# Patient Record
Sex: Male | Born: 1985 | Race: Black or African American | Hispanic: No | Marital: Single
Health system: Southern US, Academic
[De-identification: ages and names within clinical notes are randomized; demographics above are authoritative.]

## PROBLEM LIST (undated history)

## (undated) ENCOUNTER — Encounter

## (undated) ENCOUNTER — Ambulatory Visit: Payer: MEDICARE

## (undated) ENCOUNTER — Telehealth

## (undated) ENCOUNTER — Ambulatory Visit

## (undated) ENCOUNTER — Encounter: Attending: Registered" | Primary: Registered"

## (undated) ENCOUNTER — Encounter: Attending: Vascular & Interventional Radiology | Primary: Vascular & Interventional Radiology

## (undated) ENCOUNTER — Ambulatory Visit: Payer: MEDICARE | Attending: Registered" | Primary: Registered"

## (undated) ENCOUNTER — Telehealth
Attending: Student in an Organized Health Care Education/Training Program | Primary: Student in an Organized Health Care Education/Training Program

## (undated) ENCOUNTER — Encounter: Attending: Adult Health | Primary: Adult Health

## (undated) ENCOUNTER — Other Ambulatory Visit

## (undated) ENCOUNTER — Encounter: Attending: Surgery | Primary: Surgery

## (undated) ENCOUNTER — Ambulatory Visit: Payer: Medicare (Managed Care)

## (undated) ENCOUNTER — Inpatient Hospital Stay

## (undated) ENCOUNTER — Encounter: Attending: Anesthesiology | Primary: Anesthesiology

## (undated) ENCOUNTER — Telehealth: Attending: Dentist | Primary: Dentist

## (undated) ENCOUNTER — Encounter: Payer: MEDICARE | Attending: Otolaryngology | Primary: Otolaryngology

## (undated) ENCOUNTER — Encounter
Attending: Student in an Organized Health Care Education/Training Program | Primary: Student in an Organized Health Care Education/Training Program

## (undated) ENCOUNTER — Telehealth: Attending: Registered Nurse | Primary: Registered Nurse

## (undated) ENCOUNTER — Encounter
Attending: Pharmacist Clinician (PhC)/ Clinical Pharmacy Specialist | Primary: Pharmacist Clinician (PhC)/ Clinical Pharmacy Specialist

## (undated) ENCOUNTER — Ambulatory Visit: Payer: MEDICARE | Attending: Surgery | Primary: Surgery

## (undated) ENCOUNTER — Encounter: Attending: Acute Care | Primary: Acute Care

## (undated) ENCOUNTER — Ambulatory Visit: Payer: Medicare (Managed Care) | Attending: Hospice and Palliative Medicine | Primary: Hospice and Palliative Medicine

## (undated) DIAGNOSIS — F840 Autistic disorder: Secondary | ICD-10-CM

## (undated) DIAGNOSIS — R569 Unspecified convulsions: Secondary | ICD-10-CM

## (undated) DIAGNOSIS — K219 Gastro-esophageal reflux disease without esophagitis: Secondary | ICD-10-CM

## (undated) HISTORY — PX: CIRCUMCISION: SUR203

## (undated) HISTORY — DX: Autistic disorder: F84.0

## (undated) HISTORY — DX: Unspecified convulsions: R56.9

---
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
## 2007-02-10 ENCOUNTER — Ambulatory Visit: Payer: Self-pay | Admitting: Dentistry

## 2007-02-17 ENCOUNTER — Ambulatory Visit: Payer: Self-pay | Admitting: Dentistry

## 2013-04-02 DIAGNOSIS — G4700 Insomnia, unspecified: Secondary | ICD-10-CM | POA: Insufficient documentation

## 2013-04-02 DIAGNOSIS — F71 Moderate intellectual disabilities: Secondary | ICD-10-CM | POA: Insufficient documentation

## 2013-04-02 DIAGNOSIS — F840 Autistic disorder: Secondary | ICD-10-CM

## 2013-04-02 DIAGNOSIS — F6381 Intermittent explosive disorder: Secondary | ICD-10-CM | POA: Insufficient documentation

## 2013-04-02 DIAGNOSIS — G40309 Generalized idiopathic epilepsy and epileptic syndromes, not intractable, without status epilepticus: Secondary | ICD-10-CM | POA: Insufficient documentation

## 2013-04-02 DIAGNOSIS — G40209 Localization-related (focal) (partial) symptomatic epilepsy and epileptic syndromes with complex partial seizures, not intractable, without status epilepticus: Secondary | ICD-10-CM

## 2013-05-07 ENCOUNTER — Telehealth: Payer: Self-pay

## 2013-05-07 DIAGNOSIS — G40309 Generalized idiopathic epilepsy and epileptic syndromes, not intractable, without status epilepticus: Secondary | ICD-10-CM

## 2013-05-07 MED ORDER — FELBATOL 600 MG/5ML PO SUSP: ORAL | Status: DC

## 2013-05-07 NOTE — Telephone Encounter (Signed)
I called Darlene and let her know.

## 2013-05-07 NOTE — Telephone Encounter (Signed)
Darlene lvm at 10:17 am inquiring about the Rx refill. I will call mom when complete [phone].

## 2013-05-07 NOTE — Telephone Encounter (Signed)
Please let Mom know that the Rx has been faxed to the pharmacy. Thanks, Inetta Fermo

## 2013-05-18 ENCOUNTER — Encounter: Payer: Self-pay | Admitting: Pediatrics

## 2013-05-18 ENCOUNTER — Ambulatory Visit (INDEPENDENT_AMBULATORY_CARE_PROVIDER_SITE_OTHER): Payer: Medicaid Other | Admitting: Pediatrics

## 2013-05-18 VITALS — BP 84/64 | HR 72 | Ht 64.0 in | Wt 120.0 lb

## 2013-05-18 DIAGNOSIS — F71 Moderate intellectual disabilities: Secondary | ICD-10-CM

## 2013-05-18 DIAGNOSIS — G40209 Localization-related (focal) (partial) symptomatic epilepsy and epileptic syndromes with complex partial seizures, not intractable, without status epilepticus: Secondary | ICD-10-CM

## 2013-05-18 DIAGNOSIS — F840 Autistic disorder: Secondary | ICD-10-CM

## 2013-05-18 DIAGNOSIS — G4700 Insomnia, unspecified: Secondary | ICD-10-CM

## 2013-05-18 DIAGNOSIS — G40309 Generalized idiopathic epilepsy and epileptic syndromes, not intractable, without status epilepticus: Secondary | ICD-10-CM

## 2013-05-18 MED ORDER — HALOPERIDOL 2 MG PO TABS: ORAL_TABLET | ORAL | Status: DC

## 2013-05-18 MED ORDER — FELBATOL 600 MG/5ML PO SUSP: ORAL | Status: DC

## 2013-05-18 NOTE — Progress Notes (Signed)
Patient: Jonathan Kirby MRN: [id number] Sex: male DOB: [birth year]  Provider: Deetta Perla, MD Location of Care: [facility]  Note type: Routine return visit  History of Present Illness: Referral Source: Dr. [name] History from: both parents and [facility] chart Chief Complaint: Seizures/Autism  Jonathan Kirby is a 27 y.o. male who returns for evaluation and management of a mixed seizure type, and undifferentiated autism.  He was seen on May 18, 2013, for the first time since June 09, 2012.  I see him on a yearly basis.  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  Since his last visit he had two seizures in the past year.  Both were associated with forgetting to give his medication in a mix-up in communication between his parents.  Overall his health has been good.  His appetite is fair.  He is not a picky eater, but he eats very little.  He feeds himself.  He can take care of toileting and  dressing himself.  He has problems and has always had difficulty with sleep.  He goes to bed between 9:30 and 10, but often will not go to sleep until much later.  Nonetheless he stays in his bed and does not disturb his parents.  He is up between 5:30 and 6.  He has very significant constipation and does not have bowel movements any more often than once a week.  His mother gives him DocuSol, an over-the-counter suppository, that helps him have bowel movements.  This is the only way that he eliminates.  Otherwise, his health has been good.  We were not able to measure his height and weight today.  So I do not really know how it has changed, but his parents say that he has changed very little.  He takes and tolerates Felbatol quite well.  He has been on this medicine for over 20 years without any adverse effects.   We long since stopped obtaining drug levels because his seizure control has been excellent.  Neither his parents nor I are willing to attempt to taper or discontinue his medication.  Review of Systems: 12 system review was remarkable for constipation and seizure  Past Medical History  Diagnosis Date  . Seizures   . Autism    Hospitalizations: no, Head Injury: no, Nervous System Infections: no, Immunizations up to date: yes  Past Medical History Comments: The patient had a mixture of complex partial seizures with secondary generalization, and myoclonic seizures.  These were quite frequent, and cause significant impairment in his function.  Once we were able to bring seizures under control Felbatol, he showed a great deal of anxiety and depression.  This has improved as he has become older.  For the most part his seizures have been well controlled on Felbatol.  Most recurrent seizures have occurred in the setting of forgotten doses of medication.  He also has problems with constipation.  Behavior History He becomes quite aggressive when he is anxious and with changes in routine.  Surgical History History reviewed. No pertinent past surgical history. He had dental surgery under anesthesia for dental caries.  Family History family history includes Alzheimer's disease in his maternal grandfather; Cancer in his maternal grandmother; Stroke in his paternal grandfather. Family History is negative migraines, seizures, cognitive impairment, blindness, deafness, birth defects, chromosomal disorder, autism.  Social History History   Social History  .  Marital Status: Single    Spouse Name: N/A    Number of Children: N/A  . Years of Education: N/A   Social History Main Topics  . Smoking status: Never Smoker   . Smokeless tobacco: Never Used  . Alcohol Use: No  . Drug Use: No  . Sexual Activity: No   Other Topics Concern  . None   Social History Narrative  . None   Living with both  parents .  His father provides care to him while his mother works.  As they get older, his parents are very concerned about his long-term care when their health declines.  Current Outpatient Prescriptions on File Prior to Visit  Medication Sig Dispense Refill  . FELBATOL 600 MG/5ML suspension Take 6 mL by mouth twice daily  375 mL  0   No current facility-administered medications on file prior to visit.   The medication list was reviewed and reconciled. All changes or newly prescribed medications were explained.  A complete medication list was provided to the patient/caregiver.  No Known Allergies  Physical Exam BP 84/64  Pulse 72  Ht 5\' 4"  (1.626 m)  Wt 120 lb (54.432 kg)  BMI 20.59 kg/m2  General: alert, well developed, well nourished, in no acute distress, black hair, brown eyes, right handed  He did not demonstrate aggression toward his parents today, he was calm.  He was not cooperative for examination. Head: normocephalic, coarse facial features Ears, Nose and Throat: Otoscopic: tympanic membranes normal .  Pharynx: oropharynx is pink without exudates or tonsillar hypertrophy. Neck: supple, full range of motion, no cranial or cervical bruits Respiratory: auscultation clear Cardiovascular: no murmurs, pulses are normal Musculoskeletal: no  apparent scoliosis, he has tight heel cords bilaterally Skin: no rashes or neurocutaneous lesions  Neurologic Exam  Mental Status: The patient is aware of my presence.  He has no language. He makes limited eye contact. He is was not distressed today nor did he show avoidance other than to cover over his face while I examined him Cranial Nerves: visual fields are full to objects brought in from the periphery;  extraocular movements are full and conjugate; pupils are round reactive to light; funduscopic examination shows positive red reflex bilaterally; he has photophobia,  symmetric facial strength; midline tongue and uvula; he turns to  localize  sound bilaterally that attenuates quickly. Motor: Normal functional strength, tone, and mass; he has independent fine motor movements, that cannot be adequately tested Sensory:  Withdrawal x4 Coordination:  no tremor on reaching for objects Gait and Station:  broad-based gait that is steady Reflexes: symmetric and diminished bilaterally; no clonus; bilateral flexor plantar responses.  Assessment 1. 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 2. Autism undifferentiated 299.00. 3. Insomnia 780.52. 4. Moderate intellectual disabilities 318.0.  Plan I refilled his Felbatol.  I also wrote a small prescription for haloperidol for times when he shows significant agitation.  His parents always give it to him before office visit so that he does not have agitation or violent behavior.  I spent 30 minutes of face-to-face time with the patient more than half of it in consultation.  I will plan to see him in a year.  His parents had no further questions.  Deetta Perla MD

## 2013-07-03 ENCOUNTER — Telehealth: Payer: Self-pay | Admitting: Family

## 2013-07-03 NOTE — Telephone Encounter (Signed)
I called instructions to patient's father. They just picked up new Rx so I asked him to call me when he needs to refill medication again and I will update the Rx at that time. He agreed with these plans. TG

## 2013-07-03 NOTE — Telephone Encounter (Signed)
I would increase the dose to 7.5 mL twice daily.  We have not gotten drug levels.  I think that he is frightened when held down and it is difficult to draw blood.

## 2013-07-03 NOTE — Telephone Encounter (Addendum)
Mom Madix Blowe left a message saying that Sharbel had a seizure at 6:20AM. She said that it was a full body seizure and that she was concerned because she thinks it was the 4th seizure he has had in a year. She wonders if Felbatol dose should increase. Mom asked to be called back at [phone]. I called and left her a message that I will call her back later. I called back and spoke with Leory's father. He said that he has not missed any doses. He said that Cashmere had a similar seizure about a month ago and had 2 seizures earlier in the year. He and Mom wonder if the dose should increase and/or if he should take the dose 3 times a day to give him better coverage. He is currently taking Felbatol 600mg /14ml - 6ml BID. I told Dad that I would discuss with Dr Sharene Skeans and call him back.  TG

## 2013-07-23 ENCOUNTER — Telehealth: Payer: Self-pay

## 2013-07-23 DIAGNOSIS — G40209 Localization-related (focal) (partial) symptomatic epilepsy and epileptic syndromes with complex partial seizures, not intractable, without status epilepticus: Secondary | ICD-10-CM

## 2013-07-23 DIAGNOSIS — G40309 Generalized idiopathic epilepsy and epileptic syndromes, not intractable, without status epilepticus: Secondary | ICD-10-CM

## 2013-07-23 MED ORDER — FELBATOL 600 MG/5ML PO SUSP: ORAL | Status: DC

## 2013-07-23 NOTE — Telephone Encounter (Signed)
Called Jonathan Kirby and lvm to let her know that Rx was sent to the pharmacy.

## 2013-07-23 NOTE — Telephone Encounter (Addendum)
Darlene lvm stating that pt needs refill on the Felbamate and that the dose was changed to 7.5 mL bid. Needs the new Rx sent to the pharmacy as discussed with Inetta Fermo in phone note from 07/03/13. Mom can be reached at [phone]

## 2014-01-02 ENCOUNTER — Other Ambulatory Visit: Payer: Self-pay

## 2014-01-02 DIAGNOSIS — G40309 Generalized idiopathic epilepsy and epileptic syndromes, not intractable, without status epilepticus: Secondary | ICD-10-CM

## 2014-01-02 DIAGNOSIS — G40209 Localization-related (focal) (partial) symptomatic epilepsy and epileptic syndromes with complex partial seizures, not intractable, without status epilepticus: Secondary | ICD-10-CM

## 2014-01-02 MED ORDER — FELBATOL 600 MG/5ML PO SUSP: ORAL | Status: DC

## 2014-05-20 ENCOUNTER — Telehealth: Payer: Self-pay | Admitting: *Deleted

## 2014-05-20 DIAGNOSIS — G40309 Generalized idiopathic epilepsy and epileptic syndromes, not intractable, without status epilepticus: Secondary | ICD-10-CM

## 2014-05-20 DIAGNOSIS — G40209 Localization-related (focal) (partial) symptomatic epilepsy and epileptic syndromes with complex partial seizures, not intractable, without status epilepticus: Secondary | ICD-10-CM

## 2014-05-20 MED ORDER — FELBATOL 600 MG/5ML PO SUSP: ORAL | Status: DC

## 2014-05-20 NOTE — Telephone Encounter (Signed)
Printed Fax, obtained signature, faxed Rx with successful transmission.

## 2014-05-28 ENCOUNTER — Ambulatory Visit: Payer: Medicaid Other | Admitting: Pediatrics

## 2014-06-19 ENCOUNTER — Other Ambulatory Visit: Payer: Self-pay | Admitting: Family

## 2014-06-19 DIAGNOSIS — F840 Autistic disorder: Secondary | ICD-10-CM

## 2014-06-19 MED ORDER — HALOPERIDOL 2 MG PO TABS: ORAL_TABLET | ORAL | Status: DC

## 2014-07-23 ENCOUNTER — Encounter: Payer: Self-pay | Admitting: Pediatrics

## 2014-07-23 ENCOUNTER — Ambulatory Visit (INDEPENDENT_AMBULATORY_CARE_PROVIDER_SITE_OTHER): Payer: Medicaid Other | Admitting: Pediatrics

## 2014-07-23 VITALS — HR 96 | Ht 64.0 in | Wt 125.0 lb

## 2014-07-23 DIAGNOSIS — F061 Catatonic disorder due to known physiological condition: Secondary | ICD-10-CM

## 2014-07-23 DIAGNOSIS — F840 Autistic disorder: Secondary | ICD-10-CM | POA: Insufficient documentation

## 2014-07-23 DIAGNOSIS — G40209 Localization-related (focal) (partial) symptomatic epilepsy and epileptic syndromes with complex partial seizures, not intractable, without status epilepticus: Secondary | ICD-10-CM

## 2014-07-23 DIAGNOSIS — G40309 Generalized idiopathic epilepsy and epileptic syndromes, not intractable, without status epilepticus: Secondary | ICD-10-CM

## 2014-07-23 MED ORDER — HALOPERIDOL 2 MG PO TABS: ORAL_TABLET | ORAL | Status: DC

## 2014-07-23 MED ORDER — FELBATOL 600 MG/5ML PO SUSP: ORAL | Status: DC

## 2014-07-23 NOTE — Progress Notes (Signed)
Patient: Jonathan Kirby MRN: [id number] Sex: male DOB: May 29, 1986  Provider: Deetta PerlaHICKLING,Solara Goodchild H, MD [facility] Care[facility]  Note type: Routine return visit  History of Present Illness: Referral Source: Dr. Harlene Saltsavid Johnson History from: both parents and [facility] Chief Complaint: Seizure/Autism  Jonathan Kirby is a 28 y.o. male who was evaluated July 23, 2014, for the first time since May 18, 2013.  He has autism spectrum disorder with impairment of speech, language, and intellectual disability.  He has a history of mixed complex partial seizures with secondary generalization, and myoclonic seizures.  His seizures have been largely quiescent since he was placed on Felbatol.  He apparently had two seizures in the past year that were associated with failure to receive his Felbatol.  Neither parent can remember a seizure that occurred when Felbatol was given compliantly.  Jonathan Kirby's appetite has been fair.  He remains a picky eater.  He is a light sleeper, but will stay in bed and does not bother his parents.  Overall, he has no new health issues.  Review of Systems: 12 system review was unremarkable  Past Medical History Diagnosis Date  . Seizures   . Autism    Hospitalizations: No., Head Injury: No., Nervous System Infections: No., Immunizations up to date: Yes.    He had a mixture of complex partial seizures with secondary generalization, and myoclonic seizures. These were quite frequent, and caused significant impairment in his function. Once we were able to bring seizures under control Felbatol, he showed a great deal of anxiety and depression. This has improved as he has become older. For the most part his seizures have been well controlled on Felbatol. Most recurrent seizures have occurred in the setting of forgotten doses of medication. He also has problems with constipation.  Behavior History He becomes quite aggressive when he is anxious and with changes  in routine.  Surgical History Procedure Laterality Date  . Circumcision  1987   Family History family history includes Alzheimer's disease in his maternal grandfather; Cancer in his maternal grandmother; Stroke in his paternal grandfather. Family history is negative for migraines, seizures, intellectual disabilities, blindness, deafness, birth defects, chromosomal disorder, or autism.  Social History . Marital Status: Single    Spouse Name: N/A    Number of Children: N/A  . Years of Education: N/A   Social History Main Topics  . Smoking status: Never Smoker   . Smokeless tobacco: Never Used  . Alcohol Use: No  . Drug Use: No  . Sexual Activity: No   Social History Narrative  Educational level special education Occupation: N/A Living with both parents  Hobbies/Interest: Enjoys listening to music, playing with balls, stuffing items in socks, pillow cases, ect.   No Known Allergies  Physical Exam Pulse 96  Ht 5\' 4"  (1.626 m)  Wt 125 lb (56.7 kg)  BMI 21.45 kg/m2  General: alert, well developed, well nourished, in no acute distress, black hair, brown eyes, right handed He did not demonstrate aggression toward his parents today, he was calm. He was not cooperative for examination. Head: normocephalic, coarse facial features Respiratory: auscultation clear Cardiovascular: no murmurs, pulses are normal Musculoskeletal: no apparent scoliosis, he has tight heel cords bilaterally Skin: no rashes or neurocutaneous lesions  Neurologic Exam   Mental Status: The patient is aware of my presence. He has no language. He makes limited eye contact. He was not distressed today.  He show avoidance by covering over his face while I examined  him, and resisting every attempt to touch him. Cranial Nerves: visual fields are full to objects brought in from the periphery; extraocular movements are full and conjugate; pupils are round reactive to light; funduscopic examination shows positive  red reflex bilaterally; he has photophobia, symmetric facial strength; he turns to localize sound bilaterally that attenuates quickly. Motor: Normal functional strength, tone, and mass; he has independent fine motor movements, that cannot be adequately tested Sensory: Withdrawal x4 Gait and Station: broad-based gait that is steady but doiplegic Reflexes: symmetric and diminished bilaterally; no clonus; bilateral flexor plantar responses.  Assessment 1. 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 2. 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 3. 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  Discussion Jonathan Kirby has significant impairment from his autism that involves language and cognition.  When frustrated, he can become violent.  Today, he was not as sedated as he ordinarily is and his parents warned me "to not push him too hard".  It was clear that he did not want to be examined, which made examination difficult.  Plan I refilled Felbatol and we will do so again in six months' time.  I refilled prescriptions for Felbatol.  I will plan to see him in a year.  I spent 30-minutes of face-to-face time with Jonathan Kirby and his parents more than half of it in consultation.   Medication List   This list is accurate as of: 07/23/14  3:31 PM.       FELBATOL 600 MG/5ML suspension  Generic drug:  felbamate  Take 7+1/2 mL by mouth twice daily     FELBATOL 600 MG/5ML suspension  Generic drug:  felbamate  Take by mouth.     haloperidol 2 MG tablet  Commonly known as:  HALDOL  Take 2 mg by mouth.     haloperidol 2 MG tablet  Commonly known as:  HALDOL  Take one tablet by mouth as needed for agitation     oxyCODONE 5 MG/5ML solution  Commonly known as:  ROXICODONE  Take 5 mg by mouth.      The medication list was reviewed and reconciled. All changes or newly prescribed medications were explained.  A complete medication list was  provided to the patient/caregiver.  Deetta PerlaWilliam H Myeisha Kruser MD

## 2014-07-24 ENCOUNTER — Encounter: Payer: Self-pay | Admitting: Pediatrics

## 2015-01-17 ENCOUNTER — Telehealth: Payer: Self-pay | Admitting: *Deleted

## 2015-01-17 NOTE — Telephone Encounter (Signed)
We give 473 mL which is what is in one bottle.  I don't know if the pharmacy shorting the prescription.This should be enough if the amount is exactly measured for 31 days of treatment +1 dose.  Mom and dad have to make certain that they are exactly giving 7-1/2 mL, no more and no less.

## 2015-01-17 NOTE — Telephone Encounter (Signed)
Darlene, mom, is calling for a refill on Felbatol.  She states the last time the Rx was called in they started getting one bottle instead of two and she is wondering if the dosing has changed.  They are having a hard time getting the one bottle to last a whole 30 days.  She can be reached at [phone].

## 2015-03-06 ENCOUNTER — Other Ambulatory Visit: Payer: Self-pay

## 2015-03-06 DIAGNOSIS — G40209 Localization-related (focal) (partial) symptomatic epilepsy and epileptic syndromes with complex partial seizures, not intractable, without status epilepticus: Secondary | ICD-10-CM

## 2015-03-06 DIAGNOSIS — G40309 Generalized idiopathic epilepsy and epileptic syndromes, not intractable, without status epilepticus: Secondary | ICD-10-CM

## 2015-03-06 MED ORDER — FELBATOL 600 MG/5ML PO SUSP: ORAL | Status: DC

## 2015-08-01 ENCOUNTER — Encounter: Payer: Self-pay | Admitting: Pediatrics

## 2015-08-01 ENCOUNTER — Ambulatory Visit (INDEPENDENT_AMBULATORY_CARE_PROVIDER_SITE_OTHER): Payer: Medicaid Other | Admitting: Pediatrics

## 2015-08-01 VITALS — BP 100/64 | HR 84 | Ht 64.0 in | Wt 110.0 lb

## 2015-08-01 DIAGNOSIS — F061 Catatonic disorder due to known physiological condition: Secondary | ICD-10-CM

## 2015-08-01 DIAGNOSIS — G40209 Localization-related (focal) (partial) symptomatic epilepsy and epileptic syndromes with complex partial seizures, not intractable, without status epilepticus: Secondary | ICD-10-CM | POA: Diagnosis not present

## 2015-08-01 DIAGNOSIS — G4700 Insomnia, unspecified: Secondary | ICD-10-CM | POA: Diagnosis not present

## 2015-08-01 DIAGNOSIS — F71 Moderate intellectual disabilities: Secondary | ICD-10-CM | POA: Diagnosis not present

## 2015-08-01 DIAGNOSIS — F840 Autistic disorder: Secondary | ICD-10-CM | POA: Diagnosis not present

## 2015-08-01 DIAGNOSIS — G40309 Generalized idiopathic epilepsy and epileptic syndromes, not intractable, without status epilepticus: Secondary | ICD-10-CM

## 2015-08-01 MED ORDER — FELBATOL 600 MG/5ML PO SUSP: ORAL | Status: DC

## 2015-08-01 MED ORDER — HALOPERIDOL 2 MG PO TABS: ORAL_TABLET | ORAL | Status: DC

## 2015-08-01 NOTE — Progress Notes (Signed)
Patient: Jonathan Kirby MRN: [id number] Sex: male DOB: 1986-03-21  Provider: Deetta Perla, MD Location of Care: [facility]  Note type: Routine return visit  History of Present Illness: Referral Source: Jonathan Kirby History from: both parents and Jonathan Kirby chart Chief Complaint: Seizure/Autism  Jonathan Kirby is a 29 y.o. male who was evaluated August 01, 2015 for the first time since July 23, 2014. He has complex partial seizures with evolving to secondary generalized seizures and a history of myoclonic seizures.  These were brought under good control with the medicine Felbatol.  He has autism spectrum disorder with intellectual disability and delayed receptive and expressive language.  He takes and tolerates Felbatol.  He also receives haloperidol at times when he is going to travel such as today's visit.  This calms him down greatly.  He has had one seizure since his last visit.  He had some facial twitching that lasted for about a minute without postictal drowsiness, this occurred several months ago.  He has a stable bed hour, but will often remain in bed awake.  His appetite is variable.  He has not had any severe illnesses.  He stays at home with his father who provides care to him.  Review of Systems: 12 system review was unremarkable  Past Medical History Diagnosis Date  . Seizures (HCC)   . Autism    Hospitalizations: No., Head Injury: No., Nervous System Infections: No., Immunizations up to date: Yes.    He had a mixture of complex partial seizures with secondary generalization, and myoclonic seizures. These were quite frequent, and caused significant impairment in his function. Once we were able to bring seizures under control Felbatol, he showed a great deal of anxiety and depression. This has improved as he has become older. For the most part his seizures have been well controlled on Felbatol. Most recurrent seizures have occurred in the  setting of forgotten doses of medication. He also has problems with constipation.  Behavior History He becomes quite aggressive when he is anxious and with changes in routine  Surgical History Procedure Laterality Date  . Circumcision  1987   Family History family history includes Alzheimer's disease in his maternal grandfather; Cancer in his maternal grandmother; Stroke in his paternal grandfather. Family history is negative for migraines, seizures, intellectual disabilities, blindness, deafness, birth defects, chromosomal disorder, or autism.  Social History . Marital Status: Single    Spouse Name: N/A  . Number of Children: N/A  . Years of Education: N/A   Social History Main Topics  . Smoking status: Current Every Day Smoker -- 2.00 packs/day  . Smokeless tobacco: Never Used  . Alcohol Use: No  . Drug Use: No  . Sexual Activity: No   Social History Narrative    Jonathan Kirby does not attend any school or day program at this time. He lives with his parents. He enjoys listening to music, playing with remote control cars, and collecting newspapers.   No Known Allergies  Physical Exam BP 100/64 mmHg  Pulse 84  Ht  (1.626 m)  Wt 110 lb (49.896 kg)  BMI 18.87 kg/m2  General: alert, well developed, well nourished, in no acute distress, black hair, brown eyes, right handed He did not demonstrate aggression toward his parents today, he was calm. He was not cooperative for examination. Head: normocephalic, coarse facial features Respiratory: auscultation clear Cardiovascular: no murmurs, pulses are normal Musculoskeletal: no apparent scoliosis, he has tight heel cords bilaterally Skin: no  rashes or neurocutaneous lesions  Neurologic Exam  Mental Status: The patient is aware of my presence. He has no language. He makes limited eye contact. He was not distressed today. He show avoidance by covering over his face while I examined him, and resisting every attempt to touch  him. Cranial Nerves: visual fields are full to objects brought in from the periphery; extraocular movements are full and conjugate; pupils are round reactive to light; funduscopic examination shows positive red reflex bilaterally; he has photophobia, symmetric facial strength; he turns to localize sound bilaterally that attenuates quickly. Motor: Normal functional strength, tone, and mass; he has independent fine motor movements, that cannot be adequately tested Sensory: Withdrawal x4 Gait and Station: broad-based gait that is steady but diplegic Reflexes: symmetric and diminished bilaterally; no clonus; bilateral flexor plantar responses.  Assessment 1. Generalized convulsive epilepsy, G40.309. 2. Partial epilepsy with impairment of consciousness, G40.209. 3. Autism spectrum disorder requiring substantial support (level 2), F84.0.  Discussion I am pleased that Jonathan Kirby is stable.  There is no reason to change his medications, both of which were refilled today.  Plan He will return to see me in one year, I will see him sooner depending upon clinical need.  I spent 30 minutes of face-to-face time with Jonathan Kirby and his parents, more than half of it in consultation.   Medication List   This list is accurate as of: 08/01/15 12:14 PM.  Always use your most recent med list.       FELBATOL 600 MG/5ML suspension  Generic drug:  felbamate  Take 7+1/2 mL by mouth twice daily     haloperidol 2 MG tablet  Commonly known as:  HALDOL  Take one tablet by mouth as needed for agitation      The medication list was reviewed and reconciled. All changes or newly prescribed medications were explained.  A complete medication list was provided to the patient/caregiver.  Jonathan PerlaWilliam H Hickling MD

## 2016-01-08 ENCOUNTER — Other Ambulatory Visit: Payer: Self-pay

## 2016-01-08 DIAGNOSIS — G40209 Localization-related (focal) (partial) symptomatic epilepsy and epileptic syndromes with complex partial seizures, not intractable, without status epilepticus: Secondary | ICD-10-CM

## 2016-01-08 DIAGNOSIS — G40309 Generalized idiopathic epilepsy and epileptic syndromes, not intractable, without status epilepticus: Secondary | ICD-10-CM

## 2016-01-08 MED ORDER — FELBATOL 600 MG/5ML PO SUSP: ORAL | Status: DC

## 2016-01-08 NOTE — Telephone Encounter (Signed)
Rx faxed to pharmacy. TG 

## 2016-01-08 NOTE — Telephone Encounter (Signed)
Pharmacy sent over a Fax for a refill of the Felbatol 600mg /315mL.  [phone]

## 2016-04-02 DIAGNOSIS — Z0279 Encounter for issue of other medical certificate: Secondary | ICD-10-CM

## 2016-07-15 ENCOUNTER — Other Ambulatory Visit (INDEPENDENT_AMBULATORY_CARE_PROVIDER_SITE_OTHER): Payer: Self-pay | Admitting: Pediatrics

## 2016-07-15 DIAGNOSIS — G40309 Generalized idiopathic epilepsy and epileptic syndromes, not intractable, without status epilepticus: Secondary | ICD-10-CM

## 2016-07-15 DIAGNOSIS — G40209 Localization-related (focal) (partial) symptomatic epilepsy and epileptic syndromes with complex partial seizures, not intractable, without status epilepticus: Secondary | ICD-10-CM

## 2016-08-03 ENCOUNTER — Encounter (INDEPENDENT_AMBULATORY_CARE_PROVIDER_SITE_OTHER): Payer: Self-pay | Admitting: Pediatrics

## 2016-08-03 ENCOUNTER — Ambulatory Visit (INDEPENDENT_AMBULATORY_CARE_PROVIDER_SITE_OTHER): Payer: Medicaid Other | Admitting: Pediatrics

## 2016-08-03 VITALS — BP 120/70 | HR 88 | Ht 65.0 in | Wt 120.0 lb

## 2016-08-03 DIAGNOSIS — F6381 Intermittent explosive disorder: Secondary | ICD-10-CM | POA: Diagnosis not present

## 2016-08-03 DIAGNOSIS — G40209 Localization-related (focal) (partial) symptomatic epilepsy and epileptic syndromes with complex partial seizures, not intractable, without status epilepticus: Secondary | ICD-10-CM

## 2016-08-03 DIAGNOSIS — F840 Autistic disorder: Secondary | ICD-10-CM | POA: Diagnosis not present

## 2016-08-03 DIAGNOSIS — G40309 Generalized idiopathic epilepsy and epileptic syndromes, not intractable, without status epilepticus: Secondary | ICD-10-CM | POA: Diagnosis not present

## 2016-08-03 DIAGNOSIS — F061 Catatonic disorder due to known physiological condition: Secondary | ICD-10-CM | POA: Diagnosis not present

## 2016-08-03 MED ORDER — FELBATOL 600 MG/5ML PO SUSP: ORAL | 5 refills | Status: DC

## 2016-08-03 MED ORDER — HALOPERIDOL 2 MG PO TABS: ORAL_TABLET | ORAL | 5 refills | Status: DC

## 2016-08-03 NOTE — Progress Notes (Signed)
Patient: Jonathan Kirby MRN: [id number] Sex: male DOB: [birth year]  Provider: Deetta PerlaHICKLING,WILLIAM H, MD [facility]  Note type: Routine return visit  History of Present Illness: Referral Source: Dr. Harlene Saltsavid Johnson History from: both parents, patient and [facility] chart Chief Complaint: Seizures/Autism  Jonathan AngelKevin L Probert is a 30 y.o. male who returns on August 03, 2016 for the first time since August 01, 2015.  He has autism spectrum disorder with intellectual disability and delayed global language disorder.  He has a history of complex partial seizures evolving to secondary generalized seizures and also myoclonic seizures all of which were brought under control with Felbatol.  Since his last visit there have been no seizures.  He takes and tolerates his Felbatol.  He becomes agitated when he is in environment with great activity or loud sound.  Haloperidol works about 50% of the time to make him sleepy.  It was interesting today that he appeared to be very drowsy because of the medication, but as soon as we finished examining and I left the room,  he got up started to wave outside the window at the passing cars, and was only mildly subdued.  He continues to show periods of agitation that are inexplicable.  Appetite is variable.  His parents tried to enroll him in a day program, but he became very upset when he was placed in the class and had to be removed.  He loves to play with paper and will tear it into small pieces.  He sleeps most nights, but on occasion has arousal around 3 in the morning.  When that happens, he is rarely up more than half hour.  He tends to sleep between 9:30 and 10 and 6:30 and 7 in the morning.  His only other medical problem is constipation.  His health is good.  His parents had no other concerns today.  Review of Systems: 12 system review was remarkable for halodol works half the time, sensitive to sound; the remainder was assessed and was  negative  Past Medical History Diagnosis Date  . Autism   . Seizures (HCC)    Hospitalizations: No., Head Injury: No., Nervous System Infections: No., Immunizations up to date: Yes.    He had a mixture of complex partial seizures with secondary generalization, and myoclonic seizures. These were quite frequent, and caused significant impairment in his function. Once we were able to bring seizures under control Felbatol, he showed a great deal of anxiety and depression. This has improved as he has become older. For the most part his seizures have been well controlled on Felbatol. Most recurrent seizures have occurred in the setting of forgotten doses of medication. He also has problems with constipation.  Behavior History autism spectrum disorder that; he becomes aggressive when he is anxious and with changes in routine.  Surgical History Procedure Laterality Date  . CIRCUMCISION  1987   Family History family history includes Alzheimer's disease in his maternal grandfather; Cancer in his maternal grandmother; Stroke in his paternal grandfather. Family history is negative for migraines, seizures, intellectual disabilities, blindness, deafness, birth defects, chromosomal disorder, or autism.  Social History . Marital status: Single    Spouse name: N/A  . Number of children: N/A  . Years of education: N/A   Social History Main Topics  . Smoking status: Never Smoker  . Smokeless tobacco: Never Used  . Alcohol use No  . Drug use: No  . Sexual activity: No   Social  History Narrative    [name] does not attend any school or day program at this time.     He lives with his parents.     He enjoys listening to music, playing with remote control cars, and collecting newspapers.   No Known Allergies  Physical Exam BP 120/70   Pulse 88   Ht 5\' 5"  (1.651 m)   Wt 120 lb (54.4 kg)   BMI 19.97 kg/m   General: alert, well developed, well nourished, in no acute distress, black  hair, brown eyes, right handed Head: normocephalic, no dysmorphic features Ears, Nose and Throat: Otoscopic: tympanic membranes normal; pharynx: oropharynx is pink without exudates or tonsillar hypertrophy Neck: supple, full range of motion, no cranial or cervical bruits Respiratory: auscultation clear Cardiovascular: no murmurs, pulses are normal Musculoskeletal: no skeletal deformities or apparent scoliosis Skin: no rashes or neurocutaneous lesions  Neurologic Exam  Mental Status: alert; sleepy, however he became alert rather quickly after I left the room; he does not follow commands nor does he speak; he makes no eye contact, in fact he avoids it Cranial Nerves: visual fields are full to double simultaneous stimuli; extraocular movements are full and conjugate; pupils are round reactive to light; funduscopic examination shows sharp disc margins with normal vessels; symmetric facial strength; midline tongue and uvula; air conduction is greater than bone conduction bilaterally Motor: Normal strength, tone and mass; good fine motor movements; no pronator drift Sensory: intact responses to cold, vibration, proprioception and stereognosis Coordination: good finger-to-nose, rapid repetitive alternating movements and finger apposition Gait and Station: normal gait and station: patient is able to walk on heels, toes and tandem without difficulty; balance is adequate; Romberg exam is negative; Gower response is negative Reflexes: symmetric and diminished bilaterally; no clonus; bilateral flexor plantar responses  Assessment 1. Generalized convulsive epilepsy, G40.309. 2. Partial epilepsy with impairment of consciousness, G40.209. 3. Autism spectrum disorder requiring substantial support (level 2), F84.0. 4. Intermittent explosive disorder, F63.81.  Discussion [name] is medically and neurologically stable.  There is no reason to make any changes in his medication.    Plan I refilled  prescriptions for Felbatol and also haloperidol.  I spent 30 minutes of face-to-face time with [name] and his parents.  He will return to see me in one year's time.   Medication List   Accurate as of 08/03/16  3:24 PM.      FELBATOL 600 MG/5ML suspension Generic drug:  felbamate GIVE 7.5ML BY MOUTH TWICE DAILY AS DIRECTED.   haloperidol 2 MG tablet Commonly known as:  HALDOL Take one tablet by mouth as needed for agitation     The medication list was reviewed and reconciled. All changes or newly prescribed medications were explained.  A complete medication list was provided to the patient/caregiver.  Deetta PerlaWilliam H Hickling MD

## 2017-02-25 ENCOUNTER — Other Ambulatory Visit (INDEPENDENT_AMBULATORY_CARE_PROVIDER_SITE_OTHER): Payer: Self-pay | Admitting: Pediatrics

## 2017-02-25 DIAGNOSIS — G40309 Generalized idiopathic epilepsy and epileptic syndromes, not intractable, without status epilepticus: Secondary | ICD-10-CM

## 2017-02-25 DIAGNOSIS — G40209 Localization-related (focal) (partial) symptomatic epilepsy and epileptic syndromes with complex partial seizures, not intractable, without status epilepticus: Secondary | ICD-10-CM

## 2017-07-25 ENCOUNTER — Encounter (INDEPENDENT_AMBULATORY_CARE_PROVIDER_SITE_OTHER): Payer: Self-pay | Admitting: Pediatrics

## 2017-07-25 ENCOUNTER — Ambulatory Visit (INDEPENDENT_AMBULATORY_CARE_PROVIDER_SITE_OTHER): Payer: Medicaid Other | Admitting: Pediatrics

## 2017-07-25 VITALS — Ht 65.0 in | Wt 120.0 lb

## 2017-07-25 DIAGNOSIS — G40309 Generalized idiopathic epilepsy and epileptic syndromes, not intractable, without status epilepticus: Secondary | ICD-10-CM

## 2017-07-25 DIAGNOSIS — H93239 Hyperacusis, unspecified ear: Secondary | ICD-10-CM | POA: Diagnosis not present

## 2017-07-25 DIAGNOSIS — F840 Autistic disorder: Secondary | ICD-10-CM

## 2017-07-25 DIAGNOSIS — F061 Catatonic disorder due to known physiological condition: Secondary | ICD-10-CM | POA: Diagnosis not present

## 2017-07-25 DIAGNOSIS — H93299 Other abnormal auditory perceptions, unspecified ear: Secondary | ICD-10-CM

## 2017-07-25 DIAGNOSIS — F6381 Intermittent explosive disorder: Secondary | ICD-10-CM | POA: Diagnosis not present

## 2017-07-25 DIAGNOSIS — G40209 Localization-related (focal) (partial) symptomatic epilepsy and epileptic syndromes with complex partial seizures, not intractable, without status epilepticus: Secondary | ICD-10-CM

## 2017-07-25 DIAGNOSIS — H93293 Other abnormal auditory perceptions, bilateral: Secondary | ICD-10-CM | POA: Insufficient documentation

## 2017-07-25 MED ORDER — HALOPERIDOL 2 MG PO TABS: ORAL_TABLET | ORAL | 5 refills | Status: DC

## 2017-07-25 MED ORDER — FELBATOL 600 MG/5ML PO SUSP: ORAL | 5 refills | Status: DC

## 2017-07-25 NOTE — Progress Notes (Signed)
Patient: Jonathan Kirby MRN: [id number] Sex: male DOB: 1986-04-18  Provider: Ellison CarwinWilliam Elliet Goodnow, MD [facility]  Note type: Routine return visit  History of Present Illness: Referral Source: Dr. Harlene Saltsavid Johnson History from: both parents, patient and [facility] Chief Complaint: Seizures/Autism  Jonathan Kirby is a 31 y.o. male who returns on July 25, 2017, for the first time since August 03, 2016.  He has autism spectrum disorder with intellectual disability and severe language delays.  He has focal epilepsy with impairment of consciousness evolving to secondary generalized seizures and also myoclonic seizures.  Felbatol has controlled this extremely well.  He has had no seizures in the past year.  He takes and tolerates Felbatol without side effects.  He has what appears to be misophonia.  This is associated with certain words and sounds that he does not like and that upset him.  He will grunt and stomp his feet.  If these sounds continue, then he will have a full-blown tantrum, but his parents know how to prevent that.  He enjoys music, in particular [employer] and [address] music.  These tend to soothe him.  He does not like watching TV.  I have never seen this problem of misophonia in a child who has very significant language delays and would not be necessarily expected to discriminate sounds that seem to irritate him greatly.  He is here today for routine followup.  He goes to bed around 9:30 to 10:00 and all but 2 or 3 times per month sleeps until 6:30.  When he awakens, it usually is between 3:00 and 3:30, and he is up for a while before he can go back to sleep.  Review of Systems: A complete review of systems was remarkable for sound sensitivity has worsened, all other systems reviewed and negative.  Past Medical History Diagnosis Date  . Autism   . Seizures (HCC)    Hospitalizations: No., Head Injury: No., Nervous System Infections: No.,  Immunizations up to date: Yes.    He had a mixture of complex partial seizures with secondary generalization, and myoclonic seizures. These were quite frequent, and caused significant impairment in his function. Once we were able to bring seizures under control Felbatol, he showed a great deal of anxiety and depression. This has improved as he has become older. For the most part his seizures have been well controlled on Felbatol. Most recurrent seizures have occurred in the setting of forgotten doses of medication. He also has problems with constipation.  Behavior History autism spectrum disorder that; he becomes aggressive when he is anxious and with changes in routine.  Surgical History Procedure Laterality Date  . CIRCUMCISION  1987   Family History family history includes Alzheimer's disease in his maternal grandfather; Cancer in his maternal grandmother; Stroke in his paternal grandfather. Family history is negative for migraines, seizures, intellectual disabilities, blindness, deafness, birth defects, chromosomal disorder, or autism.  Social History Socioeconomic History  . Marital status: Single    Spouse name: None  . Number of children: None  . Years of education: 7413  . Highest education level: HS  Social Needs  . Financial resource strain: None  . Food insecurity - worry: None  . Food insecurity - inability: None  . Transportation needs - medical: None  . Transportation needs - non-medical: None  Tobacco Use  . Smoking status: Never Smoker  . Smokeless tobacco: Never Used  Substance and Sexual Activity  . Alcohol use: No  Alcohol/week: 0.0 oz  . Drug use: No  . Sexual activity: No  Social History Narrative    Caeson does not attend any school or day program at this time.     He lives with his parents.     He enjoys listening to music, playing with remote control cars, and collecting newspapers.   No Known Allergies  Physical Exam Ht 5\' 5"  (1.651 m)    Wt 120 lb (54.4 kg)   BMI 19.97 kg/m   General: alert, well developed, thin, in no acute distress, black hair, brown eyes, right handed Head: normocephalic, no dysmorphic features Ears, Nose and Throat: Otoscopic: tympanic membranes normal; pharynx: oropharynx is pink without exudates or tonsillar hypertrophy Neck: supple, full range of motion, no cranial or cervical bruits Respiratory: auscultation clear Cardiovascular: no murmurs, pulses are normal Musculoskeletal: no skeletal deformities or apparent scoliosis Skin: no rashes or neurocutaneous lesions  Neurologic Exam  Mental Status: sleepy/sedated; does not follow commands, hides his eyes with his hands and resist trying to move them Cranial Nerves: visual fields are full to double simultaneous stimuli; extraocular movements are full and conjugate; pupils are round reactive to light; funduscopic examination shows positive red reflex bilaterally; symmetric facial strength, and passive; midline tongue; turns to localize bilaterally Motor: normal functional strength, tone and mass; clumsy fine motor movements; cannot test pronator drift Sensory: withdrawal x4 Coordination: cannot test Gait and Station: diplegic gait and station; balance is fair; Gower response is negative Reflexes: symmetric and diminished bilaterally; no clonus; bilateral flexor plantar responses  Assessment 1. 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 2. Partial epilepsy with impairment of consciousness, G40.209. 3. 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 5. Intermittent explosive disorder, F63.81.  Discussion Jonathan Kirby is doing well.  He is at baseline.  I am pleased that his seizures are under control.  I am concerned that he has such an aversion to sounds.  He did not seem to have a problem with that today, but he had been sedated with Haldol.  He was resistant to my examination of  him.  I spent 15 minutes of face-to-face time with [name] and his parents.  We discussed primarily his misophonia but alos his autism, seizure control, antiepileptic medication, and behavior.  They provided history and we agreed that there is no need to change his medicine.  Plan I refilled prescriptions for Felbatol.  This is a brand medication that has to be written and marked brand name medically necessary.  I also refilled a prescription for haloperidol and electronically sent it to his pharmacy.  He will return to see me in 1 year's time.  I will see him sooner based on clinical need.   Medication List    Accurate as of 07/25/17  2:41 PM.      felbamate 600 MG/5ML suspension Commonly known as:  FELBATOL TAKE 7.5 ML BY MOUTH TWICE DAILY AS DIRECTED   haloperidol 2 MG tablet Commonly known as:  HALDOL Take 1 - 1 1/2 tablets by mouth as needed for agitation    The medication list was reviewed and reconciled. All changes or newly prescribed medications were explained.  A complete medication list was provided to the patient/caregiver.  Deetta Perla MD

## 2018-03-07 ENCOUNTER — Telehealth (INDEPENDENT_AMBULATORY_CARE_PROVIDER_SITE_OTHER): Payer: Self-pay | Admitting: Pediatrics

## 2018-03-07 NOTE — Telephone Encounter (Signed)
I suggested seeing if Haldol was available as a substitute.  I asked mom to call me if that was not possible.

## 2018-03-07 NOTE — Telephone Encounter (Signed)
°  Who's calling (name and relationship to patient) : Agustin CreeDarlene (Mother) Best contact number: [phone] Provider they see: Dr. Sharene SkeansHickling Reason for call: Mom stated Haloperidol rx is on back order. She has checked at both pt's preferred pharmacy ([facility] on [address] [place]) and at another [facility] on [address] in [place]). Mom wants to know if there is an alternative rx that pt can take or is there a specific pharmacy that has the medication in stock. Please advise.      PRESCRIPTION REFILL ONLY  Name of prescription: Haloperidol Pharmacy: [facility] on [address] [place]

## 2018-03-10 ENCOUNTER — Encounter (INDEPENDENT_AMBULATORY_CARE_PROVIDER_SITE_OTHER): Payer: Self-pay | Admitting: Pediatrics

## 2018-03-10 DIAGNOSIS — G40209 Localization-related (focal) (partial) symptomatic epilepsy and epileptic syndromes with complex partial seizures, not intractable, without status epilepticus: Secondary | ICD-10-CM

## 2018-03-10 DIAGNOSIS — G40309 Generalized idiopathic epilepsy and epileptic syndromes, not intractable, without status epilepticus: Secondary | ICD-10-CM

## 2018-03-10 MED ORDER — FELBATOL 600 MG/5ML PO SUSP: ORAL | 5 refills | Status: DC

## 2018-03-10 MED ORDER — FELBAMATE 600 MG/5ML PO SUSP: ORAL | 5 refills | Status: DC

## 2018-03-10 NOTE — Telephone Encounter (Signed)
Please send rx to the pharmacy. Also read the second part of her message

## 2018-03-10 NOTE — Telephone Encounter (Signed)
I left a 2-minute message with mother.  I rewrote the prescription for Felbatol not generic felbamate.  I have been told by the pharmacy that when I send trade drug, that it still can be changed over to generic unless I write it.  Obviously that is very frustrating.  Please fax this to the pharmacy on Monday.  I told mother to call back so we could talk about giving him Risperdal as opposed to haloperidol.

## 2018-03-12 ENCOUNTER — Encounter (INDEPENDENT_AMBULATORY_CARE_PROVIDER_SITE_OTHER): Payer: Self-pay | Admitting: Pediatrics

## 2018-03-13 MED ORDER — HALOPERIDOL 5 MG PO TABS: ORAL_TABLET | ORAL | 1 refills | Status: DC

## 2018-04-07 ENCOUNTER — Other Ambulatory Visit (INDEPENDENT_AMBULATORY_CARE_PROVIDER_SITE_OTHER): Payer: Self-pay | Admitting: Pediatrics

## 2018-04-07 DIAGNOSIS — G40209 Localization-related (focal) (partial) symptomatic epilepsy and epileptic syndromes with complex partial seizures, not intractable, without status epilepticus: Secondary | ICD-10-CM

## 2018-04-07 DIAGNOSIS — G40309 Generalized idiopathic epilepsy and epileptic syndromes, not intractable, without status epilepticus: Secondary | ICD-10-CM

## 2018-04-07 NOTE — Telephone Encounter (Signed)
They need to call around to other pharmacies to see if they can get it in sooner. If it is a production problem, please call the representative for Felbamate and/or the [facility] [place] for an emergency supply.  There are no similar medications.    Lorenz CoasterStephanie Keiffer Piper MD MPH

## 2018-04-07 NOTE — Telephone Encounter (Signed)
Noted  

## 2018-04-07 NOTE — Telephone Encounter (Signed)
[facility] stating that this medication will not be available until 05/08/18. What needs to be done?

## 2018-07-25 ENCOUNTER — Encounter (INDEPENDENT_AMBULATORY_CARE_PROVIDER_SITE_OTHER): Payer: Self-pay | Admitting: Pediatrics

## 2018-07-25 ENCOUNTER — Ambulatory Visit (INDEPENDENT_AMBULATORY_CARE_PROVIDER_SITE_OTHER): Payer: Medicaid Other | Admitting: Pediatrics

## 2018-07-25 VITALS — Ht 65.0 in | Wt 120.0 lb

## 2018-07-25 DIAGNOSIS — G478 Other sleep disorders: Secondary | ICD-10-CM | POA: Insufficient documentation

## 2018-07-25 DIAGNOSIS — G4700 Insomnia, unspecified: Secondary | ICD-10-CM

## 2018-07-25 DIAGNOSIS — F840 Autistic disorder: Secondary | ICD-10-CM

## 2018-07-25 DIAGNOSIS — G40209 Localization-related (focal) (partial) symptomatic epilepsy and epileptic syndromes with complex partial seizures, not intractable, without status epilepticus: Secondary | ICD-10-CM | POA: Diagnosis not present

## 2018-07-25 DIAGNOSIS — G40309 Generalized idiopathic epilepsy and epileptic syndromes, not intractable, without status epilepticus: Secondary | ICD-10-CM

## 2018-07-25 MED ORDER — FELBATOL 600 MG/5ML PO SUSP: ORAL | 5 refills | Status: DC

## 2018-07-25 MED ORDER — HALOPERIDOL 5 MG PO TABS: ORAL_TABLET | ORAL | 1 refills | Status: DC

## 2018-07-25 NOTE — Patient Instructions (Signed)
We talked about compounding Felbatol if it comes to that.  It could be crushed put in something to dissolve it and a syrup can be added to it to diminish the bitter taste.

## 2018-07-25 NOTE — Progress Notes (Signed)
Patient: Jonathan Kirby MRN: [id number] Sex: male DOB: [birth year]  Provider: Ellison Carwin, MD Location of Care: [facility]  Note type: Routine return visit  History of Present Illness: Referral Source: Dr. [name] History from: both parents, patient and [facility] chart Chief Complaint: Seizures/Autism  Jonathan Kirby is a 32 y.o. male who returns on July 25, 2018, for the first time since July 25, 2017.  He has autism spectrum disorder with intellectual disability and severe language delays.  He has focal epilepsy with impairment of consciousness that evolves into secondary generalized seizures.  He also had myoclonic seizures.  Felbatol has completely controlled his seizures.  He takes Felbatol without difficulty and without side effect.  One of his mother's greatest concerns is that the pharmacy is having trouble acquiring liquid Felbatol.  I told her that we might have to provide Felbatol tablets and have them compounded.  Also, we have had to change the haloperidol from a 2-mg tablet to 5 mg because the 2 mg were not available.  In general, the patient's health has been good.  His sleep is variable.  He has frequent early arousals.  He is a picky eater.  His weight is stable.  He stays at home with his parents.  He enjoys listening to music.  His intellectual disability and language disorder precludes any meaningful outside activity.  His parents usually sedate him when they go out into public.  Review of Systems: A complete review of systems was assessed and was negative.  Past Medical History Diagnosis Date  . Autism   . Seizures (HCC)    Hospitalizations: No., Head Injury: No., Nervous System Infections: No., Immunizations up to date: Yes.    He had a mixture of complex partial seizures with secondary generalization, and myoclonic seizures. These were quite frequent, and caused significant impairment in his function. Once we were able to bring  seizures under control Felbatol, he showed a great deal of anxiety and depression. This has improved as he has become older. For the most part his seizures have been well controlled on Felbatol. Most recurrent seizures have occurred in the setting of forgotten doses of medication. He also has problems with constipation.  Behavior History autism spectrum disorder that; he becomes aggressive when he is anxious and with changes in routine.  Surgical History Procedure Laterality Date  . CIRCUMCISION  1987   Family History family history includes Alzheimer's disease in his maternal grandfather; Cancer in his maternal grandmother; Stroke in his paternal grandfather. Family history is negative for migraines, seizures, intellectual disabilities, blindness, deafness, birth defects, chromosomal disorder, or autism.  Social History Social History   Socioeconomic History  . Marital status: Single  . Years of education:  13 years  . Highest education level:  High School Certificate  Occupational History  .  Unemployed due to disability  Social Needs  . Financial resource strain: Not on file  . Food insecurity:    Worry: Not on file    Inability: Not on file  . Transportation needs:    Medical: Not on file    Non-medical: Not on file  Tobacco Use  . Smoking status: Never Smoker  . Smokeless tobacco: Never Used  Substance and Sexual Activity  . Alcohol use: No    Alcohol/week: 0.0 standard drinks  . Drug use: No  . Sexual activity: Never  Social History Narrative    Izaih does not attend any school or day program at this time.  He lives with his parents.     He enjoys listening to music, playing with remote control cars, and collecting newspapers.   No Known Allergies  Physical Exam Ht 5\' 5"  (1.651 m)   Wt 120 lb (54.4 kg)   BMI 19.97 kg/m   General: alert, well developed, well nourished, in no acute distress, black hair, brown eyes, right handed Head: normocephalic,  no dysmorphic features Ears, Nose and Throat: Otoscopic: tympanic membranes normal; pharynx: oropharynx is pink without exudates or tonsillar hypertrophy Neck: supple, full range of motion, no cranial or cervical bruits Respiratory: auscultation clear Cardiovascular: no murmurs, pulses are normal Musculoskeletal: no skeletal deformities or apparent scoliosis Skin: no rashes or neurocutaneous lesions  Neurologic Exam  Mental Status: alert; knowledge is normal for age; language is limited, he does not follow commands, does not make eye contact, and hides his eyes with his hands resisting any attempts to move them Cranial Nerves: visual fields are difficult to test but appear full to double simultaneous stimuli; extraocular movements are full and conjugate; pupils are round reactive to light; funduscopic examination shows positive red reflex bilaterally; symmetric impassive facial strength; midline tongue; inconsistently localizes sound bilaterally Motor: normal functional strength, tone and mass; unable to test fine motor movements or pronator drift Sensory: withdrawal x4 Coordination: unable to test Gait and Station: broad-based but stable gait and station; Gower response is negative Reflexes: symmetric and diminished bilaterally; no clonus; bilateral flexor plantar responses  Assessment 1. Generalized convulsive epilepsy, G40.309. 2. Partial epilepsy with impairment of consciousness, G40.209. 3. Autism spectrum disorder with accompanying language impairment and intellectual disability requiring substantial support, F84.0. 4. Insomnia, unspecified type, G47.00. 5. Sleep arousal disorder, G47.8.  Discussion Jonathan Kirby is stable.  There is no reason to change his haloperidol or his Felbatol.  Haloperidol is basically given to him when he goes out into public, and he receives typically 2.5 mg which is half of a tablet.  The Felbatol is given via liquid suspension, but as I mentioned, we could give  him tablets, crush them and place them in a suspension.  If it comes to that, I do not think he will be able to take tablets and liquid Felbatol has worked extremely well.    Plan He will return to see me in a year.  Prescriptions were issued for Felbatol trade drug and haloperidol.  His parents know to contact me if there are any other issues that surface.  Greater than 50% of a 25-minute visit was spent in counseling and coordination of care concerning his seizures, his sleep disorder, and his autism as well as the treatments for them.  He will return to see me in a year.   Medication List    Accurate as of 07/25/18 12:09 PM.      FELBATOL 600 MG/5ML suspension Generic drug:  felbamate Take 7.5 mL twice daily   haloperidol 5 MG tablet  Commonly known as:  HALDOL Take 1/2 tablet needed for agitation    The medication list was reviewed and reconciled. All changes or newly prescribed medications were explained.  A complete medication list was provided to the patient/caregiver.  Deetta Perla MD

## 2018-08-09 ENCOUNTER — Ambulatory Visit (INDEPENDENT_AMBULATORY_CARE_PROVIDER_SITE_OTHER): Payer: Medicaid Other | Admitting: Pediatrics

## 2019-02-19 ENCOUNTER — Other Ambulatory Visit (INDEPENDENT_AMBULATORY_CARE_PROVIDER_SITE_OTHER): Payer: Self-pay | Admitting: Pediatrics

## 2019-02-19 DIAGNOSIS — G40309 Generalized idiopathic epilepsy and epileptic syndromes, not intractable, without status epilepticus: Secondary | ICD-10-CM

## 2019-02-19 DIAGNOSIS — G40209 Localization-related (focal) (partial) symptomatic epilepsy and epileptic syndromes with complex partial seizures, not intractable, without status epilepticus: Secondary | ICD-10-CM

## 2019-07-24 ENCOUNTER — Other Ambulatory Visit (INDEPENDENT_AMBULATORY_CARE_PROVIDER_SITE_OTHER): Payer: Self-pay | Admitting: Pediatrics

## 2019-07-24 DIAGNOSIS — F840 Autistic disorder: Secondary | ICD-10-CM

## 2019-07-27 ENCOUNTER — Encounter (INDEPENDENT_AMBULATORY_CARE_PROVIDER_SITE_OTHER): Payer: Self-pay | Admitting: Pediatrics

## 2019-07-27 ENCOUNTER — Ambulatory Visit (INDEPENDENT_AMBULATORY_CARE_PROVIDER_SITE_OTHER): Payer: Medicaid Other | Admitting: Pediatrics

## 2019-07-27 ENCOUNTER — Other Ambulatory Visit: Payer: Self-pay

## 2019-07-27 VITALS — Wt 120.0 lb

## 2019-07-27 DIAGNOSIS — G40209 Localization-related (focal) (partial) symptomatic epilepsy and epileptic syndromes with complex partial seizures, not intractable, without status epilepticus: Secondary | ICD-10-CM | POA: Diagnosis not present

## 2019-07-27 DIAGNOSIS — G40309 Generalized idiopathic epilepsy and epileptic syndromes, not intractable, without status epilepticus: Secondary | ICD-10-CM | POA: Diagnosis not present

## 2019-07-27 DIAGNOSIS — F840 Autistic disorder: Secondary | ICD-10-CM

## 2019-07-27 DIAGNOSIS — G478 Other sleep disorders: Secondary | ICD-10-CM | POA: Diagnosis not present

## 2019-07-27 MED ORDER — HALOPERIDOL 5 MG PO TABS: ORAL_TABLET | ORAL | 5 refills | Status: DC

## 2019-07-27 MED ORDER — FELBATOL 600 MG/5ML PO SUSP: ORAL | 5 refills | Status: DC

## 2019-07-27 NOTE — Progress Notes (Signed)
This is a Pediatric Specialist E-Visit follow up consult provided via Telephone [name] and their parent/guardian Vincen Bejar consented to an E-Visit consult today.  Location of patient: [name] is at home Location of provider: Ellison Carwin, MD is in office Patient was referred by No ref. provider found   The following participants were involved in this E-Visit: parents, patient, CMA, provider  Chief Complain/ Reason for E-Visit today: Epilepsy Total time on call: 8 minutes Follow up: 6 months    Patient: Jonathan Kirby MRN: [id number] Sex: male DOB: 08/28/1986  Provider: Ellison Carwin, MD Location of Care: [facility]  Note type: Routine return visit  History of Present Illness: Referral Source: [name], MD History from: both parents, patient and [facility] chart Chief Complaint: Seizures/Autism  Jonathan Kirby is a 33 y.o. male who was evaluated on July 27, 2019, for the first time since July 25, 2018.  The patient has autism spectrum disorder with intellectual disability and severe mixed language delays.  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  Felbatol has completely controlled his seizures.  Haloperidol is used to help control his agitation.  His mother thinks that he has lost a little bit of weight because his appetite is not that good.  Nonetheless, she feels like his clothes fit in the same way.  He goes to sleep fairly well most nights.  He tends to stay up until the late evening.  On occasion, he has arousals between 3 and 4 o'clock and sometimes he is up the rest of the day.  His general health is good.  His behavior has not been problematic.  He is at home with his parents.  We had a telephone visit today because the family wanted to protect themselves and him against Coronavirus and unfortunately did not have WebEx because their Internet would  not support it.  Review of Systems: A complete review of systems was assessed and was negative  Past Medical History Diagnosis Date   Autism    Seizures (HCC)    Hospitalizations: No., Head Injury: No., Nervous System Infections: No., Immunizations up to date: Yes.    He had a mixture of complex partial seizures with secondary generalization, and myoclonic seizures. These were quite frequent, and caused significant impairment in his function. Once we were able to bring seizures under control Felbatol, he showed a great deal of anxiety and depression. This has improved as he has become older. For the most part his seizures have been well controlled on Felbatol. Most recurrent seizures have occurred in the setting of forgotten doses of medication. He also has problems with constipation.  Behavior History autism spectrum disorder that; he becomes aggressive when he is anxious and with changes in routine.  Surgical History Procedure Laterality Date   CIRCUMCISION  2   Family History family history includes Alzheimer's disease in his maternal grandfather; Cancer in his maternal grandmother; Stroke in his paternal grandfather. Family history is negative for migraines, seizures, intellectual disabilities, blindness, deafness, birth defects, chromosomal disorder, or autism.  Social History Socioeconomic History   Marital status: Single   Years of education: 13   Highest education level:  High school certificate  Occupational History   Not employed  Social Needs   Financial resource strain: Not on file   Food insecurity    Worry: Not on file    Inability: Not on file   Transportation needs  Medical: Not on file    Non-medical: Not on file  Tobacco Use   Smoking status: Never Smoker   Smokeless tobacco: Never Used  Substance and Sexual Activity   Alcohol use: No    Alcohol/week: 0.0 standard drinks   Drug use: No   Sexual activity: Never  Social  History Narrative    Marlene does not attend any school or day program at this time.     He lives with his parents.     He enjoys listening to music, playing with remote control cars, and collecting newspapers.   No Known Allergies  Physical Exam Wt 120 lb (54.4 kg)    BMI 19.97 kg/m   I was not able to examine him because this was a telephone call.  Assessment 1.  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 2.  Generalized convulsive epilepsy, G40.309. 3.  Autism spectrum disorder with accompanying language impairment and intellectual disability requiring very substantial support, level 3, F84.0. 4.  Sleep arousal disorder, G47.8.  Discussion Jonathan Kirby is physically neurologically stable.  His parents are content with his current situation.  They do not want to make any changes.   Plan He will return to see me in a year.  I will see him sooner based on clinical need.  I wrote a prescription for 6 months of haloperidol and a trade drug prescription that will be issued from our office to keep him on Felbatol.  I spent between 8 minutes speaking with his mother, discussing his autism, seizures, sleep, appetite, and general health.   Medication List   Accurate as of July 27, 2019 11:59 PM. If you have any questions, ask your nurse or doctor.    Felbatol 600 MG/5ML suspension Generic drug: felbamate Take 7.5 mL twice daily Started by: Wyline Copas, MD   haloperidol 5 MG tablet Commonly known as: HALDOL TAKE 1/2 TABLET BY MOUTH AS NEEDED AGITATION    The medication list was reviewed and reconciled. All changes or newly prescribed medications were explained.  A complete medication list was provided to the patient/caregiver.  Jodi Geralds MD

## 2019-07-28 NOTE — Patient Instructions (Signed)
I am pleased that Jonathan Kirby is doing well.  There is no reason to make any change in his medications.  I will send Felbatol through the mail.  Please let me know if there is anything else that I can do.  I hope to see you in my office in a year.

## 2019-08-01 ENCOUNTER — Ambulatory Visit (INDEPENDENT_AMBULATORY_CARE_PROVIDER_SITE_OTHER): Payer: Medicaid Other | Admitting: Pediatrics

## 2019-08-13 ENCOUNTER — Encounter (INDEPENDENT_AMBULATORY_CARE_PROVIDER_SITE_OTHER): Payer: Self-pay

## 2019-08-13 DIAGNOSIS — K029 Dental caries, unspecified: Secondary | ICD-10-CM

## 2019-08-13 DIAGNOSIS — G40309 Generalized idiopathic epilepsy and epileptic syndromes, not intractable, without status epilepticus: Secondary | ICD-10-CM

## 2019-08-13 DIAGNOSIS — F840 Autistic disorder: Secondary | ICD-10-CM

## 2019-08-18 NOTE — Telephone Encounter (Signed)
Please call and make this referral.

## 2019-08-20 ENCOUNTER — Telehealth (INDEPENDENT_AMBULATORY_CARE_PROVIDER_SITE_OTHER): Payer: Self-pay | Admitting: Pediatrics

## 2019-08-20 NOTE — Telephone Encounter (Signed)
Spoke with mom to inform her that the referral has been sent to [facility]

## 2019-08-20 NOTE — Telephone Encounter (Signed)
Referral has been sent to [facility]

## 2019-08-20 NOTE — Telephone Encounter (Signed)
°  Who's calling (name and relationship to patient) : Carlyon Shadow (mom)  Best contact number: [phone]  Provider they see: Gaynell Face   Reason for call:  Mom LVM stating no one replied to [employer].  She was wondering if Dr Gaynell Face could send a referral to [facility]. Please call.     PRESCRIPTION REFILL ONLY  Name of prescription:  Pharmacy:

## 2019-10-05 ENCOUNTER — Encounter: Admit: 2019-10-05 | Discharge: 2019-10-06 | Payer: MEDICARE

## 2019-10-05 DIAGNOSIS — F69 Unspecified disorder of adult personality and behavior: Principal | ICD-10-CM

## 2019-10-05 DIAGNOSIS — R6250 Unspecified lack of expected normal physiological development in childhood: Principal | ICD-10-CM

## 2019-10-05 DIAGNOSIS — F6381 Intermittent explosive disorder: Principal | ICD-10-CM

## 2019-10-05 DIAGNOSIS — F489 Nonpsychotic mental disorder, unspecified: Secondary | ICD-10-CM

## 2019-10-05 DIAGNOSIS — F840 Autistic disorder: Principal | ICD-10-CM

## 2019-10-05 DIAGNOSIS — G40309 Generalized idiopathic epilepsy and epileptic syndromes, not intractable, without status epilepticus: Principal | ICD-10-CM

## 2019-10-05 DIAGNOSIS — K0261 Dental caries on smooth surface limited to enamel: Principal | ICD-10-CM

## 2019-10-06 ENCOUNTER — Encounter
Admit: 2019-10-06 | Discharge: 2019-10-07 | Payer: MEDICARE | Attending: Nurse Practitioner | Primary: Nurse Practitioner

## 2019-10-09 ENCOUNTER — Encounter: Admit: 2019-10-09 | Discharge: 2019-10-09 | Payer: MEDICARE

## 2019-11-17 ENCOUNTER — Other Ambulatory Visit (INDEPENDENT_AMBULATORY_CARE_PROVIDER_SITE_OTHER): Payer: Self-pay | Admitting: Pediatrics

## 2019-12-11 ENCOUNTER — Telehealth (INDEPENDENT_AMBULATORY_CARE_PROVIDER_SITE_OTHER): Payer: Self-pay | Admitting: Pediatrics

## 2019-12-11 NOTE — Telephone Encounter (Signed)
  Who's calling (name and relationship to patient) : Aleene Davidson NP PCP  Best contact number: [phone]  Provider they see: Dr. Sharene Skeans  Reason for call: Aleene Davidson is Dayvion's new primary care. She noticed that Tullio for the past 3 months he has had oral secretion and Travey wanted to make sure that it wouldn't have anything to do with seizure medications or related neuro issue.     PRESCRIPTION REFILL ONLY  Name of prescription:  Pharmacy:

## 2019-12-11 NOTE — Telephone Encounter (Signed)
I talked with the provider for about 3 minutes.  He has been on Felbatol for about 25 years without a problem I do not believe it is a medicine.  The drooling is intermittent but apparently at this time is worse.  I do not know he is holding onto his saliva is a controlled thing.  He does not seem to be having any trouble with swallowing food and indeed sometimes is various foods that will induce this salivation.  I have no problem with giving him glycopyrrolate or Cupvosa to decrease his salivary production as a symptomatic treatment.  I do not think we are missing some underlying neurologic disorder.

## 2020-02-20 ENCOUNTER — Other Ambulatory Visit (INDEPENDENT_AMBULATORY_CARE_PROVIDER_SITE_OTHER): Payer: Self-pay | Admitting: Pediatrics

## 2020-02-20 NOTE — Telephone Encounter (Signed)
Please send to the pharmacy °

## 2020-04-14 ENCOUNTER — Other Ambulatory Visit: Payer: Self-pay

## 2020-04-14 ENCOUNTER — Encounter (INDEPENDENT_AMBULATORY_CARE_PROVIDER_SITE_OTHER): Payer: Self-pay

## 2020-04-14 ENCOUNTER — Inpatient Hospital Stay
Admission: EM | Admit: 2020-04-14 | Discharge: 2020-04-19 | DRG: 389 | Disposition: A | Discharge status: Home / Self Care | Payer: Medicaid Other | Attending: Internal Medicine | Admitting: Internal Medicine

## 2020-04-14 ENCOUNTER — Encounter: Payer: Self-pay | Admitting: Emergency Medicine

## 2020-04-14 ENCOUNTER — Emergency Department: Payer: Medicaid Other

## 2020-04-14 DIAGNOSIS — F840 Autistic disorder: Secondary | ICD-10-CM | POA: Diagnosis present

## 2020-04-14 DIAGNOSIS — K209 Esophagitis, unspecified without bleeding: Secondary | ICD-10-CM

## 2020-04-14 DIAGNOSIS — Z823 Family history of stroke: Secondary | ICD-10-CM

## 2020-04-14 DIAGNOSIS — Z79899 Other long term (current) drug therapy: Secondary | ICD-10-CM

## 2020-04-14 DIAGNOSIS — E876 Hypokalemia: Secondary | ICD-10-CM | POA: Diagnosis present

## 2020-04-14 DIAGNOSIS — R140 Abdominal distension (gaseous): Secondary | ICD-10-CM

## 2020-04-14 DIAGNOSIS — R638 Other symptoms and signs concerning food and fluid intake: Secondary | ICD-10-CM

## 2020-04-14 DIAGNOSIS — K449 Diaphragmatic hernia without obstruction or gangrene: Secondary | ICD-10-CM | POA: Diagnosis present

## 2020-04-14 DIAGNOSIS — G40309 Generalized idiopathic epilepsy and epileptic syndromes, not intractable, without status epilepticus: Secondary | ICD-10-CM | POA: Diagnosis present

## 2020-04-14 DIAGNOSIS — K567 Ileus, unspecified: Principal | ICD-10-CM | POA: Diagnosis present

## 2020-04-14 DIAGNOSIS — K2100 Gastro-esophageal reflux disease with esophagitis, without bleeding: Secondary | ICD-10-CM | POA: Diagnosis present

## 2020-04-14 DIAGNOSIS — K317 Polyp of stomach and duodenum: Secondary | ICD-10-CM | POA: Diagnosis present

## 2020-04-14 DIAGNOSIS — K5900 Constipation, unspecified: Secondary | ICD-10-CM | POA: Diagnosis not present

## 2020-04-14 DIAGNOSIS — F79 Unspecified intellectual disabilities: Secondary | ICD-10-CM | POA: Diagnosis present

## 2020-04-14 DIAGNOSIS — Z820 Family history of epilepsy and other diseases of the nervous system: Secondary | ICD-10-CM

## 2020-04-14 DIAGNOSIS — G40409 Other generalized epilepsy and epileptic syndromes, not intractable, without status epilepticus: Secondary | ICD-10-CM | POA: Diagnosis present

## 2020-04-14 DIAGNOSIS — R1310 Dysphagia, unspecified: Secondary | ICD-10-CM

## 2020-04-14 DIAGNOSIS — K117 Disturbances of salivary secretion: Secondary | ICD-10-CM | POA: Diagnosis present

## 2020-04-14 DIAGNOSIS — K5909 Other constipation: Secondary | ICD-10-CM | POA: Diagnosis present

## 2020-04-14 DIAGNOSIS — F809 Developmental disorder of speech and language, unspecified: Secondary | ICD-10-CM | POA: Diagnosis present

## 2020-04-14 DIAGNOSIS — R7989 Other specified abnormal findings of blood chemistry: Secondary | ICD-10-CM | POA: Diagnosis present

## 2020-04-14 DIAGNOSIS — Z20822 Contact with and (suspected) exposure to covid-19: Secondary | ICD-10-CM | POA: Diagnosis present

## 2020-04-14 HISTORY — DX: Gastro-esophageal reflux disease without esophagitis: K21.9

## 2020-04-14 LAB — COMPREHENSIVE METABOLIC PANEL
ALT: 46 U/L — ABNORMAL HIGH (ref 0–44)
AST: 41 U/L (ref 15–41)
Albumin: 4.3 g/dL (ref 3.5–5.0)
Alkaline Phosphatase: 91 U/L (ref 38–126)
Anion gap: 15 (ref 5–15)
BUN: 32 mg/dL — ABNORMAL HIGH (ref 6–20)
CO2: 25 mmol/L (ref 22–32)
Calcium: 9 mg/dL (ref 8.9–10.3)
Chloride: 100 mmol/L (ref 98–111)
Creatinine, Ser: 1.02 mg/dL (ref 0.61–1.24)
GFR calc Af Amer: >60 mL/min (ref >60)
GFR calc non Af Amer: >60 mL/min (ref >60)
Glucose, Bld: 141 mg/dL — ABNORMAL HIGH (ref 70–99)
Potassium: 3.8 mmol/L (ref 3.5–5.1)
Sodium: 140 mmol/L (ref 135–145)
Total Bilirubin: 1.1 mg/dL (ref 0.3–1.2)
Total Protein: 9 g/dL — ABNORMAL HIGH (ref 6.5–8.1)

## 2020-04-14 LAB — CBC
HCT: 42.5 % (ref 39.0–52.0)
Hemoglobin: 13.7 g/dL (ref 13.0–17.0)
MCH: 29.3 pg (ref 26.0–34.0)
MCHC: 32.2 g/dL (ref 30.0–36.0)
MCV: 90.8 fL (ref 80.0–100.0)
Platelets: 427 10*3/uL — ABNORMAL HIGH (ref 150–400)
RBC: 4.68 MIL/uL (ref 4.22–5.81)
RDW: 14.3 % (ref 11.5–15.5)
WBC: 5 10*3/uL (ref 4.0–10.5)
nRBC: 0 % (ref 0.0–0.2)

## 2020-04-14 LAB — LIPASE, BLOOD: Lipase: 22 U/L (ref 11–51)

## 2020-04-14 LAB — SARS CORONAVIRUS 2 BY RT PCR (HOSPITAL ORDER, PERFORMED IN ~~LOC~~ HOSPITAL LAB): SARS Coronavirus 2: NEGATIVE

## 2020-04-14 MED ORDER — DEXTROSE-NACL 5-0.9 % IV SOLN: INTRAVENOUS | Status: DC

## 2020-04-14 MED ORDER — ENOXAPARIN SODIUM 40 MG/0.4ML ~~LOC~~ SOLN
40.0000 mg | SUBCUTANEOUS | Status: DC
  Administered 2020-04-17: 40 mg via SUBCUTANEOUS
  Filled 2020-04-14 (×4): qty 0.4

## 2020-04-14 MED ORDER — ONDANSETRON HCL 4 MG PO TABS: 4.0000 mg | ORAL_TABLET | Freq: Four times a day (QID) | ORAL | Status: DC | PRN

## 2020-04-14 MED ORDER — MAGNESIUM CITRATE PO SOLN
1.0000 | Freq: Once | ORAL | Status: AC
  Administered 2020-04-14: 1 via ORAL
  Filled 2020-04-14: qty 296

## 2020-04-14 MED ORDER — ACETAMINOPHEN 650 MG RE SUPP: 650.0000 mg | Freq: Four times a day (QID) | RECTAL | Status: DC | PRN

## 2020-04-14 MED ORDER — ONDANSETRON HCL 4 MG/2ML IJ SOLN: 4.0000 mg | Freq: Four times a day (QID) | INTRAMUSCULAR | Status: DC | PRN

## 2020-04-14 MED ORDER — SORBITOL 70 % SOLN
960.0000 mL | TOPICAL_OIL | Freq: Once | ORAL | Status: AC
  Administered 2020-04-14: 960 mL via RECTAL
  Filled 2020-04-14: qty 473

## 2020-04-14 MED ORDER — HALOPERIDOL LACTATE 5 MG/ML IJ SOLN: 2.5000 mg | Freq: Four times a day (QID) | INTRAMUSCULAR | Status: DC | PRN

## 2020-04-14 MED ORDER — FELBAMATE 600 MG/5ML PO SUSP
600.0000 mg | Freq: Two times a day (BID) | ORAL | Status: DC
  Administered 2020-04-15 (×2): 600 mg via ORAL
  Filled 2020-04-14 (×4): qty 5

## 2020-04-14 MED ORDER — ACETAMINOPHEN 325 MG PO TABS
650.0000 mg | ORAL_TABLET | Freq: Four times a day (QID) | ORAL | Status: DC | PRN
  Filled 2020-04-14: qty 2

## 2020-04-14 NOTE — H&P (Signed)
History and Physical    Jonathan Kirby JTT:[id number] DOB: 1986/05/21 DOA: 04/14/2020  PCP: Almetta Lovely, [facility]   Patient coming from: Home  I have personally briefly reviewed patient's old medical records in [facility]  Chief Complaint: Constipation, decreased oral intake  HPI: Jonathan Kirby is a 34 y.o. male with medical history significant for severe autism, nonverbal at baseline, who was brought in by his parents with 2-week history of constipation not responding to usual laxatives, associated with decreased oral intake and gurgling sounds when eating,  No apparent nausea, and no vomiting but appears to have abdominal discomfort at times.  No fevers.  History limited due to patient's autism and is taken from patient's parents at the bedside. ED Course: On arrival, vitals unremarkable.  Blood work unremarkable.  Chest x-ray benign.  Abdominal x-ray with no evidence of bowel dilatation or obstruction and no free air.  Large amount of stool throughout the colon.  Patient received a large volume enema in the emergency room and drank a bottle of magnesium citrate without a bowel movement.  Hospitalization requested to arrange for possible inpatient GI consultation, barium swallow.  Review of Systems: Unable to obtain due to autism   Past Medical History:  Diagnosis Date  . Autism   . GERD (gastroesophageal reflux disease)   . Seizures (HCC)     Past Surgical History:  Procedure Laterality Date  . CIRCUMCISION  1987     reports that he has never smoked. He has never used smokeless tobacco. He reports that he does not drink alcohol and does not use drugs.  No Known Allergies  Family History  Problem Relation Age of Onset  . Cancer Maternal Grandmother        Died at 27  . Alzheimer's disease Maternal Grandfather        Died at 36  . Stroke Paternal Grandfather        Died at 82      Prior to Admission medications   Medication Sig Start Date End Date Taking?  Authorizing Provider  felbamate (FELBATOL) 600 MG/5ML suspension TAKE 7.5 MLS BY MOUTH TWICE DAILY 02/20/20  Yes Hickling, Deanna Artis, MD  haloperidol (HALDOL) 5 MG tablet TAKE 1/2 TABLET BY MOUTH AS NEEDED AGITATION Patient taking differently: Take 2.5 mg by mouth as needed for agitation.  07/27/19  Yes Deetta Perla, MD    Physical Exam: Vitals:   04/14/20 1440 04/14/20 1449  BP:  117/65  Pulse:  98  Resp:  18  Temp:  97.8 F (36.6 C)  TempSrc:  Axillary  SpO2:  98%  Weight: 54.4 kg      Vitals:   04/14/20 1440 04/14/20 1449  BP:  117/65  Pulse:  98  Resp:  18  Temp:  97.8 F (36.6 C)  TempSrc:  Axillary  SpO2:  98%  Weight: 54.4 kg       Constitutional:  Patient asleep. Not in any apparent distress HEENT:      Head: Normocephalic and atraumatic.         Eyes: PERLA, EOMI, Conjunctivae are normal. Sclera is non-icteric.       Mouth/Throat: Mucous membranes are moist.       Neck: Supple with no signs of meningismus. Cardiovascular: Regular rate and rhythm. No murmurs, gallops, or rubs. 2+ symmetrical distal pulses are present . No JVD. No LE edema Respiratory: Respiratory effort normal .Lungs sounds clear bilaterally. No wheezes, crackles, or rhonchi.  Gastrointestinal: Soft,  non tender, and non distended with positive bowel sounds. No rebound or guarding. Genitourinary: No CVA tenderness. Musculoskeletal: Nontender with normal range of motion in all extremities. No cyanosis, or erythema of extremities. Neurologic: Face is symmetric.No gross focal neurologic deficits . Skin: Skin is warm, dry.  No rash or ulcers  Labs on Admission: I have personally reviewed following labs and imaging studies  CBC: Recent Labs  Lab 04/14/20 1453  WBC 5.0  HGB 13.7  HCT 42.5  MCV 90.8  PLT 427*   Basic Metabolic Panel: Recent Labs  Lab 04/14/20 1453  NA 140  K 3.8  CL 100  CO2 25  GLUCOSE 141*  BUN 32*  CREATININE 1.02  CALCIUM 9.0   GFR: CrCl cannot be  calculated (Unknown ideal weight.). Liver Function Tests: Recent Labs  Lab 04/14/20 1453  AST 41  ALT 46*  ALKPHOS 91  BILITOT 1.1  PROT 9.0*  ALBUMIN 4.3   Recent Labs  Lab 04/14/20 1453  LIPASE 22   No results for input(s): AMMONIA in the last 168 hours. Coagulation Profile: No results for input(s): INR, PROTIME in the last 168 hours. Cardiac Enzymes: No results for input(s): CKTOTAL, CKMB, CKMBINDEX, TROPONINI in the last 168 hours. BNP (last 3 results) No results for input(s): PROBNP in the last 8760 hours. HbA1C: No results for input(s): HGBA1C in the last 72 hours. CBG: No results for input(s): GLUCAP in the last 168 hours. Lipid Profile: No results for input(s): CHOL, HDL, LDLCALC, TRIG, CHOLHDL, LDLDIRECT in the last 72 hours. Thyroid Function Tests: No results for input(s): TSH, T4TOTAL, FREET4, T3FREE, THYROIDAB in the last 72 hours. Anemia Panel: No results for input(s): VITAMINB12, FOLATE, FERRITIN, TIBC, IRON, RETICCTPCT in the last 72 hours. Urine analysis: No results found for: COLORURINE, APPEARANCEUR, LABSPEC, PHURINE, GLUCOSEU, HGBUR, BILIRUBINUR, KETONESUR, PROTEINUR, UROBILINOGEN, NITRITE, LEUKOCYTESUR  Radiological Exams on Admission: DG Chest Portable 1 View  Result Date: 04/14/2020 CLINICAL DATA:  34 year old male with constipation. EXAM: PORTABLE CHEST 1 VIEW COMPARISON:  Chest radiograph dated 02/10/2007. FINDINGS: There is no focal consolidation, pleural effusion, or pneumothorax. The cardiac silhouette is within limits. No acute osseous pathology. IMPRESSION: No active disease. Electronically Signed   By: Elgie Collard M.D.   On: 04/14/2020 16:00   DG Abd Portable 1V  Result Date: 04/14/2020 CLINICAL DATA:  34 year old male with constipation. EXAM: PORTABLE ABDOMEN - 1 VIEW COMPARISON:  None. FINDINGS: There is large amount of stool throughout the colon. No bowel dilatation or evidence of obstruction. No free air or radiopaque calculi. The  osseous structures and soft tissues are grossly unremarkable. IMPRESSION: Constipation. No bowel obstruction. Electronically Signed   By: Elgie Collard M.D.   On: 04/14/2020 15:59    EKG: Not done in the emergency room Assessment/Plan 34 year old male with history of autism and seizure disorder presenting with 2-week history of constipation and decreased oral intake not responding to outpatient laxatives.    Constipation   Decreased oral intake Possible dysphagia -No evidence of obstruction on abdominal x-ray.  Heavy stool burden -Consider GI consult and speech therapy consult in the a.m. if no BM overnight -Patient received SMOG enema in the ER as well as magnesium citrate oral -IV hydration    Generalized convulsive epilepsy (HCC) -Continue felbamate    Autism spectrum disorder with accompanying language impairment and intellectual disability, requiring very substantial support -Increased nursing assistance, sitter required -Liberal parental sitting/visitation    DVT prophylaxis: Lovenox  Code Status: full code  Family Communication: Parents  at bedside Disposition Plan: Back to previous home environment Consults called: none  Status:obs    Andris Baumann MD [facility]     04/14/2020, 7:25 PM

## 2020-04-14 NOTE — ED Notes (Signed)
Will reassess vitals shortly. Pt sleeping, difficult to obtain VS r/t autism and cooperation.

## 2020-04-14 NOTE — ED Provider Notes (Signed)
[facility] Emergency Department Provider Note ____________________________________________   First MD Initiated Contact with Patient 04/14/20 1506     (approximate)  I have reviewed the triage vital signs and the nursing notes.   HISTORY  Chief Complaint decreased appetite and Constipation  Level 5 caveat: History present illness limited due to autism  HPI Jonathan Kirby is a 34 y.o. male with PMH as noted below including a history of autism who presents with constipation over the last 2-1/2 weeks.  The parents have tried Fleet enemas and Dulcolax with no improvement.  The patient does not appear to have any abdominal pain, and has no vomiting.  However he has had decreased appetite and has been eating or drinking very little over this period.  He has had problems with constipation in the past, but not for this long.  The parents state that they are concerned he may have some difficulty swallowing as when he eats or drinks he appears to struggle and have to push the food down.  He also has issues with hypersalivation, although this has been more longstanding.  Past Medical History:  Diagnosis Date  . Autism   . GERD (gastroesophageal reflux disease)   . Seizures [facility])     Patient Active Problem List   Diagnosis Date Noted  . Constipation 04/14/2020  . Decreased oral intake 04/14/2020  . Dysphagia 04/14/2020  . 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 Autism spectrum disorder with accompanying language impairment and intellectual disability, requiring very substantial support 07/23/2014  . Generalized convulsive epilepsy (HCC) 04/02/2013  . Partial epilepsy with impairment of consciousness (HCC) 04/02/2013  . Moderate intellectual disabilities 04/02/2013  . Insomnia, unspecified 04/02/2013  . Intermittent explosive disorder 04/02/2013    Past Surgical History:  Procedure Laterality Date  . CIRCUMCISION  1987    Prior to  Admission medications   Medication Sig Start Date End Date Taking? Authorizing Provider  felbamate (FELBATOL) 600 MG/5ML suspension TAKE 7.5 MLS BY MOUTH TWICE DAILY 02/20/20  Yes Hickling, Deanna Artis, MD  haloperidol (HALDOL) 5 MG tablet TAKE 1/2 TABLET BY MOUTH AS NEEDED AGITATION Patient taking differently: Take 2.5 mg by mouth as needed for agitation.  07/27/19  Yes Deetta Perla, MD    Allergies Patient has no known allergies.  Family History  Problem Relation Age of Onset  . Cancer Maternal Grandmother        Died at 64  . Alzheimer's disease Maternal Grandfather        Died at 28  . Stroke Paternal Grandfather        Died at 21    Social History Social History   Tobacco Use  . Smoking status: Never Smoker  . Smokeless tobacco: Never Used  Substance Use Topics  . Alcohol use: No    Alcohol/week: 0.0 standard drinks  . Drug use: No    Review of Systems Level 5 caveat: Unable to obtain review of systems due to autism   ____________________________________________   PHYSICAL EXAM:  VITAL SIGNS: ED Triage Vitals  Enc Vitals Group     BP 04/14/20 1449 117/65     Pulse Rate 04/14/20 1449 98     Resp 04/14/20 1449 18     Temp 04/14/20 1449 97.8 F (36.6 C)     Temp Source 04/14/20 1449 Axillary     SpO2 04/14/20 1449 98 %     Weight 04/14/20 1440 120 lb (54.4 kg)  Height --      Head Circumference --      Peak Flow --      Pain Score --      Pain Loc --      Pain Edu? --      Excl. in GC? --     Constitutional: Alert, nonverbal.  Relatively comfortable appearing. Eyes: Conjunctivae are normal.  No scleral icterus. Head: Atraumatic. Nose: No congestion/rhinnorhea. Mouth/Throat: Mucous membranes are moist.   Neck: Normal range of motion.  Cardiovascular: Normal rate, regular rhythm.  Good peripheral circulation. Respiratory: Normal respiratory effort.  No retractions. Gastrointestinal: Soft and nontender. No distention.  Genitourinary: No flank  tenderness. Musculoskeletal: No lower extremity edema.  Extremities warm and well perfused.  Neurologic: Motor intact in all extremities. Skin:  Skin is warm and dry. No rash noted. Psychiatric: Mood and affect are normal. Speech and behavior are normal.  ____________________________________________   LABS (all labs ordered are listed, but only abnormal results are displayed)  Labs Reviewed  COMPREHENSIVE METABOLIC PANEL - Abnormal; Notable for the following components:      Result Value   Glucose, Bld 141 (*)    BUN 32 (*)    Total Protein 9.0 (*)    ALT 46 (*)    All other components within normal limits  CBC - Abnormal; Notable for the following components:   Platelets 427 (*)    All other components within normal limits  SARS CORONAVIRUS 2 BY RT PCR (HOSPITAL ORDER, PERFORMED IN [facility])  LIPASE, BLOOD  URINALYSIS, COMPLETE (UACMP) WITH MICROSCOPIC   ____________________________________________  EKG   ____________________________________________  RADIOLOGY  CXR: No acute abnormality XR abdomen: Stool throughout the colon with a nonobstructive pattern  ____________________________________________   PROCEDURES  Procedure(s) performed: No  Procedures  Critical Care performed: No ____________________________________________   INITIAL IMPRESSION / ASSESSMENT AND PLAN / ED COURSE  Pertinent labs & imaging results that were available during my care of the patient were reviewed by me and considered in my medical decision making (see chart for details).  34 year old male with a history of autism presents with constipation, with no bowel movement in the last 2.5 weeks.  He has had decreased p.o. intake, and the parents are also concerned that he may be having difficulty swallowing.  He has had problems with constipation and hypersalivation in the past, but the constipation has not been this longstanding previously.  He did not respond to Dulcolax and  a Fleet enema at home.  On exam, the patient is alert but nonverbal.  He has received a total of 5 mg of Haldol today to facilitate evaluation.  The patient's abdomen is soft and nontender, with no distention.  His mucous membranes are moist.  1.  Constipation: Overall I have a low suspicion for mechanical obstruction.  The patient has no history of eating nonfood items, and his abdominal exam is reassuring.  Will obtain an x-ray to evaluate the stool pattern and rule out distended bowel loops or other concerning findings.  If x-ray is consistent with simple constipation we may try other treatments for constipation.  2.  Decreased p.o. intake/possible dysphagia: We will obtain basic labs to evaluate the patient's electrolytes and kidney function.  His mucous membranes are moist and he overall appears hydrated.  We will give fluids if needed based on the lab work-up.  Based on the patient's mental status, it would be extremely difficult to get a swallow study on him.  If  the lab work-up is reassuring and the patient is stable I will discuss with GI about possible close outpatient follow-up.  ----------------------------------------- 7:20 PM on 04/14/2020 -----------------------------------------  X-ray reveals stool in the colon with no evidence of obstruction.  The patient had was given a SMOG enema, but has not had a bowel movement.  I had extensive discussion with the parents.  Given the patient's apparent dysphagia I am concerned that he will not be able to tolerate magnesium citrate, lactulose, or other more potent p.o. medications.  He likely will also need a barium swallow or other swallow study in order to further evaluate the etiology of his dysphagia.  Given that he has not been tolerating p.o. I feel that it would make the most sense to admit the patient for GI evaluation, swallow study, and potential further treatments for constipation.  The family agrees.  I discussed the case with Dr.  Para March from the hospitalist service for admission. ____________________________________________   FINAL CLINICAL IMPRESSION(S) / ED DIAGNOSES  Final diagnoses:  Dysphagia, unspecified type  Constipation, unspecified constipation type      NEW MEDICATIONS STARTED DURING THIS VISIT:  New Prescriptions   No medications on file     Note:  This document was prepared using Dragon voice recognition software and may include unintentional dictation errors.   Dionne Bucy, MD 04/14/20 [phone]

## 2020-04-14 NOTE — ED Triage Notes (Addendum)
Here for constipation, no bowel movement X 2 weeks. Mom also reports pt has not been eating or drinking well and seems to describe dysphagia.  Pt severely autistic, mom have 5 mg haldol prior to coming so able to get pt to ED

## 2020-04-14 NOTE — Telephone Encounter (Signed)
Mom Airline pilot) has called requesting a call back in reference to MyChart message - states that they need to take Jonathan Kirby to the Er and he will need to be sedated - she would like to know dosage recommended.

## 2020-04-14 NOTE — ED Notes (Signed)
Pt had large BM of loose stool. Pt cleaned up, linens changed, clean brief and gown placed on pt.

## 2020-04-14 NOTE — ED Notes (Signed)
No bowel movement yet. Mother and father at bedside. Will notify RN when goes. Will delay vitals at this time, mother would not like to wake pt at this time.

## 2020-04-14 NOTE — ED Notes (Signed)
Waiting on smog enema from pharmacy

## 2020-04-15 ENCOUNTER — Encounter: Payer: Self-pay | Admitting: Internal Medicine

## 2020-04-15 DIAGNOSIS — Z79899 Other long term (current) drug therapy: Secondary | ICD-10-CM | POA: Diagnosis not present

## 2020-04-15 DIAGNOSIS — Z20822 Contact with and (suspected) exposure to covid-19: Secondary | ICD-10-CM | POA: Diagnosis present

## 2020-04-15 DIAGNOSIS — K5900 Constipation, unspecified: Secondary | ICD-10-CM

## 2020-04-15 DIAGNOSIS — K317 Polyp of stomach and duodenum: Secondary | ICD-10-CM | POA: Diagnosis present

## 2020-04-15 DIAGNOSIS — G40909 Epilepsy, unspecified, not intractable, without status epilepticus: Secondary | ICD-10-CM | POA: Diagnosis not present

## 2020-04-15 DIAGNOSIS — E876 Hypokalemia: Secondary | ICD-10-CM | POA: Diagnosis present

## 2020-04-15 DIAGNOSIS — K209 Esophagitis, unspecified without bleeding: Secondary | ICD-10-CM | POA: Diagnosis not present

## 2020-04-15 DIAGNOSIS — K2100 Gastro-esophageal reflux disease with esophagitis, without bleeding: Secondary | ICD-10-CM | POA: Diagnosis present

## 2020-04-15 DIAGNOSIS — K449 Diaphragmatic hernia without obstruction or gangrene: Secondary | ICD-10-CM | POA: Diagnosis present

## 2020-04-15 DIAGNOSIS — Z823 Family history of stroke: Secondary | ICD-10-CM | POA: Diagnosis not present

## 2020-04-15 DIAGNOSIS — F840 Autistic disorder: Secondary | ICD-10-CM

## 2020-04-15 DIAGNOSIS — F809 Developmental disorder of speech and language, unspecified: Secondary | ICD-10-CM | POA: Diagnosis present

## 2020-04-15 DIAGNOSIS — R1310 Dysphagia, unspecified: Secondary | ICD-10-CM

## 2020-04-15 DIAGNOSIS — F79 Unspecified intellectual disabilities: Secondary | ICD-10-CM | POA: Diagnosis present

## 2020-04-15 DIAGNOSIS — G40409 Other generalized epilepsy and epileptic syndromes, not intractable, without status epilepticus: Secondary | ICD-10-CM | POA: Diagnosis present

## 2020-04-15 DIAGNOSIS — K5909 Other constipation: Secondary | ICD-10-CM | POA: Diagnosis present

## 2020-04-15 DIAGNOSIS — Z820 Family history of epilepsy and other diseases of the nervous system: Secondary | ICD-10-CM | POA: Diagnosis not present

## 2020-04-15 DIAGNOSIS — R7989 Other specified abnormal findings of blood chemistry: Secondary | ICD-10-CM | POA: Diagnosis present

## 2020-04-15 DIAGNOSIS — K117 Disturbances of salivary secretion: Secondary | ICD-10-CM | POA: Diagnosis present

## 2020-04-15 DIAGNOSIS — K567 Ileus, unspecified: Secondary | ICD-10-CM | POA: Diagnosis present

## 2020-04-15 LAB — URINALYSIS, COMPLETE (UACMP) WITH MICROSCOPIC
Bacteria, UA: NONE SEEN
Glucose, UA: NEGATIVE mg/dL
Hgb urine dipstick: NEGATIVE
Ketones, ur: 20 mg/dL — AB
Leukocytes,Ua: NEGATIVE
Nitrite: NEGATIVE
Protein, ur: 100 mg/dL — AB
Specific Gravity, Urine: 1.043 — ABNORMAL HIGH (ref 1.005–1.030)
Squamous Epithelial / HPF: NONE SEEN (ref 0–5)
pH: 5 (ref 5.0–8.0)

## 2020-04-15 MED ORDER — CHLORHEXIDINE GLUCONATE 0.12 % MT SOLN
15.0000 mL | Freq: Two times a day (BID) | OROMUCOSAL | Status: DC
  Administered 2020-04-16 – 2020-04-18 (×3): 15 mL via OROMUCOSAL
  Filled 2020-04-15 (×6): qty 15

## 2020-04-15 MED ORDER — ORAL CARE MOUTH RINSE: 15.0000 mL | Freq: Two times a day (BID) | OROMUCOSAL | Status: DC

## 2020-04-15 MED ORDER — LACTULOSE 10 GM/15ML PO SOLN
30.0000 g | Freq: Two times a day (BID) | ORAL | Status: DC
  Administered 2020-04-15: 30 g via ORAL
  Filled 2020-04-15: qty 60

## 2020-04-15 MED ORDER — BISACODYL 10 MG RE SUPP
10.0000 mg | Freq: Two times a day (BID) | RECTAL | Status: DC
  Administered 2020-04-15 – 2020-04-17 (×5): 10 mg via RECTAL
  Filled 2020-04-15 (×9): qty 1

## 2020-04-15 MED ORDER — FELBAMATE 600 MG/5ML PO SUSP
900.0000 mg | Freq: Two times a day (BID) | ORAL | Status: DC
  Administered 2020-04-15 – 2020-04-19 (×8): 900 mg via ORAL
  Filled 2020-04-15 (×11): qty 7.5

## 2020-04-15 MED ORDER — FLEET ENEMA 7-19 GM/118ML RE ENEM: 1.0000 | ENEMA | Freq: Once | RECTAL | Status: DC

## 2020-04-15 MED ORDER — LACTULOSE 10 GM/15ML PO SOLN
30.0000 g | Freq: Four times a day (QID) | ORAL | Status: DC
  Administered 2020-04-15: 30 g via ORAL
  Filled 2020-04-15: qty 60

## 2020-04-15 MED ORDER — PEG 3350-KCL-NA BICARB-NACL 420 G PO SOLR
4000.0000 mL | Freq: Once | ORAL | Status: AC
  Administered 2020-04-15: 4000 mL via ORAL
  Filled 2020-04-15: qty 4000

## 2020-04-15 NOTE — Consult Note (Signed)
[name], MD [address], [address], [place], [place], [address] [address], [address], [place], [place], [address] Phone: [phone]  Fax: [phone]  Consultation  Referring Provider:     Dr. Hilton Sinclair Primary Care Physician:  Almetta Lovely[facility] Reason for Consultation:     Dysphagia, constipation  Date of Admission:  04/14/2020 Date of Consultation:  04/15/2020         HPI:   Jonathan Kirby is a 34 y.o. male with autism, nonverbal, history obtained from chart, parents at bedside, with GI consulted for constipation and dysphagia.  Parents state, since March of this year patient has been having constipation and was thought to have acid reflux by doctors making house calls as mother reported increased elevation.  Protonix was started which helped, but parents feel like he may have had some trouble swallowing, but given that he is unable to provide history, symptoms are unclear.  His last full bowel movement was about 2 weeks ago.  Did receive Fleet enema and suppository at home with some stool smear output, but not a full bowel movement.  Has tried milk of magnesia as an outpatient as well.  Was using MiraLAX as needed but was never been on it daily.  But mother states, he does not like drinking liquids and has to have carbonated beverages.  She has trouble getting him to drink medications.  No blood in stool.  No family history of colon cancer.  No prior EGD or colonoscopy.  Past Medical History:  Diagnosis Date  . Autism   . GERD (gastroesophageal reflux disease)   . Seizures (HCC)     Past Surgical History:  Procedure Laterality Date  . CIRCUMCISION  1987    Prior to Admission medications   Medication Sig Start Date End Date Taking? Authorizing Provider  felbamate (FELBATOL) 600 MG/5ML suspension TAKE 7.5 MLS BY MOUTH TWICE DAILY 02/20/20  Yes Hickling, Deanna Artis, MD  haloperidol (HALDOL) 5 MG tablet TAKE 1/2 TABLET BY MOUTH AS NEEDED AGITATION Patient  taking differently: Take 2.5 mg by mouth as needed for agitation.  07/27/19  Yes Deetta Perla, MD    Family History  Problem Relation Age of Onset  . Cancer Maternal Grandmother        Died at 70  . Alzheimer's disease Maternal Grandfather        Died at 18  . Stroke Paternal Grandfather        Died at 63     Social History   Tobacco Use  . Smoking status: Never Smoker  . Smokeless tobacco: Never Used  Substance Use Topics  . Alcohol use: No    Alcohol/week: 0.0 standard drinks  . Drug use: No    Allergies as of 04/14/2020  . (No Known Allergies)    Review of Systems:    All systems reviewed and negative except where noted in HPI.   Physical Exam:  Vital signs in last 24 hours: Vitals:   04/14/20 1440 04/14/20 1449 04/15/20 0834  BP:  117/65 115/71  Pulse:  98 98  Resp:  18 16  Temp:  97.8 F (36.6 C) (!) 96.7 F (35.9 C)  TempSrc:  Axillary Axillary  SpO2:  98% 100%  Weight: 54.4 kg       General:   Pleasant, cooperative in NAD Head:  Normocephalic and atraumatic. Eyes:   No icterus.   Conjunctiva pink. PERRLA. Ears:  Normal auditory acuity. Neck:  Supple; no masses or  thyroidomegaly Lungs: Respirations even and unlabored. Lungs clear to auscultation bilaterally.   No wheezes, crackles, or rhonchi.  Abdomen:  Soft, nondistended, nontender. Normal bowel sounds. No appreciable masses or hepatomegaly.  No rebound or guarding.  Neurologic:  Alert and oriented x3;  grossly normal neurologically. Skin:  Intact without significant lesions or rashes. Cervical Nodes:  No significant cervical adenopathy. Psych:  Alert and cooperative. Normal affect.  LAB RESULTS: Recent Labs    04/14/20 1453  WBC 5.0  HGB 13.7  HCT 42.5  PLT 427*   BMET Recent Labs    04/14/20 1453  NA 140  K 3.8  CL 100  CO2 25  GLUCOSE 141*  BUN 32*  CREATININE 1.02  CALCIUM 9.0   LFT Recent Labs    04/14/20 1453  PROT 9.0*  ALBUMIN 4.3  AST 41  ALT 46*  ALKPHOS  91  BILITOT 1.1   PT/INR No results for input(s): LABPROT, INR in the last 72 hours.  STUDIES: DG Chest Portable 1 View  Result Date: 04/14/2020 CLINICAL DATA:  34 year old male with constipation. EXAM: PORTABLE CHEST 1 VIEW COMPARISON:  Chest radiograph dated 02/10/2007. FINDINGS: There is no focal consolidation, pleural effusion, or pneumothorax. The cardiac silhouette is within limits. No acute osseous pathology. IMPRESSION: No active disease. Electronically Signed   By: Elgie Collard M.D.   On: 04/14/2020 16:00   DG Abd Portable 1V  Result Date: 04/14/2020 CLINICAL DATA:  34 year old male with constipation. EXAM: PORTABLE ABDOMEN - 1 VIEW COMPARISON:  None. FINDINGS: There is large amount of stool throughout the colon. No bowel dilatation or evidence of obstruction. No free air or radiopaque calculi. The osseous structures and soft tissues are grossly unremarkable. IMPRESSION: Constipation. No bowel obstruction. Electronically Signed   By: Elgie Collard M.D.   On: 04/14/2020 15:59      Impression / Plan:   Jonathan Kirby is a 34 y.o. y/o male with autism, unable to provide reliable history, with parents reporting possible dysphagia and constipation since March  Clinical picture is complicated by patient's severe autism, and unreliable history and compliance due to the same  Primary team has ordered GoLYTELY prep for constipation Patient is starting the prep today  Monitor for bowel movements Abdomen is soft, nontender nondistended.  No evidence of obstruction  Patient will likely need to be on bowel movement regimen daily as an outpatient.  We did discuss Linzess, but mother does not know if he will take the pill if it is too large or small.  We also discussed MiraLAX daily but she does not think he will take the liquid as he only likes carbonated beverages.  Would recommend either Linzess, Trulance or Amitiza as an outpatient, which ever pill mother is able to give him on a  daily basis, and is small enough for him to take.  Continue with GoLYTELY prep and monitor for bowel movements  EGD discussed with parents with alternative of barium swallow study also discussed.  However, given his mental status and autism he is unlikely to be able to cooperate with esophagram and EGD would likely be the best option to evaluate his dysphagia  Family agreeable.  EGD in 1 to 2 days  will place n.p.o. orders as appropriate depending on timing of procedure  Thank you for involving me in the care of this patient.      LOS: 0 days   Pasty Spillers, MD  04/15/2020, 3:51 PM

## 2020-04-15 NOTE — ED Notes (Addendum)
This RN messaged admitting MD in regards to pt getting increasingly uncomfortable and abdomen has become more tight and distended. Mother stated she does not feel the enema, dulcolax and lactulose is not helping his constipation and is making his stomach upset. No new orders at this time.

## 2020-04-15 NOTE — Progress Notes (Signed)
Patient ID: ROMAINE NEVILLE, male   DOB: [birth year], 34 y.o.   MRN: [id number] [facility] PROGRESS NOTE  KAEDAN RICHERT HCW:[id number] DOB: 16-Oct-1985 DOA: 04/14/2020 PCP: [facility]  HPI/Subjective: Patient is nonverbal.  Patient's mother and father at the bedside.  Patient has had decreased appetite and not eating as well as he normally does for the last 2 weeks and not much last 2 days.  Had some trouble swallowing the liquids.  He has had history of chronic constipation PMD is trying MiraLAX and MOM.  Also did Fleet enema and Dulcolax.  He was able to drink the mag citrate today.  Able to swallow the lactulose.  Objective: Vitals:   04/14/20 1449 04/15/20 0834  BP: 117/65 115/71  Pulse: 98 98  Resp: 18 16  Temp: 97.8 F (36.6 C) (!) 96.7 F (35.9 C)  SpO2: 98% 100%   No intake or output data in the 24 hours ending 04/15/20 1249 Filed Weights   04/14/20 1440  Weight: 54.4 kg    ROS: Review of Systems  Unable to perform ROS: Patient nonverbal   Exam: Physical Exam HENT:     Nose: No mucosal edema.     Mouth/Throat:     Comments: Unable to look into mouth. Eyes:     General: Lids are normal.     Conjunctiva/sclera: Conjunctivae normal.  Cardiovascular:     Rate and Rhythm: Normal rate and regular rhythm.     Heart sounds: Normal heart sounds, S1 normal and S2 normal.  Pulmonary:     Breath sounds: No decreased breath sounds, wheezing, rhonchi or rales.  Abdominal:     General: There is distension.     Palpations: Abdomen is soft.     Tenderness: There is no abdominal tenderness.  Musculoskeletal:     Right ankle: No swelling.     Left ankle: No swelling.  Skin:    General: Skin is warm.     Findings: No rash.  Neurological:     Mental Status: He is alert.       Data Reviewed: Basic Metabolic Panel: Recent Labs  Lab 04/14/20 1453  NA 140  K 3.8  CL 100  CO2 25  GLUCOSE 141*  BUN 32*  CREATININE 1.02  CALCIUM 9.0   Liver  Function Tests: Recent Labs  Lab 04/14/20 1453  AST 41  ALT 46*  ALKPHOS 91  BILITOT 1.1  PROT 9.0*  ALBUMIN 4.3   Recent Labs  Lab 04/14/20 1453  LIPASE 22   CBC: Recent Labs  Lab 04/14/20 1453  WBC 5.0  HGB 13.7  HCT 42.5  MCV 90.8  PLT 427*    Recent Results (from the past 240 hour(s))  SARS Coronavirus 2 by RT PCR (hospital order, performed in [facility] hospital lab) Nasopharyngeal Nasopharyngeal Swab     Status: None   Collection Time: 04/14/20  7:01 PM   Specimen: Nasopharyngeal Swab  Result Value Ref Range Status   SARS Coronavirus 2 NEGATIVE NEGATIVE Final    Comment: (NOTE) SARS-CoV-2 target nucleic acids are NOT DETECTED.  The SARS-CoV-2 RNA is generally detectable in upper and lower respiratory specimens during the acute phase of infection. The lowest concentration of SARS-CoV-2 viral copies this assay can detect is 250 copies / mL. A negative result does not preclude SARS-CoV-2 infection and should not be used as the sole basis for treatment or other patient management decisions.  A negative result may occur with improper specimen collection /  handling, submission of specimen other than nasopharyngeal swab, presence of viral mutation(s) within the areas targeted by this assay, and inadequate number of viral copies (<250 copies / mL). A negative result must be combined with clinical observations, patient history, and epidemiological information.  Fact Sheet for Patients:   BoilerBrush.com.cy  Fact Sheet for Healthcare Providers: https://pope.com/  This test is not yet approved or  cleared by the [place] FDA and has been authorized for detection and/or diagnosis of SARS-CoV-2 by FDA under an Emergency Use Authorization (EUA).  This EUA will remain in effect (meaning this test can be used) for the duration of the COVID-19 declaration under Section 564(b)(1) of the Act, 21 U.S.C. section  360bbb-3(b)(1), unless the authorization is terminated or revoked sooner.  Performed at [facility], [address], [place], [place] [address]      Studies: DG Chest Portable 1 View  Result Date: 04/14/2020 CLINICAL DATA:  34 year old male with constipation. EXAM: PORTABLE CHEST 1 VIEW COMPARISON:  Chest radiograph dated 02/10/2007. FINDINGS: There is no focal consolidation, pleural effusion, or pneumothorax. The cardiac silhouette is within limits. No acute osseous pathology. IMPRESSION: No active disease. Electronically Signed   By: Elgie Collard M.D.   On: 04/14/2020 16:00   DG Abd Portable 1V  Result Date: 04/14/2020 CLINICAL DATA:  34 year old male with constipation. EXAM: PORTABLE ABDOMEN - 1 VIEW COMPARISON:  None. FINDINGS: There is large amount of stool throughout the colon. No bowel dilatation or evidence of obstruction. No free air or radiopaque calculi. The osseous structures and soft tissues are grossly unremarkable. IMPRESSION: Constipation. No bowel obstruction. Electronically Signed   By: Elgie Collard M.D.   On: 04/14/2020 15:59    Scheduled Meds: . bisacodyl  10 mg Rectal BID  . enoxaparin (LOVENOX) injection  40 mg Subcutaneous Q24H  . felbamate  900 mg Oral Q12H  . lactulose  30 g Oral QID   Continuous Infusions: . dextrose 5 % and 0.9% NaCl 75 mL/hr at 04/15/20 1024    Assessment/Plan:  1. Acute on chronic constipation.  Patient's mother stated only a small bowel movement with the enema.  She is interested in doing suppository and lactulose.  She does not feel the patient tolerated a GoLYTELY prep at this time.  Patient's mother requested a GI consultation. 2. Dysphagia.  We will get speech therapy evaluation. 3. Seizure disorder on felbamate 4. Autism and nonverbal.  The patient is able to ambulate as per the patient's mother.      Code Status:     Code Status Orders  (From admission, onward)         Start     Ordered   04/14/20  1922  Full code  Continuous        04/14/20 1925        Code Status History    This patient has a current code status but no historical code status.   Advance Care Planning Activity     Family Communication: Parents at the bedside Disposition Plan: Status is: Observation  Dispo: The patient is from: Home              Anticipated d/c is to: Home              Anticipated d/c date is: Family would like better response to constipation medications and more bowel movements prior to disposition potentially tomorrow              Patient currently being treated  for severe constipation  Consultants:  Speech therapy  Gastroenterology  Time spent: 27 minutes  [employer]

## 2020-04-15 NOTE — ED Notes (Signed)
Parents provided with applesauce to see if pt will eat anything. Water cup at bedside. Parents state pt has been drinking. No urination at this time.

## 2020-04-16 DIAGNOSIS — R1310 Dysphagia, unspecified: Secondary | ICD-10-CM | POA: Diagnosis not present

## 2020-04-16 DIAGNOSIS — K5900 Constipation, unspecified: Secondary | ICD-10-CM | POA: Diagnosis not present

## 2020-04-16 DIAGNOSIS — F840 Autistic disorder: Secondary | ICD-10-CM | POA: Diagnosis not present

## 2020-04-16 MED ORDER — LINACLOTIDE 290 MCG PO CAPS
290.0000 ug | ORAL_CAPSULE | Freq: Every day | ORAL | Status: DC
  Administered 2020-04-16 – 2020-04-19 (×3): 290 ug via ORAL
  Filled 2020-04-16 (×4): qty 1

## 2020-04-16 NOTE — Progress Notes (Signed)
PROGRESS NOTE    Jonathan Kirby  GMW:[id number] DOB: [birth year] DOA: 04/14/2020 PCP: [facility]   Assessment & Plan:   Principal Problem:   Constipation Active Problems:   Generalized convulsive epilepsy (HCC)   Autism spectrum disorder with accompanying language impairment and intellectual disability, requiring very substantial support   Decreased oral intake   Dysphagia  Acute on chronic constipation: had a small bowel movement after enema. Continue on golytely as tolerated. Started on linzess as per GI   Dysphagia: will do EGD tomorrow as per GI. NPO after midnight as per GI   Seizure disorder: unknown type or severity. Continue on felbamate   Autism: nonverbal. Continue w/ supportive care   Thrombocytosis: etiology unclear, likely reactive. Will continue to monitor    DVT prophylaxis: lovenox Code Status: full  Family Communication: discussed pt's care w/ pt's parents who both are at bedside and answered their questions  Disposition Plan: likely d/c back home w/ family care   Consultants:   GI   Procedures:    Antimicrobials:   Subjective: Pt has autism and is non-verbal at baseline. Pt has only had a small bowel movement, pt is still constipated   Objective: Vitals:   04/15/20 0834 04/15/20 2023 04/16/20 0147 04/16/20 0549  BP: 115/71 106/71 105/84 103/75  Pulse: 98 91 104 103  Resp: 16 19 15 16   Temp: (!) 96.7 F (35.9 C) (!) 97.2 F (36.2 C) 97.7 F (36.5 C) 97.8 F (36.6 C)  TempSrc: Axillary Axillary Oral Oral  SpO2: 100% 100% 97% 98%  Weight:        Intake/Output Summary (Last 24 hours) at 04/16/2020 0826 Last data filed at 04/16/2020 0500 Gross per 24 hour  Intake 692.76 ml  Output --  Net 692.76 ml   Filed Weights   04/14/20 1440  Weight: 54.4 kg    Examination:  General exam: Appears calm but uncomfortable  Respiratory system: decreased breath sounds b/l. No wheezes, rales  Cardiovascular system: S1 & S2+. No  rubs, gallops or clicks. No pedal edema. Gastrointestinal system: Abdomen is nondistended, soft and nontender. Hypoactive bowel sounds heard. Central nervous system: Awake. Moves all 4 extremities  Psychiatry: Judgement and insight appear abnormal. Flat mood and affect.     Data Reviewed: I have personally reviewed following labs and imaging studies  CBC: Recent Labs  Lab 04/14/20 1453  WBC 5.0  HGB 13.7  HCT 42.5  MCV 90.8  PLT 427*   Basic Metabolic Panel: Recent Labs  Lab 04/14/20 1453  NA 140  K 3.8  CL 100  CO2 25  GLUCOSE 141*  BUN 32*  CREATININE 1.02  CALCIUM 9.0   GFR: CrCl cannot be calculated (Unknown ideal weight.). Liver Function Tests: Recent Labs  Lab 04/14/20 1453  AST 41  ALT 46*  ALKPHOS 91  BILITOT 1.1  PROT 9.0*  ALBUMIN 4.3   Recent Labs  Lab 04/14/20 1453  LIPASE 22   No results for input(s): AMMONIA in the last 168 hours. Coagulation Profile: No results for input(s): INR, PROTIME in the last 168 hours. Cardiac Enzymes: No results for input(s): CKTOTAL, CKMB, CKMBINDEX, TROPONINI in the last 168 hours. BNP (last 3 results) No results for input(s): PROBNP in the last 8760 hours. HbA1C: No results for input(s): HGBA1C in the last 72 hours. CBG: No results for input(s): GLUCAP in the last 168 hours. Lipid Profile: No results for input(s): CHOL, HDL, LDLCALC, TRIG, CHOLHDL, LDLDIRECT in the last 72 hours. Thyroid  Function Tests: No results for input(s): TSH, T4TOTAL, FREET4, T3FREE, THYROIDAB in the last 72 hours. Anemia Panel: No results for input(s): VITAMINB12, FOLATE, FERRITIN, TIBC, IRON, RETICCTPCT in the last 72 hours. Sepsis Labs: No results for input(s): PROCALCITON, LATICACIDVEN in the last 168 hours.  Recent Results (from the past 240 hour(s))  SARS Coronavirus 2 by RT PCR (hospital order, performed in [facility] hospital lab) Nasopharyngeal Nasopharyngeal Swab     Status: None   Collection Time: 04/14/20  7:01 PM     Specimen: Nasopharyngeal Swab  Result Value Ref Range Status   SARS Coronavirus 2 NEGATIVE NEGATIVE Final    Comment: (NOTE) SARS-CoV-2 target nucleic acids are NOT DETECTED.  The SARS-CoV-2 RNA is generally detectable in upper and lower respiratory specimens during the acute phase of infection. The lowest concentration of SARS-CoV-2 viral copies this assay can detect is 250 copies / mL. A negative result does not preclude SARS-CoV-2 infection and should not be used as the sole basis for treatment or other patient management decisions.  A negative result may occur with improper specimen collection / handling, submission of specimen other than nasopharyngeal swab, presence of viral mutation(s) within the areas targeted by this assay, and inadequate number of viral copies (<250 copies / mL). A negative result must be combined with clinical observations, patient history, and epidemiological information.  Fact Sheet for Patients:   BoilerBrush.com.cy  Fact Sheet for Healthcare Providers: https://pope.com/  This test is not yet approved or  cleared by the [place] FDA and has been authorized for detection and/or diagnosis of SARS-CoV-2 by FDA under an Emergency Use Authorization (EUA).  This EUA will remain in effect (meaning this test can be used) for the duration of the COVID-19 declaration under Section 564(b)(1) of the Act, 21 U.S.C. section 360bbb-3(b)(1), unless the authorization is terminated or revoked sooner.  Performed at [facility], [address], [place], [place] [address]          Radiology Studies: DG Chest Portable 1 View  Result Date: 04/14/2020 CLINICAL DATA:  33 year old male with constipation. EXAM: PORTABLE CHEST 1 VIEW COMPARISON:  Chest radiograph dated 02/10/2007. FINDINGS: There is no focal consolidation, pleural effusion, or pneumothorax. The cardiac silhouette is within limits. No  acute osseous pathology. IMPRESSION: No active disease. Electronically Signed   By: Elgie Collard M.D.   On: 04/14/2020 16:00   DG Abd Portable 1V  Result Date: 04/14/2020 CLINICAL DATA:  34 year old male with constipation. EXAM: PORTABLE ABDOMEN - 1 VIEW COMPARISON:  None. FINDINGS: There is large amount of stool throughout the colon. No bowel dilatation or evidence of obstruction. No free air or radiopaque calculi. The osseous structures and soft tissues are grossly unremarkable. IMPRESSION: Constipation. No bowel obstruction. Electronically Signed   By: Elgie Collard M.D.   On: 04/14/2020 15:59        Scheduled Meds: . bisacodyl  10 mg Rectal BID  . chlorhexidine  15 mL Mouth Rinse BID  . enoxaparin (LOVENOX) injection  40 mg Subcutaneous Q24H  . felbamate  900 mg Oral Q12H  . mouth rinse  15 mL Mouth Rinse q12n4p  . sodium phosphate  1 enema Rectal Once   Continuous Infusions: . dextrose 5 % and 0.9% NaCl 75 mL/hr at 04/15/20 2000     LOS: 1 day    Time spent: 33 mins     Charise Killian, MD [facility] Pager [phone]  If 7PM-7AM, please contact night-coverage [email or web address] 04/16/2020, 8:26 AM

## 2020-04-16 NOTE — Progress Notes (Addendum)
[name], MD [address], [place], [place], [address] [address], [place], [place], [address] Phone: [phone]  Fax: [phone]   Subjective: Parents at bedside.  Mother states patient has not had any bowel movements.  Has not taken the GoLYTELY prep ordered by primary team for constipation   Objective: Exam: Vital signs in last 24 hours: Vitals:   04/15/20 2023 04/16/20 0147 04/16/20 0549 04/16/20 0935  BP: 106/71 105/84 103/75 103/81  Pulse: 91 104 103 89  Resp: 19 15 16 17   Temp: (!) 97.2 F (36.2 C) 97.7 F (36.5 C) 97.8 F (36.6 C) 97.6 F (36.4 C)  TempSrc: Axillary Oral Oral Oral  SpO2: 100% 97% 98% 99%  Weight:       Weight change:   Intake/Output Summary (Last 24 hours) at 04/16/2020 1312 Last data filed at 04/16/2020 0500 Gross per 24 hour  Intake 692.76 ml  Output --  Net 692.76 ml    General: No acute distress, Abd: Soft, NT/ND, No HSM Skin: Warm, no rashes Neck: Supple, Trachea midline   Lab Results: Lab Results  Component Value Date   WBC 5.0 04/14/2020   HGB 13.7 04/14/2020   HCT 42.5 04/14/2020   MCV 90.8 04/14/2020   PLT 427 (H) 04/14/2020   Micro Results: Recent Results (from the past 240 hour(s))  SARS Coronavirus 2 by RT PCR (hospital order, performed in [facility] hospital lab) Nasopharyngeal Nasopharyngeal Swab     Status: None   Collection Time: 04/14/20  7:01 PM   Specimen: Nasopharyngeal Swab  Result Value Ref Range Status   SARS Coronavirus 2 NEGATIVE NEGATIVE Final    Comment: (NOTE) SARS-CoV-2 target nucleic acids are NOT DETECTED.  The SARS-CoV-2 RNA is generally detectable in upper and lower respiratory specimens during the acute phase of infection. The lowest concentration of SARS-CoV-2 viral copies this assay can detect is 250 copies / mL. A negative result does not preclude SARS-CoV-2 infection and should not be used as the sole basis for treatment or other patient management  decisions.  A negative result may occur with improper specimen collection / handling, submission of specimen other than nasopharyngeal swab, presence of viral mutation(s) within the areas targeted by this assay, and inadequate number of viral copies (<250 copies / mL). A negative result must be combined with clinical observations, patient history, and epidemiological information.  Fact Sheet for Patients:   04/16/20  Fact Sheet for Healthcare Providers: BoilerBrush.com.cy  This test is not yet approved or  cleared by the https://pope.com/ FDA and has been authorized for detection and/or diagnosis of SARS-CoV-2 by FDA under an Emergency Use Authorization (EUA).  This EUA will remain in effect (meaning this test can be used) for the duration of the COVID-19 declaration under Section 564(b)(1) of the Act, 21 U.S.C. section 360bbb-3(b)(1), unless the authorization is terminated or revoked sooner.  Performed at [facility], [address], [place], [place]    Studies/Results: DG Chest Portable 1 View  Result Date: 04/14/2020 CLINICAL DATA:  34 year old male with constipation. EXAM: PORTABLE CHEST 1 VIEW COMPARISON:  Chest radiograph dated 02/10/2007. FINDINGS: There is no focal consolidation, pleural effusion, or pneumothorax. The cardiac silhouette is within limits. No acute osseous pathology. IMPRESSION: No active disease. Electronically Signed   By: 02/12/2007 M.D.   On: 04/14/2020 16:00   DG Abd Portable 1V  Result Date: 04/14/2020 CLINICAL DATA:  34 year old male with constipation. EXAM: PORTABLE ABDOMEN - 1  VIEW COMPARISON:  None. FINDINGS: There is large amount of stool throughout the colon. No bowel dilatation or evidence of obstruction. No free air or radiopaque calculi. The osseous structures and soft tissues are grossly unremarkable. IMPRESSION: Constipation. No bowel obstruction. Electronically  Signed   By: Elgie Collard M.D.   On: 04/14/2020 15:59   Medications:  Scheduled Meds: . bisacodyl  10 mg Rectal BID  . chlorhexidine  15 mL Mouth Rinse BID  . enoxaparin (LOVENOX) injection  40 mg Subcutaneous Q24H  . felbamate  900 mg Oral Q12H  . mouth rinse  15 mL Mouth Rinse q12n4p  . sodium phosphate  1 enema Rectal Once   Continuous Infusions: . dextrose 5 % and 0.9% NaCl 75 mL/hr at 04/15/20 2000   PRN Meds:.acetaminophen **OR** acetaminophen, haloperidol lactate, ondansetron **OR** ondansetron (ZOFRAN) IV   Assessment: Principal Problem:   Constipation Active Problems:   Generalized convulsive epilepsy (HCC)   Autism spectrum disorder with accompanying language impairment and intellectual disability, requiring very substantial support   Decreased oral intake   Dysphagia    Plan: We will plan for EGD tomorrow for dysphagia N.p.o. past midnight  Due to ongoing constipation, with failed MiraLAX as an outpatient in the past, start Linzess.  Ordered.  Monitor for bowel movements.  Okay to use enema as needed if no bowel movements in 2 to 3 days   LOS: 1 day   [name], MD 04/16/2020, 1:12 PM

## 2020-04-16 NOTE — Evaluation (Signed)
Clinical/Bedside Swallow Evaluation Patient Details  Name: Jonathan Kirby MRN: [id number] Date of Birth: Jul 04, 1986  Today's Date: 04/16/2020 Time: SLP Start Time (ACUTE ONLY): 0902 SLP Stop Time (ACUTE ONLY): 0924 SLP Time Calculation (min) (ACUTE ONLY): 22 min  Past Medical History:  Past Medical History:  Diagnosis Date  . Autism   . GERD (gastroesophageal reflux disease)   . Seizures (HCC)    Past Surgical History:  Past Surgical History:  Procedure Laterality Date  . CIRCUMCISION  1987   HPI:  Jonathan Kirby is a 34 y.o. male with autism, nonverbal, history obtained from chart, parents at bedside, with GI consulted for constipation and dysphagia. Parents state, since March of this year patient has been having constipation and was thought to have acid reflux by doctors making house calls as mother reported increased elevation.  Protonix was started which helped, but parents feel like he may have had some trouble swallowing, but given that he is unable to provide history, symptoms are unclear.    Assessment / Plan / Recommendation Clinical Impression  Pt is at higher aspiration risk d/t his cognitive impairment related to his diagnosis of Autism, his mother's report of backflow of liquids post swallow and grimacing with swallow. During this evaluation, pt consumed small sips of thin liquids via straw with no overt s/s of aspiration but he did have intermittent oral holding and multiple swallows per bolus. More advanced trials were not assessed d/t pt currently on full liquid diet per GI. ST will follow for assessment of additional PO intake once GI deems appropriate.  SLP Visit Diagnosis: Dysphagia, unspecified (R13.10)    Aspiration Risk  Mild aspiration risk    Diet Recommendation   Thin liquids  Medication Administration: Via alternative means    Other  Recommendations Recommended Consults: Consider GI evaluation;Consider esophageal assessment Oral Care Recommendations: Oral  care BID   Follow up Recommendations  (TBD)      Frequency and Duration min 2x/week  2 weeks       Prognosis Prognosis for Safe Diet Advancement: Fair Barriers to Reach Goals: Cognitive deficits;Severity of deficits      Swallow Study   General Date of Onset: 04/15/20 HPI: Jonathan Kirby is a 34 y.o. male with autism, nonverbal, history obtained from chart, parents at bedside, with GI consulted for constipation and dysphagia. Parents state, since March of this year patient has been having constipation and was thought to have acid reflux by doctors making house calls as mother reported increased elevation.  Protonix was started which helped, but parents feel like he may have had some trouble swallowing, but given that he is unable to provide history, symptoms are unclear.  Type of Study: Bedside Swallow Evaluation Previous Swallow Assessment: none in chart Diet Prior to this Study: Thin liquids Temperature Spikes Noted: No Respiratory Status: Room air History of Recent Intubation: No Behavior/Cognition: Alert;Pleasant mood (nonverbal d/t Autism) Oral Cavity Assessment:  (differences at baseline, no changes) Oral Care Completed by SLP: No Oral Cavity - Dentition: Adequate natural dentition Self-Feeding Abilities: Total assist Patient Positioning: Upright in bed Baseline Vocal Quality:  (nonverbal d/t Autism) Volitional Cough: Cognitively unable to elicit Volitional Swallow: Unable to elicit    Oral/Motor/Sensory Function Overall Oral Motor/Sensory Function:  (baseline differences, no acute changes)   Ice Chips Ice chips: Not tested   Thin Liquid Thin Liquid: Within functional limits Presentation: Straw    Nectar Thick Nectar Thick Liquid: Not tested   Honey Thick Honey Thick Liquid:  Not tested   Puree Puree: Not tested Other Comments: d/t full liquid diet    Solid     Solid: Not tested Other Comments: d/t full liquid diet     Lyndall Bellot B. Dreama Saa M.S., CCC-SLP,  [facility] Speech-Language Pathologist Rehabilitation Services Office [phone]  Aran Menning Dreama Saa 04/16/2020,4:56 PM

## 2020-04-17 ENCOUNTER — Inpatient Hospital Stay: Payer: Medicaid Other

## 2020-04-17 ENCOUNTER — Encounter
Admission: EM | Disposition: A | Discharge status: Home / Self Care | Payer: Self-pay | Source: Home / Self Care | Attending: Internal Medicine

## 2020-04-17 ENCOUNTER — Encounter: Payer: Self-pay | Admitting: Internal Medicine

## 2020-04-17 ENCOUNTER — Inpatient Hospital Stay: Payer: Medicaid Other | Admitting: Anesthesiology

## 2020-04-17 DIAGNOSIS — K209 Esophagitis, unspecified without bleeding: Secondary | ICD-10-CM | POA: Diagnosis not present

## 2020-04-17 DIAGNOSIS — K5900 Constipation, unspecified: Secondary | ICD-10-CM | POA: Diagnosis not present

## 2020-04-17 DIAGNOSIS — F840 Autistic disorder: Secondary | ICD-10-CM | POA: Diagnosis not present

## 2020-04-17 DIAGNOSIS — R1310 Dysphagia, unspecified: Secondary | ICD-10-CM | POA: Diagnosis not present

## 2020-04-17 HISTORY — PX: ESOPHAGOGASTRODUODENOSCOPY: SHX5428

## 2020-04-17 LAB — BASIC METABOLIC PANEL
Anion gap: 9 (ref 5–15)
BUN: 18 mg/dL (ref 6–20)
CO2: 26 mmol/L (ref 22–32)
Calcium: 8.2 mg/dL — ABNORMAL LOW (ref 8.9–10.3)
Chloride: 106 mmol/L (ref 98–111)
Creatinine, Ser: 0.68 mg/dL (ref 0.61–1.24)
GFR calc Af Amer: >60 mL/min (ref >60)
GFR calc non Af Amer: >60 mL/min (ref >60)
Glucose, Bld: 103 mg/dL — ABNORMAL HIGH (ref 70–99)
Potassium: 3.2 mmol/L — ABNORMAL LOW (ref 3.5–5.1)
Sodium: 141 mmol/L (ref 135–145)

## 2020-04-17 LAB — CBC
HCT: 34.5 % — ABNORMAL LOW (ref 39.0–52.0)
Hemoglobin: 12 g/dL — ABNORMAL LOW (ref 13.0–17.0)
MCH: 30.1 pg (ref 26.0–34.0)
MCHC: 34.8 g/dL (ref 30.0–36.0)
MCV: 86.5 fL (ref 80.0–100.0)
Platelets: 325 10*3/uL (ref 150–400)
RBC: 3.99 MIL/uL — ABNORMAL LOW (ref 4.22–5.81)
RDW: 14.2 % (ref 11.5–15.5)
WBC: 6.4 10*3/uL (ref 4.0–10.5)
nRBC: 0 % (ref 0.0–0.2)

## 2020-04-17 SURGERY — EGD (ESOPHAGOGASTRODUODENOSCOPY)
Anesthesia: General

## 2020-04-17 MED ORDER — LIDOCAINE HCL (CARDIAC) PF 100 MG/5ML IV SOSY
PREFILLED_SYRINGE | INTRAVENOUS | Status: DC | PRN
  Administered 2020-04-17: 40 mg via INTRAVENOUS

## 2020-04-17 MED ORDER — PROPOFOL 500 MG/50ML IV EMUL
INTRAVENOUS | Status: DC | PRN
  Administered 2020-04-17: 150 ug/kg/min via INTRAVENOUS

## 2020-04-17 MED ORDER — SODIUM CHLORIDE 0.9 % IV SOLN: INTRAVENOUS | Status: DC

## 2020-04-17 MED ORDER — POTASSIUM CHLORIDE 10 MEQ/100ML IV SOLN
10.0000 meq | INTRAVENOUS | Status: AC
  Administered 2020-04-17 (×2): 10 meq via INTRAVENOUS
  Filled 2020-04-17: qty 100

## 2020-04-17 MED ORDER — MAGNESIUM CITRATE PO SOLN
1.0000 | Freq: Once | ORAL | Status: DC | PRN
  Filled 2020-04-17: qty 296

## 2020-04-17 MED ORDER — PROPOFOL 500 MG/50ML IV EMUL
INTRAVENOUS | Status: AC
  Filled 2020-04-17: qty 50

## 2020-04-17 MED ORDER — PANTOPRAZOLE SODIUM 40 MG PO TBEC
40.0000 mg | DELAYED_RELEASE_TABLET | Freq: Two times a day (BID) | ORAL | Status: DC
  Administered 2020-04-17 – 2020-04-19 (×5): 40 mg via ORAL
  Filled 2020-04-17 (×5): qty 1

## 2020-04-17 MED ORDER — LIDOCAINE HCL (PF) 2 % IJ SOLN
INTRAMUSCULAR | Status: AC
  Filled 2020-04-17: qty 5

## 2020-04-17 NOTE — OR Nursing (Signed)
Patient transported to room 144 via bed by Meribeth Mattes CNA.

## 2020-04-17 NOTE — Transfer of Care (Signed)
Immediate Anesthesia Transfer of Care Note  Patient: Jonathan Kirby  Procedure(s) Performed: ESOPHAGOGASTRODUODENOSCOPY (EGD) (N/A )  Patient Location: PACU  Anesthesia Type:General  Level of Consciousness: awake and sedated  Airway & Oxygen Therapy: Patient Spontanous Breathing and Patient connected to nasal cannula oxygen  Post-op Assessment: Report given to RN and Post -op Vital signs reviewed and stable  Post vital signs: Reviewed and stable  Last Vitals:  Vitals Value Taken Time  BP    Temp    Pulse    Resp    SpO2      Last Pain:  Vitals:   04/16/20 2116  TempSrc: Oral  PainSc:          Complications: No complications documented.

## 2020-04-17 NOTE — Op Note (Signed)
[facility] Gastroenterology Patient Name: Jonathan Kirby Procedure Date: 04/17/2020 11:52 AM MRN: [id number] Account #: [id number] Date of Birth: April 17, 1986 Admit Type: Inpatient Age: 34 Room: [facility] - Inpatient ENDO ROOM 3 Gender: Male Note Status: Finalized Procedure:             Upper GI endoscopy Indications:           Dysphagia Providers:             Khyla Mccumbers B. Maximino Greenland MD, MD Referring MD:          [facility] (Referring MD) Medicines:             Monitored Anesthesia Care Complications:         No immediate complications. Procedure:             Pre-Anesthesia Assessment:                        - The risks and benefits of the procedure and the                         sedation options and risks were discussed with the                         patient. All questions were answered and informed                         consent was obtained.                        - Patient identification and proposed procedure were                         verified prior to the procedure.                        - ASA Grade Assessment: II - A patient with mild                         systemic disease.                        After obtaining informed consent, the endoscope was                         passed under direct vision. Throughout the procedure,                         the patient's blood pressure, pulse, and oxygen                         saturations were monitored continuously. The Endoscope                         was introduced through the mouth, and advanced to the                         second part of duodenum. The upper GI endoscopy was                         accomplished with ease. The  patient tolerated the                         procedure well. Findings:      LA Grade C (one or more mucosal breaks continuous between tops of 2 or       more mucosal folds, less than 75% circumference) esophagitis with no       bleeding was found in the distal esophagus. Biopsies  were obtained from       the proximal and distal esophagus with cold forceps for histology of       suspected eosinophilic esophagitis.      A single 7 mm mucosal nodule with a localized distribution was found in       the proximal esophagus, 18 cm from the incisors. Biopsies were taken       with a cold forceps for histology.      A small hiatal hernia was present.      A few 3 to 4 mm sessile polyps with no bleeding and no stigmata of       recent bleeding were found in the gastric body. Biopsies were taken with       a cold forceps for histology.      The exam of the stomach was otherwise normal.      The examined duodenum was normal. Impression:            - LA Grade C reflux esophagitis with no bleeding.                         Biopsied.                        - Mucosal nodule found in the esophagus. Biopsied.                        - Small hiatal hernia.                        - A few gastric polyps. Biopsied.                        - Normal examined duodenum. Recommendation:        - Use Protonix (pantoprazole) 40 mg PO BID for 4 weeks.                        - Follow an antireflux regimen.                        - Await pathology results.                        - Return patient to hospital ward for ongoing care.                        - Continue present medications.                        - The findings and recommendations were discussed with                         the patient.                        -  The findings and recommendations were discussed with                         the patient's family. Procedure Code(s):     --- Professional ---                        [phone], Esophagogastroduodenoscopy, flexible,                         transoral; with biopsy, single or multiple Diagnosis Code(s):     --- Professional ---                        K21.00, Gastro-esophageal reflux disease with                         esophagitis, without bleeding                        K22.8, Other  specified diseases of esophagus                        K44.9, Diaphragmatic hernia without obstruction or                         gangrene                        K31.7, Polyp of stomach and duodenum                        R13.10, Dysphagia, unspecified CPT copyright 2019 American Medical Association. All rights reserved. The codes documented in this report are preliminary and upon coder review may  be revised to meet current compliance requirements.  [name], MD Michel Bickers B. Maximino Greenland MD, MD 04/17/2020 12:27:07 PM This report has been signed electronically. Number of Addenda: 0 Note Initiated On: 04/17/2020 11:52 AM Estimated Blood Loss:  Estimated blood loss: none.      [facility]

## 2020-04-17 NOTE — Progress Notes (Signed)
PROGRESS NOTE    Jonathan Kirby  FYB:[id number] DOB: [birth year] DOA: 04/14/2020 PCP: [facility]   Assessment & Plan:   Principal Problem:   Constipation Active Problems:   Generalized convulsive epilepsy (HCC)   Autism spectrum disorder with accompanying language impairment and intellectual disability, requiring very substantial support   Decreased oral intake   Dysphagia  Acute on chronic constipation: had a small bowel movement after enema. Continue on linaclotide, dulcolax. Repeat XR abd ordered  Dysphagia:  S/p EGD 04/17/20 showed grade C reflux esophagitis, biopsied; mucosal nodule found in esophagus, biopsied; gastric polyps, biopsied. Continue on protonix 40mg  BID x 4 weeks as per GI   Hypokalemia: KCl repleted again. Will continue to monitor   Seizure disorder: unknown type or severity. Continue on felbatol   Autism: nonverbal. Continue w/ supportive care   Thrombocytosis: resolved    DVT prophylaxis: lovenox Code Status: full  Family Communication: discussed pt's care w/ pt's parents who both are at bedside and answered their questions  Disposition Plan: likely d/c back home w/ family care   Consultants:   GI   Procedures:    Antimicrobials:   Subjective: Pt is nonverbal secondary to autism. Pt appears uncomfortable secondary to abd distention.   Objective: Vitals:   04/16/20 0549 04/16/20 0935 04/16/20 1605 04/16/20 2116  BP: 103/75 103/81 107/80 (!) 121/96  Pulse: 103 89 79 71  Resp: 16 17 15 18   Temp: 97.8 F (36.6 C) 97.6 F (36.4 C) (!) 97.4 F (36.3 C) 98 F (36.7 C)  TempSrc: Oral Oral Oral Oral  SpO2: 98% 99% 100% 99%  Weight:   47.3 kg   Height:   5\' 5"  (1.651 m)     Intake/Output Summary (Last 24 hours) at 04/17/2020 0804 Last data filed at 04/17/2020 0200 Gross per 24 hour  Intake 1274.38 ml  Output --  Net 1274.38 ml   Filed Weights   04/14/20 1440 04/16/20 1605  Weight: 54.4 kg 47.3 kg     Examination:  General exam: Appears calm but uncomfortable  Respiratory system: diminished breath sounds b/l. No rhonchi Cardiovascular system: S1 & S2+. No rubs, gallops or clicks. No pedal edema. Gastrointestinal system: Abdomen is distended, soft and tenderness to palpation. Hypoactive bowel sounds heard. Central nervous system: Awake. Moves all 4 extremities  Psychiatry: Judgement and insight appear abnormal. Flat mood and affect.     Data Reviewed: I have personally reviewed following labs and imaging studies  CBC: Recent Labs  Lab 04/14/20 1453 04/17/20 0423  WBC 5.0 6.4  HGB 13.7 12.0*  HCT 42.5 34.5*  MCV 90.8 86.5  PLT 427* 325   Basic Metabolic Panel: Recent Labs  Lab 04/14/20 1453 04/17/20 0423  NA 140 141  K 3.8 3.2*  CL 100 106  CO2 25 26  GLUCOSE 141* 103*  BUN 32* 18  CREATININE 1.02 0.68  CALCIUM 9.0 8.2*   GFR: Estimated Creatinine Clearance: 87 mL/min (by C-G formula based on SCr of 0.68 mg/dL). Liver Function Tests: Recent Labs  Lab 04/14/20 1453  AST 41  ALT 46*  ALKPHOS 91  BILITOT 1.1  PROT 9.0*  ALBUMIN 4.3   Recent Labs  Lab 04/14/20 1453  LIPASE 22   No results for input(s): AMMONIA in the last 168 hours. Coagulation Profile: No results for input(s): INR, PROTIME in the last 168 hours. Cardiac Enzymes: No results for input(s): CKTOTAL, CKMB, CKMBINDEX, TROPONINI in the last 168 hours. BNP (last 3 results) No results for  input(s): PROBNP in the last 8760 hours. HbA1C: No results for input(s): HGBA1C in the last 72 hours. CBG: No results for input(s): GLUCAP in the last 168 hours. Lipid Profile: No results for input(s): CHOL, HDL, LDLCALC, TRIG, CHOLHDL, LDLDIRECT in the last 72 hours. Thyroid Function Tests: No results for input(s): TSH, T4TOTAL, FREET4, T3FREE, THYROIDAB in the last 72 hours. Anemia Panel: No results for input(s): VITAMINB12, FOLATE, FERRITIN, TIBC, IRON, RETICCTPCT in the last 72 hours. Sepsis  Labs: No results for input(s): PROCALCITON, LATICACIDVEN in the last 168 hours.  Recent Results (from the past 240 hour(s))  SARS Coronavirus 2 by RT PCR (hospital order, performed in [facility] hospital lab) Nasopharyngeal Nasopharyngeal Swab     Status: None   Collection Time: 04/14/20  7:01 PM   Specimen: Nasopharyngeal Swab  Result Value Ref Range Status   SARS Coronavirus 2 NEGATIVE NEGATIVE Final    Comment: (NOTE) SARS-CoV-2 target nucleic acids are NOT DETECTED.  The SARS-CoV-2 RNA is generally detectable in upper and lower respiratory specimens during the acute phase of infection. The lowest concentration of SARS-CoV-2 viral copies this assay can detect is 250 copies / mL. A negative result does not preclude SARS-CoV-2 infection and should not be used as the sole basis for treatment or other patient management decisions.  A negative result may occur with improper specimen collection / handling, submission of specimen other than nasopharyngeal swab, presence of viral mutation(s) within the areas targeted by this assay, and inadequate number of viral copies (<250 copies / mL). A negative result must be combined with clinical observations, patient history, and epidemiological information.  Fact Sheet for Patients:   BoilerBrush.com.cy  Fact Sheet for Healthcare Providers: https://pope.com/  This test is not yet approved or  cleared by the [place] FDA and has been authorized for detection and/or diagnosis of SARS-CoV-2 by FDA under an Emergency Use Authorization (EUA).  This EUA will remain in effect (meaning this test can be used) for the duration of the COVID-19 declaration under Section 564(b)(1) of the Act, 21 U.S.C. section 360bbb-3(b)(1), unless the authorization is terminated or revoked sooner.  Performed at [facility], [address], [place], [place] [address]          Radiology Studies: No  results found.      Scheduled Meds: . bisacodyl  10 mg Rectal BID  . chlorhexidine  15 mL Mouth Rinse BID  . enoxaparin (LOVENOX) injection  40 mg Subcutaneous Q24H  . felbamate  900 mg Oral Q12H  . linaclotide  290 mcg Oral QAC breakfast  . mouth rinse  15 mL Mouth Rinse q12n4p  . sodium phosphate  1 enema Rectal Once   Continuous Infusions: . dextrose 5 % and 0.9% NaCl 75 mL/hr at 04/17/20 0246  . potassium chloride       LOS: 2 days    Time spent: 31 mins     Charise Killian, MD [facility] Pager [phone]  If 7PM-7AM, please contact night-coverage [email or web address] 04/17/2020, 8:04 AM

## 2020-04-17 NOTE — Progress Notes (Signed)
[name], MD [address], [place], [place], [address] [address], [address], [place], [place], [address] Phone: [phone]  Fax: [phone]   Subjective: No acute events overnight.  Speech therapy evaluated him yesterday.  History limited due to severe autism   Objective: Exam: Vital signs in last 24 hours: Vitals:   04/16/20 0549 04/16/20 0935 04/16/20 1605 04/16/20 2116  BP: 103/75 103/81 107/80 (!) 121/96  Pulse: 103 89 79 71  Resp: 16 17 15 18   Temp: 97.8 F (36.6 C) 97.6 F (36.4 C) (!) 97.4 F (36.3 C) 98 F (36.7 C)  TempSrc: Oral Oral Oral Oral  SpO2: 98% 99% 100% 99%  Weight:   47.3 kg   Height:   5\' 5"  (1.651 m)    Weight change:   Intake/Output Summary (Last 24 hours) at 04/17/2020 0858 Last data filed at 04/17/2020 0200 Gross per 24 hour  Intake 1274.38 ml  Output --  Net 1274.38 ml    General: No acute distress Abd: Soft, NT/ND, No HSM Skin: Warm, no rashes Neck: Supple, Trachea midline   Lab Results: Lab Results  Component Value Date   WBC 6.4 04/17/2020   HGB 12.0 (L) 04/17/2020   HCT 34.5 (L) 04/17/2020   MCV 86.5 04/17/2020   PLT 325 04/17/2020   Micro Results: Recent Results (from the past 240 hour(s))  SARS Coronavirus 2 by RT PCR (hospital order, performed in [facility] hospital lab) Nasopharyngeal Nasopharyngeal Swab     Status: None   Collection Time: 04/14/20  7:01 PM   Specimen: Nasopharyngeal Swab  Result Value Ref Range Status   SARS Coronavirus 2 NEGATIVE NEGATIVE Final    Comment: (NOTE) SARS-CoV-2 target nucleic acids are NOT DETECTED.  The SARS-CoV-2 RNA is generally detectable in upper and lower respiratory specimens during the acute phase of infection. The lowest concentration of SARS-CoV-2 viral copies this assay can detect is 250 copies / mL. A negative result does not preclude SARS-CoV-2 infection and should not be used as the sole basis for treatment or other patient management  decisions.  A negative result may occur with improper specimen collection / handling, submission of specimen other than nasopharyngeal swab, presence of viral mutation(s) within the areas targeted by this assay, and inadequate number of viral copies (<250 copies / mL). A negative result must be combined with clinical observations, patient history, and epidemiological information.  Fact Sheet for Patients:   [facility]  Fact Sheet for Healthcare Providers: 04/16/20  This test is not yet approved or  cleared by the [employer] FDA and has been authorized for detection and/or diagnosis of SARS-CoV-2 by FDA under an Emergency Use Authorization (EUA).  This EUA will remain in effect (meaning this test can be used) for the duration of the COVID-19 declaration under Section 564(b)(1) of the Act, 21 U.S.C. section 360bbb-3(b)(1), unless the authorization is terminated or revoked sooner.  Performed at [facility], [address] [place], [address] [place]    Studies/Results: No results found. Medications:  Scheduled Meds: . bisacodyl  10 mg Rectal BID  . chlorhexidine  15 mL Mouth Rinse BID  . enoxaparin (LOVENOX) injection  40 mg Subcutaneous Q24H  . felbamate  900 mg Oral Q12H  . linaclotide  290 mcg Oral QAC breakfast  . mouth rinse  15 mL Mouth Rinse q12n4p  . sodium phosphate  1 enema Rectal Once   Continuous Infusions: . dextrose 5 % and 0.9% NaCl 75 mL/hr at 04/17/20  0246  . 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  See their clinical impression  Proceed with EGD today to evaluate for dysphagia and rule out any underlying lesions  Linzess was started yesterday due to constipation and inability to tolerate other oral medications, including liquids as patient does not like drinking them, and difficult to have him drinking given his severe autism and unable to appropriately comply with instructions.  Monitor for bowel movements    LOS: 2 days   [name], MD 04/17/2020, 8:58 AM

## 2020-04-17 NOTE — Progress Notes (Signed)
IV got infiltrated, family was informed to insert a new IV for fluid infusion but family was hesitant to have a new IV. MD was notified and was ok to have no IV, fluid was DC.

## 2020-04-17 NOTE — Anesthesia Preprocedure Evaluation (Signed)
Anesthesia Evaluation  Patient identified by MRN, date of birth, ID band Patient awake    Reviewed: Allergy & Precautions, H&P , NPO status , Patient's Chart, lab work & pertinent test results, reviewed documented beta blocker date and time   History of Anesthesia Complications Negative for: history of anesthetic complications  Airway Mallampati: II  TM Distance: >3 FB Neck ROM: full    Dental  (+) Dental Advidsory Given, Poor Dentition, Missing   Pulmonary neg pulmonary ROS,    Pulmonary exam normal breath sounds clear to auscultation       Cardiovascular Exercise Tolerance: Good (-) hypertension(-) angina(-) CAD, (-) Past MI and (-) Cardiac Stents Normal cardiovascular exam(-) dysrhythmias + Valvular Problems/Murmurs  Rhythm:regular Rate:Normal     Neuro/Psych Seizures -,  PSYCHIATRIC DISORDERS    GI/Hepatic Neg liver ROS, GERD  ,  Endo/Other  negative endocrine ROS  Renal/GU negative Renal ROS  negative genitourinary   Musculoskeletal   Abdominal   Peds  Hematology negative hematology ROS (+)   Anesthesia Other Findings Past Medical History: No date: Autism No date: GERD (gastroesophageal reflux disease) No date: Seizures (HCC)   Reproductive/Obstetrics negative OB ROS                             Anesthesia Physical Anesthesia Plan  ASA: II  Anesthesia Plan: General   Post-op Pain Management:    Induction: Intravenous  PONV Risk Score and Plan: 2 and Propofol infusion and TIVA  Airway Management Planned: Natural Airway and Nasal Cannula  Additional Equipment:   Intra-op Plan:   Post-operative Plan:   Informed Consent: I have reviewed the patients History and Physical, chart, labs and discussed the procedure including the risks, benefits and alternatives for the proposed anesthesia with the patient or authorized representative who has indicated his/her understanding and  acceptance.     Dental Advisory Given  Plan Discussed with: Anesthesiologist, CRNA and Surgeon  Anesthesia Plan Comments:         Anesthesia Quick Evaluation

## 2020-04-17 NOTE — Anesthesia Procedure Notes (Signed)
Performed by: Cook-Martin, Naquisha Whitehair Pre-anesthesia Checklist: Patient identified, Emergency Drugs available, Suction available and Patient being monitored Patient Re-evaluated:Patient Re-evaluated prior to induction Oxygen Delivery Method: Nasal cannula Preoxygenation: Pre-oxygenation with 100% oxygen Induction Type: IV induction Airway Equipment and Method: Bite block Placement Confirmation: positive ETCO2 and CO2 detector       

## 2020-04-17 NOTE — Progress Notes (Signed)
SLP Cancellation Note  Patient Details Name: RAFEL GARDE MRN: [id number] DOB: 08/02/1986   Cancelled treatment:       Reason Eval/Treat Not Completed: Medical issues which prohibited therapy  Pt with EGD scheduled. Will continue following pt for further recommendations from GI.   [name] B. Dreama Saa M.S., CCC-SLP, [facility] Speech-Language Pathologist Rehabilitation Services Office [phone]   [name] 04/17/2020, 11:36 AM

## 2020-04-18 ENCOUNTER — Inpatient Hospital Stay: Payer: Medicaid Other

## 2020-04-18 DIAGNOSIS — K5900 Constipation, unspecified: Secondary | ICD-10-CM | POA: Diagnosis not present

## 2020-04-18 DIAGNOSIS — F840 Autistic disorder: Secondary | ICD-10-CM | POA: Diagnosis not present

## 2020-04-18 DIAGNOSIS — R1310 Dysphagia, unspecified: Secondary | ICD-10-CM | POA: Diagnosis not present

## 2020-04-18 LAB — BASIC METABOLIC PANEL
Anion gap: 9 (ref 5–15)
BUN: 17 mg/dL (ref 6–20)
CO2: 25 mmol/L (ref 22–32)
Calcium: 8.5 mg/dL — ABNORMAL LOW (ref 8.9–10.3)
Chloride: 108 mmol/L (ref 98–111)
Creatinine, Ser: 0.77 mg/dL (ref 0.61–1.24)
GFR calc Af Amer: >60 mL/min (ref >60)
GFR calc non Af Amer: >60 mL/min (ref >60)
Glucose, Bld: 93 mg/dL (ref 70–99)
Potassium: 4 mmol/L (ref 3.5–5.1)
Sodium: 142 mmol/L (ref 135–145)

## 2020-04-18 LAB — CBC
HCT: 33.6 % — ABNORMAL LOW (ref 39.0–52.0)
Hemoglobin: 11.3 g/dL — ABNORMAL LOW (ref 13.0–17.0)
MCH: 29 pg (ref 26.0–34.0)
MCHC: 33.6 g/dL (ref 30.0–36.0)
MCV: 86.4 fL (ref 80.0–100.0)
Platelets: 311 10*3/uL (ref 150–400)
RBC: 3.89 MIL/uL — ABNORMAL LOW (ref 4.22–5.81)
RDW: 14.3 % (ref 11.5–15.5)
WBC: 7.1 10*3/uL (ref 4.0–10.5)
nRBC: 0 % (ref 0.0–0.2)

## 2020-04-18 LAB — MAGNESIUM: Magnesium: 1.8 mg/dL (ref 1.7–2.4)

## 2020-04-18 LAB — SURGICAL PATHOLOGY

## 2020-04-18 MED ORDER — HALOPERIDOL LACTATE 5 MG/ML IJ SOLN
2.5000 mg | Freq: Four times a day (QID) | INTRAMUSCULAR | Status: DC | PRN
  Administered 2020-04-18: 2.5 mg via INTRAMUSCULAR
  Filled 2020-04-18: qty 1

## 2020-04-18 MED ORDER — DEXTROSE-NACL 5-0.9 % IV SOLN: INTRAVENOUS | Status: DC

## 2020-04-18 NOTE — Progress Notes (Signed)
PROGRESS NOTE    Jonathan Kirby  HYQ:[id number] DOB: [birth year] DOA: 04/14/2020 PCP: [facility]   Assessment & Plan:   Principal Problem:   Constipation Active Problems:   Generalized convulsive epilepsy (HCC)   Autism spectrum disorder with accompanying language impairment and intellectual disability, requiring very substantial support   Decreased oral intake   Dysphagia   Acute esophagitis  Acute on chronic constipation: still w/ significant amount of stool in colon. CT abd/pelvis shows colon diffusely distended by gas & stool.  Continue on linzess, dulcolax. Repeat XR abd shows likely ileus   Dysphagia:  S/p EGD 04/17/20 showed grade C reflux esophagitis, biopsied; mucosal nodule found in esophagus, biopsied; gastric polyps, biopsied. Continue on protonix 40mg  BID x 4 weeks as per GI. F/u GI w/ 6-8 weeks  Hypokalemia: WNL today. Will continue to monitor   Seizure disorder: unknown type or severity. Continue on felbatol   Autism: nonverbal. Continue w/ supportive care   Thrombocytosis: resolved    DVT prophylaxis: lovenox Code Status: full  Family Communication: discussed pt's care w/ pt's parents who both are at bedside and answered their questions  Disposition Plan: likely d/c back home w/ family care   Consultants:   GI   Procedures:    Antimicrobials:   Subjective: Pt is does not talk secondary to autism. Pt appears uncomfortable secondary to abd distention.   Objective: Vitals:   04/17/20 1226 04/17/20 1324 04/17/20 1540 04/18/20 0730  BP: (!) 96/62 (!) 152/105 (!) 129/89 (!) 100/59  Pulse: 73 59 79 71  Resp: 16 17 18 18   Temp:  98 F (36.7 C) 98.4 F (36.9 C) 97.7 F (36.5 C)  TempSrc:  Oral Oral Oral  SpO2: 99% 100% 100% 99%  Weight:      Height:        Intake/Output Summary (Last 24 hours) at 04/18/2020 0758 Last data filed at 04/17/2020 1204 Gross per 24 hour  Intake --  Output 0 ml  Net 0 ml   Filed Weights   04/14/20  1440 04/16/20 1605  Weight: 54.4 kg 47.3 kg    Examination:  General exam: Appears calm but uncomfortable  Respiratory system: decreased breath sounds b/l. No rales, wheezes Cardiovascular system: S1 & S2+. No rubs, gallops or clicks. No pedal edema. Gastrointestinal system: Abdomen is distended, soft and tenderness to palpation. Normal bowel sounds heard. Central nervous system: Awake. Moves all 4 extremities  Psychiatry: Judgement and insight appear abnormal. Flat mood and affect.     Data Reviewed: I have personally reviewed following labs and imaging studies  CBC: Recent Labs  Lab 04/14/20 1453 04/17/20 0423 04/18/20 0411  WBC 5.0 6.4 7.1  HGB 13.7 12.0* 11.3*  HCT 42.5 34.5* 33.6*  MCV 90.8 86.5 86.4  PLT 427* 325 311   Basic Metabolic Panel: Recent Labs  Lab 04/14/20 1453 04/17/20 0423 04/18/20 0411  NA 140 141 142  K 3.8 3.2* 4.0  CL 100 106 108  CO2 25 26 25   GLUCOSE 141* 103* 93  BUN 32* 18 17  CREATININE 1.02 0.68 0.77  CALCIUM 9.0 8.2* 8.5*  MG  --   --  1.8   GFR: Estimated Creatinine Clearance: 87 mL/min (by C-G formula based on SCr of 0.77 mg/dL). Liver Function Tests: Recent Labs  Lab 04/14/20 1453  AST 41  ALT 46*  ALKPHOS 91  BILITOT 1.1  PROT 9.0*  ALBUMIN 4.3   Recent Labs  Lab 04/14/20 1453  LIPASE 22  No results for input(s): AMMONIA in the last 168 hours. Coagulation Profile: No results for input(s): INR, PROTIME in the last 168 hours. Cardiac Enzymes: No results for input(s): CKTOTAL, CKMB, CKMBINDEX, TROPONINI in the last 168 hours. BNP (last 3 results) No results for input(s): PROBNP in the last 8760 hours. HbA1C: No results for input(s): HGBA1C in the last 72 hours. CBG: No results for input(s): GLUCAP in the last 168 hours. Lipid Profile: No results for input(s): CHOL, HDL, LDLCALC, TRIG, CHOLHDL, LDLDIRECT in the last 72 hours. Thyroid Function Tests: No results for input(s): TSH, T4TOTAL, FREET4, T3FREE,  THYROIDAB in the last 72 hours. Anemia Panel: No results for input(s): VITAMINB12, FOLATE, FERRITIN, TIBC, IRON, RETICCTPCT in the last 72 hours. Sepsis Labs: No results for input(s): PROCALCITON, LATICACIDVEN in the last 168 hours.  Recent Results (from the past 240 hour(s))  SARS Coronavirus 2 by RT PCR (hospital order, performed in [facility] hospital lab) Nasopharyngeal Nasopharyngeal Swab     Status: None   Collection Time: 04/14/20  7:01 PM   Specimen: Nasopharyngeal Swab  Result Value Ref Range Status   SARS Coronavirus 2 NEGATIVE NEGATIVE Final    Comment: (NOTE) SARS-CoV-2 target nucleic acids are NOT DETECTED.  The SARS-CoV-2 RNA is generally detectable in upper and lower respiratory specimens during the acute phase of infection. The lowest concentration of SARS-CoV-2 viral copies this assay can detect is 250 copies / mL. A negative result does not preclude SARS-CoV-2 infection and should not be used as the sole basis for treatment or other patient management decisions.  A negative result may occur with improper specimen collection / handling, submission of specimen other than nasopharyngeal swab, presence of viral mutation(s) within the areas targeted by this assay, and inadequate number of viral copies (<250 copies / mL). A negative result must be combined with clinical observations, patient history, and epidemiological information.  Fact Sheet for Patients:   [employer]  Fact Sheet for Healthcare Providers: https://pope.com/  This test is not yet approved or  cleared by the [place] FDA and has been authorized for detection and/or diagnosis of SARS-CoV-2 by FDA under an Emergency Use Authorization (EUA).  This EUA will remain in effect (meaning this test can be used) for the duration of the COVID-19 declaration under Section 564(b)(1) of the Act, 21 U.S.C. section 360bbb-3(b)(1), unless the authorization  is terminated or revoked sooner.  Performed at [facility], [address] [place], [place] [address]          Radiology Studies: DG Abd Portable 1V  Result Date: 04/17/2020 CLINICAL DATA:  Abdominal distension EXAM: PORTABLE ABDOMEN - 1 VIEW COMPARISON:  04/14/2020 FINDINGS: There is gaseous distension of large bowel throughout the abdomen, progressed from previous study. Large volume of stool within the right hemicolon. No free intraperitoneal air is evident on supine imaging. Osseous structures intact. Lung bases clear. IMPRESSION: 1. Gaseous distension of large bowel throughout the abdomen, progressed from previous study. Findings are favored to be secondary to ileus. If there is concern for obstruction, a CT would provide further characterization. 2. Large volume of stool within the right hemicolon. Electronically Signed   By: Duanne Guess D.O.   On: 04/17/2020 14:06        Scheduled Meds: . bisacodyl  10 mg Rectal BID  . chlorhexidine  15 mL Mouth Rinse BID  . enoxaparin (LOVENOX) injection  40 mg Subcutaneous Q24H  . felbamate  900 mg Oral Q12H  . linaclotide  290 mcg  Oral QAC breakfast  . mouth rinse  15 mL Mouth Rinse q12n4p  . pantoprazole  40 mg Oral BID  . sodium phosphate  1 enema Rectal Once   Continuous Infusions:    LOS: 3 days    Time spent: 33 mins     Charise Killian, MD [facility] Pager [phone]  If 7PM-7AM, please contact night-coverage [email or web address] 04/18/2020, 7:58 AM

## 2020-04-18 NOTE — Progress Notes (Signed)
[name], MD [address], [place], [place], [address] [address], [place], [place], [address] Phone: [phone]  Fax: [phone]   Subjective:  Patient laying in bed comfortably with family at bedside. Pt has had good BMs since starting linzess  Objective: Exam: Vital signs in last 24 hours: Vitals:   04/17/20 1226 04/17/20 1324 04/17/20 1540 04/18/20 0730  BP: (!) 96/62 (!) 152/105 (!) 129/89 (!) 100/59  Pulse: 73 59 79 71  Resp: 16 17 18 18   Temp:  98 F (36.7 C) 98.4 F (36.9 C) 97.7 F (36.5 C)  TempSrc:  Oral Oral Oral  SpO2: 99% 100% 100% 99%  Weight:      Height:       Weight change:   Intake/Output Summary (Last 24 hours) at 04/18/2020 0753 Last data filed at 04/17/2020 1204 Gross per 24 hour  Intake --  Output 0 ml  Net 0 ml    General: No acute distress, Abd: Soft, NT/ND, No HSM Skin: Warm, no rashes Neck: Supple, Trachea midline   Lab Results: Lab Results  Component Value Date   WBC 7.1 04/18/2020   HGB 11.3 (L) 04/18/2020   HCT 33.6 (L) 04/18/2020   MCV 86.4 04/18/2020   PLT 311 04/18/2020   Micro Results: Recent Results (from the past 240 hour(s))  SARS Coronavirus 2 by RT PCR (hospital order, performed in [facility] hospital lab) Nasopharyngeal Nasopharyngeal Swab     Status: None   Collection Time: 04/14/20  7:01 PM   Specimen: Nasopharyngeal Swab  Result Value Ref Range Status   SARS Coronavirus 2 NEGATIVE NEGATIVE Final    Comment: (NOTE) SARS-CoV-2 target nucleic acids are NOT DETECTED.  The SARS-CoV-2 RNA is generally detectable in upper and lower respiratory specimens during the acute phase of infection. The lowest concentration of SARS-CoV-2 viral copies this assay can detect is 250 copies / mL. A negative result does not preclude SARS-CoV-2 infection and should not be used as the sole basis for treatment or other patient management decisions.  A negative result may occur with improper  specimen collection / handling, submission of specimen other than nasopharyngeal swab, presence of viral mutation(s) within the areas targeted by this assay, and inadequate number of viral copies (<250 copies / mL). A negative result must be combined with clinical observations, patient history, and epidemiological information.  Fact Sheet for Patients:   04/16/20  Fact Sheet for Healthcare Providers: [employer]  This test is not yet approved or  cleared by the https://pope.com/ FDA and has been authorized for detection and/or diagnosis of SARS-CoV-2 by FDA under an Emergency Use Authorization (EUA).  This EUA will remain in effect (meaning this test can be used) for the duration of the COVID-19 declaration under Section 564(b)(1) of the Act, 21 U.S.C. section 360bbb-3(b)(1), unless the authorization is terminated or revoked sooner.  Performed at [facility], [address] [place], [place]    Studies/Results: [address] Abd Portable 1V  Result Date: 04/17/2020 CLINICAL DATA:  Abdominal distension EXAM: PORTABLE ABDOMEN - 1 VIEW COMPARISON:  04/14/2020 FINDINGS: There is gaseous distension of large bowel throughout the abdomen, progressed from previous study. Large volume of stool within the right hemicolon. No free intraperitoneal air is evident on supine imaging. Osseous structures intact. Lung bases clear. IMPRESSION: 1. Gaseous distension of large bowel throughout the abdomen, progressed from previous study. Findings are favored to be secondary to ileus. If there is concern for obstruction, a  CT would provide further characterization. 2. Large volume of stool within the right hemicolon. Electronically Signed   By: Duanne Guess D.O.   On: 04/17/2020 14:06   Medications:  Scheduled Meds: . bisacodyl  10 mg Rectal BID  . chlorhexidine  15 mL Mouth Rinse BID  . enoxaparin (LOVENOX) injection  40 mg  Subcutaneous Q24H  . felbamate  900 mg Oral Q12H  . linaclotide  290 mcg Oral QAC breakfast  . mouth rinse  15 mL Mouth Rinse q12n4p  . pantoprazole  40 mg Oral BID  . sodium phosphate  1 enema Rectal Once   Continuous Infusions: PRN Meds:.acetaminophen **OR** acetaminophen, haloperidol lactate, magnesium citrate, ondansetron **OR** ondansetron (ZOFRAN) IV   Assessment: Principal Problem:   Constipation Active Problems:   Generalized convulsive epilepsy (HCC)   Autism spectrum disorder with accompanying language impairment and intellectual disability, requiring very substantial support   Decreased oral intake   Dysphagia   Acute esophagitis    Plan: No obstructive lesion seen on EGD done yesterday Esophagitis is likely causing his symptoms Patient is on PPI twice daily Keep head of the eyelid elevated to 30 degrees  Patient is on Linzess and has been having bowel movements  GI service will sign off at this time, please page GI on call with any concerns or questions  Follow up in GI clinic in 6-8 weeks   LOS: 3 days   [name], MD 04/18/2020, 7:53 AM

## 2020-04-18 NOTE — Progress Notes (Signed)
  Speech Language Pathology Treatment: Dysphagia  Patient Details Name: Jonathan Kirby MRN: [id number] DOB: [birth year] Today's Date: 04/18/2020 Time: 1517-6160 SLP Time Calculation (min) (ACUTE ONLY): 39 min  Assessment / Plan / Recommendation Clinical Impression  Skilled treatment session provided skilled observation of pt consuming puree, dysphagia 2 as well as thin liquids via straw. Pt demonstrated multiple swallow per sip but didn't have any overt s/s of aspiration. His parents state that his blackflow after swallow is intermittent. During this treatment session, pt consumed ~ 10 oz thin liquids, half of a Magic Cup and 1 graham cracker. Backflow was not observed. Pt appeared to enjoy the [employer], therefore recommend crushing his medicine and delivering it in a [employer]. At this time, pt's oropharyngeal abilities appear grossly intact. ST intervention is no longer indicated.    HPI HPI: Jonathan Kirby is a 34 y.o. male with autism, nonverbal, history obtained from chart, parents at bedside, with GI consulted for constipation and dysphagia. Parents state, since March of this year patient has been having constipation and was thought to have acid reflux by doctors making house calls as mother reported increased elevation.  Protonix was started which helped, but parents feel like he may have had some trouble swallowing, but given that he is unable to provide history, symptoms are unclear.       SLP Plan  All goals met       Recommendations  Diet recommendations: Regular;Thin liquid Liquids provided via: Straw Medication Administration: Crushed with puree Supervision: Trained caregiver to feed patient Compensations: Minimize environmental distractions;Slow rate;Small sips/bites Postural Changes and/or Swallow Maneuvers: Seated upright 90 degrees;Upright 30-60 min after meal                Oral Care Recommendations: Oral care BID Follow up Recommendations: None SLP Visit Diagnosis:  Dysphagia, unspecified (R13.10) Plan: All goals met       GO              Dajanay Northrup B. Rutherford Nail M.S., CCC-SLP, [place] Office [phone]   [name] 04/18/2020, 12:29 PM

## 2020-04-19 DIAGNOSIS — F840 Autistic disorder: Secondary | ICD-10-CM | POA: Diagnosis not present

## 2020-04-19 DIAGNOSIS — K209 Esophagitis, unspecified without bleeding: Secondary | ICD-10-CM | POA: Diagnosis not present

## 2020-04-19 DIAGNOSIS — K5900 Constipation, unspecified: Secondary | ICD-10-CM | POA: Diagnosis not present

## 2020-04-19 LAB — CBC
HCT: 30.4 % — ABNORMAL LOW (ref 39.0–52.0)
Hemoglobin: 10.4 g/dL — ABNORMAL LOW (ref 13.0–17.0)
MCH: 30 pg (ref 26.0–34.0)
MCHC: 34.2 g/dL (ref 30.0–36.0)
MCV: 87.6 fL (ref 80.0–100.0)
Platelets: 290 10*3/uL (ref 150–400)
RBC: 3.47 MIL/uL — ABNORMAL LOW (ref 4.22–5.81)
RDW: 14.2 % (ref 11.5–15.5)
WBC: 4.3 10*3/uL (ref 4.0–10.5)
nRBC: 0 % (ref 0.0–0.2)

## 2020-04-19 LAB — BASIC METABOLIC PANEL
Anion gap: 7 (ref 5–15)
BUN: 17 mg/dL (ref 6–20)
CO2: 25 mmol/L (ref 22–32)
Calcium: 8 mg/dL — ABNORMAL LOW (ref 8.9–10.3)
Chloride: 108 mmol/L (ref 98–111)
Creatinine, Ser: 0.66 mg/dL (ref 0.61–1.24)
GFR calc Af Amer: >60 mL/min (ref >60)
GFR calc non Af Amer: >60 mL/min (ref >60)
Glucose, Bld: 104 mg/dL — ABNORMAL HIGH (ref 70–99)
Potassium: 3.5 mmol/L (ref 3.5–5.1)
Sodium: 140 mmol/L (ref 135–145)

## 2020-04-19 MED ORDER — PANTOPRAZOLE SODIUM 40 MG PO TBEC: 40.0000 mg | DELAYED_RELEASE_TABLET | Freq: Two times a day (BID) | ORAL | 0 refills | Status: DC

## 2020-04-19 MED ORDER — LINACLOTIDE 290 MCG PO CAPS: 290.0000 ug | ORAL_CAPSULE | Freq: Every day | ORAL | 0 refills | Status: DC

## 2020-04-19 MED ORDER — BISACODYL 10 MG RE SUPP: 10.0000 mg | Freq: Two times a day (BID) | RECTAL | 0 refills | Status: DC | PRN

## 2020-04-19 NOTE — Discharge Summary (Signed)
Physician Discharge Summary  Jonathan Kirby ZOX:[id number] DOB: 12-10-1985 DOA: 04/14/2020  PCP: Almetta LovelyHousecalls, [facility]  Admit date: 04/14/2020 Discharge date: 04/19/2020  Admitted From: home Disposition: home  Recommendations for Outpatient Follow-up:  1. Follow up with PCP in 1 week 2. F/u w/ GI, Dr. Maximino Greenlandahiliani, in 6-8 weeks  Home Health: no  Equipment/Devices:  Discharge Condition: stable CODE STATUS: full  Diet recommendation: as tolerated   Brief/Interim Summary: HPI was taken from Dr. Para Marchuncan: Jonathan AngelKevin L Mestas is a 34 y.o. male with medical history significant for severe autism, nonverbal at baseline, who was brought in by his parents with 2-week history of constipation not responding to usual laxatives, associated with decreased oral intake and gurgling sounds when eating,  No apparent nausea, and no vomiting but appears to have abdominal discomfort at times.  No fevers.  History limited due to patient's autism and is taken from patient's parents at the bedside. ED Course: On arrival, vitals unremarkable.  Blood work unremarkable.  Chest x-ray benign.  Abdominal x-ray with no evidence of bowel dilatation or obstruction and no free air.  Large amount of stool throughout the colon.  Patient received a large volume enema in the emergency room and drank a bottle of magnesium citrate without a bowel movement.  Hospitalization requested to arrange for possible inpatient GI consultation, barium swallow.  Hospital Course from Dr. Wilfred LacyJ. Laurina Fischl 7/28-7/31/21: Pt presented w/ acute on chronic constipation. Pt was only able to have liquid stools while inpatient despite giving multiple laxatives, stool softners. Pt would often refuse taking these medications by mouth. On day of d/c pt was eating and drinking more.  Pt was started on linzess by GI. CT abd/pelvis only showed distended colon by gas & stool. Of note, pt did have an EGD for dysphagia which showed grade C reflux esophagitis, mucosal nodule  found in esophagus, gastric polyps,  all which are neg for dysplasia & malignancy. Pt was started on pantoprazole 40mg  BID x 4 weeks as per GI. Pt will f/u w/ GI in 6-8 weeks. For more information, please see previous progress notes  Discharge Diagnoses:  Principal Problem:   Constipation Active Problems:   Generalized convulsive epilepsy (HCC)   Autism spectrum disorder with accompanying language impairment and intellectual disability, requiring very substantial support   Decreased oral intake   Dysphagia   Acute esophagitis  Acute on chronic constipation: still w/ significant amount of stool in colon. CT abd/pelvis shows colon diffusely distended by gas & stool.  Continue on linzess, dulcolax. Repeat XR abd shows likely ileus   Dysphagia:  S/p EGD 04/17/20 showed grade C reflux esophagitis, mucosal nodule found in esophagus, gastric polyps, all are neg for dysplasia & malignancy. Continue on protonix 40mg  BID x 4 weeks as per GI. F/u GI w/ 6-8 weeks  Hypokalemia: WNL today. Will continue to monitor   Seizure disorder: unknown type or severity. Continue on felbatol   Autism: nonverbal. Continue w/ supportive care   Thrombocytosis: resolved   Discharge Instructions  Discharge Instructions    Diet general   Complete by: As directed    Discharge instructions   Complete by: As directed    F/u PCP in 1-2 weeks. F/u w/ GI in 6-8 weeks.   Increase activity slowly   Complete by: As directed      Allergies as of 04/19/2020   No Known Allergies     Medication List    TAKE these medications   bisacodyl 10 MG suppository Commonly known  as: DULCOLAX Place 1 suppository (10 mg total) rectally 2 (two) times daily as needed for moderate constipation or severe constipation.   felbamate 600 MG/5ML suspension Commonly known as: FELBATOL TAKE 7.5 MLS BY MOUTH TWICE DAILY   haloperidol 5 MG tablet Commonly known as: HALDOL TAKE 1/2 TABLET BY MOUTH AS NEEDED AGITATION What changed:    how much to take  how to take this  when to take this  reasons to take this  additional instructions   linaclotide 290 MCG Caps capsule Commonly known as: LINZESS Take 1 capsule (290 mcg total) by mouth daily before breakfast. Start taking on: April 20, 2020   pantoprazole 40 MG tablet Commonly known as: PROTONIX Take 1 tablet (40 mg total) by mouth 2 (two) times daily.       No Known Allergies  Consultations:  GI   Procedures/Studies: CT ABDOMEN PELVIS WO CONTRAST  Result Date: 04/18/2020 CLINICAL DATA:  Acute on chronic constipation with bowel obstruction suspected. EXAM: CT ABDOMEN AND PELVIS WITHOUT CONTRAST TECHNIQUE: Multidetector CT imaging of the abdomen and pelvis was performed following the standard protocol without IV contrast. COMPARISON:  KUB from yesterday FINDINGS: Lower chest:  Borderline lower esophageal thickening. Hepatobiliary: No focal liver abnormality.No evidence of biliary obstruction or stone. Pancreas: Unremarkable. Spleen: Unremarkable. Adrenals/Urinary Tract: Negative adrenals. No hydronephrosis or stone. Unremarkable bladder. Stomach/Bowel: The colon is diffusely distended by stool and gas. There is redundancy of the colon with some loose twisting of the sigmoid mesentery but no true volvulus or other transition point. Intermittent gas within nondilated small bowel. No detected edema. Vascular/Lymphatic: No acute vascular abnormality. No mass or adenopathy. Reproductive:No pathologic findings. Other: No ascites or pneumoperitoneum. Musculoskeletal: Negative IMPRESSION: Redundant and tortuous colon that is diffusely distended by gas and stool. Electronically Signed   By: Marnee Spring M.D.   On: 04/18/2020 11:38   DG Chest Portable 1 View  Result Date: 04/14/2020 CLINICAL DATA:  34 year old male with constipation. EXAM: PORTABLE CHEST 1 VIEW COMPARISON:  Chest radiograph dated 02/10/2007. FINDINGS: There is no focal consolidation, pleural  effusion, or pneumothorax. The cardiac silhouette is within limits. No acute osseous pathology. IMPRESSION: No active disease. Electronically Signed   By: Elgie Collard M.D.   On: 04/14/2020 16:00   DG Abd Portable 1V  Result Date: 04/17/2020 CLINICAL DATA:  Abdominal distension EXAM: PORTABLE ABDOMEN - 1 VIEW COMPARISON:  04/14/2020 FINDINGS: There is gaseous distension of large bowel throughout the abdomen, progressed from previous study. Large volume of stool within the right hemicolon. No free intraperitoneal air is evident on supine imaging. Osseous structures intact. Lung bases clear. IMPRESSION: 1. Gaseous distension of large bowel throughout the abdomen, progressed from previous study. Findings are favored to be secondary to ileus. If there is concern for obstruction, a CT would provide further characterization. 2. Large volume of stool within the right hemicolon. Electronically Signed   By: Duanne Guess D.O.   On: 04/17/2020 14:06   DG Abd Portable 1V  Result Date: 04/14/2020 CLINICAL DATA:  34 year old male with constipation. EXAM: PORTABLE ABDOMEN - 1 VIEW COMPARISON:  None. FINDINGS: There is large amount of stool throughout the colon. No bowel dilatation or evidence of obstruction. No free air or radiopaque calculi. The osseous structures and soft tissues are grossly unremarkable. IMPRESSION: Constipation. No bowel obstruction. Electronically Signed   By: Elgie Collard M.D.   On: 04/14/2020 15:59       Subjective: Pt is nonverbal secondary to autism.   Discharge Exam:  Vitals:   04/18/20 2357 04/19/20 0741  BP: (!) 113/57 (!) 122/87  Pulse: 81 64  Resp:    Temp: 98.2 F (36.8 C) 98.1 F (36.7 C)  SpO2: 100% 100%   Vitals:   04/18/20 0730 04/18/20 1518 04/18/20 2357 04/19/20 0741  BP: (!) 100/59 122/68 (!) 113/57 (!) 122/87  Pulse: 71 72 81 64  Resp: 18 18    Temp: 97.7 F (36.5 C) 98.1 F (36.7 C) 98.2 F (36.8 C) 98.1 F (36.7 C)  TempSrc: Axillary  Axillary Axillary Oral  SpO2: 99% 98% 100% 100%  Weight:      Height:        General: Pt is alert, awake, not in acute distress Cardiovascular: S1/S2 +, no rubs, no gallops Respiratory: CTA bilaterally, no wheezing, no rhonchi Abdominal: Soft, NT,  hyperactive bowel sounds  Extremities: no edema, no cyanosis    The results of significant diagnostics from this hospitalization (including imaging, microbiology, ancillary and laboratory) are listed below for reference.     Microbiology: Recent Results (from the past 240 hour(s))  SARS Coronavirus 2 by RT PCR (hospital order, performed in [facility] hospital lab) Nasopharyngeal Nasopharyngeal Swab     Status: None   Collection Time: 04/14/20  7:01 PM   Specimen: Nasopharyngeal Swab  Result Value Ref Range Status   SARS Coronavirus 2 NEGATIVE NEGATIVE Final    Comment: (NOTE) SARS-CoV-2 target nucleic acids are NOT DETECTED.  The SARS-CoV-2 RNA is generally detectable in upper and lower respiratory specimens during the acute phase of infection. The lowest concentration of SARS-CoV-2 viral copies this assay can detect is 250 copies / mL. A negative result does not preclude SARS-CoV-2 infection and should not be used as the sole basis for treatment or other patient management decisions.  A negative result may occur with improper specimen collection / handling, submission of specimen other than nasopharyngeal swab, presence of viral mutation(s) within the areas targeted by this assay, and inadequate number of viral copies (<250 copies / mL). A negative result must be combined with clinical observations, patient history, and epidemiological information.  Fact Sheet for Patients:   BoilerBrush.com.cy  Fact Sheet for Healthcare Providers: https://pope.com/  This test is not yet approved or  cleared by the [place] FDA and has been authorized for detection and/or diagnosis of  SARS-CoV-2 by FDA under an Emergency Use Authorization (EUA).  This EUA will remain in effect (meaning this test can be used) for the duration of the COVID-19 declaration under Section 564(b)(1) of the Act, 21 U.S.C. section 360bbb-3(b)(1), unless the authorization is terminated or revoked sooner.  Performed at [facility], [address] [place], [place] [address]      Labs: BNP (last 3 results) No results for input(s): BNP in the last 8760 hours. Basic Metabolic Panel: Recent Labs  Lab 04/14/20 1453 04/17/20 0423 04/18/20 0411 04/19/20 0412  NA 140 141 142 140  K 3.8 3.2* 4.0 3.5  CL 100 106 108 108  CO2 25 26 25 25   GLUCOSE 141* 103* 93 104*  BUN 32* 18 17 17   CREATININE 1.02 0.68 0.77 0.66  CALCIUM 9.0 8.2* 8.5* 8.0*  MG  --   --  1.8  --    Liver Function Tests: Recent Labs  Lab 04/14/20 1453  AST 41  ALT 46*  ALKPHOS 91  BILITOT 1.1  PROT 9.0*  ALBUMIN 4.3   Recent Labs  Lab 04/14/20 1453  LIPASE 22   No results  for input(s): AMMONIA in the last 168 hours. CBC: Recent Labs  Lab 04/14/20 1453 04/17/20 0423 04/18/20 0411 04/19/20 0412  WBC 5.0 6.4 7.1 4.3  HGB 13.7 12.0* 11.3* 10.4*  HCT 42.5 34.5* 33.6* 30.4*  MCV 90.8 86.5 86.4 87.6  PLT 427* 325 311 290   Cardiac Enzymes: No results for input(s): CKTOTAL, CKMB, CKMBINDEX, TROPONINI in the last 168 hours. BNP: Invalid input(s): POCBNP CBG: No results for input(s): GLUCAP in the last 168 hours. D-Dimer No results for input(s): DDIMER in the last 72 hours. Hgb A1c No results for input(s): HGBA1C in the last 72 hours. Lipid Profile No results for input(s): CHOL, HDL, LDLCALC, TRIG, CHOLHDL, LDLDIRECT in the last 72 hours. Thyroid function studies No results for input(s): TSH, T4TOTAL, T3FREE, THYROIDAB in the last 72 hours.  Invalid input(s): FREET3 Anemia work up No results for input(s): VITAMINB12, FOLATE, FERRITIN, TIBC, IRON, RETICCTPCT in the last 72  hours. Urinalysis    Component Value Date/Time   COLORURINE AMBER (A) 04/14/2020 1439   APPEARANCEUR HAZY (A) 04/14/2020 1439   LABSPEC 1.043 (H) 04/14/2020 1439   PHURINE 5.0 04/14/2020 1439   GLUCOSEU NEGATIVE 04/14/2020 1439   HGBUR NEGATIVE 04/14/2020 1439   BILIRUBINUR MODERATE (A) 04/14/2020 1439   KETONESUR 20 (A) 04/14/2020 1439   PROTEINUR 100 (A) 04/14/2020 1439   NITRITE NEGATIVE 04/14/2020 1439   LEUKOCYTESUR NEGATIVE 04/14/2020 1439   Sepsis Labs Invalid input(s): PROCALCITONIN,  WBC,  LACTICIDVEN Microbiology Recent Results (from the past 240 hour(s))  SARS Coronavirus 2 by RT PCR (hospital order, performed in [facility] hospital lab) Nasopharyngeal Nasopharyngeal Swab     Status: None   Collection Time: 04/14/20  7:01 PM   Specimen: Nasopharyngeal Swab  Result Value Ref Range Status   SARS Coronavirus 2 NEGATIVE NEGATIVE Final    Comment: (NOTE) SARS-CoV-2 target nucleic acids are NOT DETECTED.  The SARS-CoV-2 RNA is generally detectable in upper and lower respiratory specimens during the acute phase of infection. The lowest concentration of SARS-CoV-2 viral copies this assay can detect is 250 copies / mL. A negative result does not preclude SARS-CoV-2 infection and should not be used as the sole basis for treatment or other patient management decisions.  A negative result may occur with improper specimen collection / handling, submission of specimen other than nasopharyngeal swab, presence of viral mutation(s) within the areas targeted by this assay, and inadequate number of viral copies (<250 copies / mL). A negative result must be combined with clinical observations, patient history, and epidemiological information.  Fact Sheet for Patients:   BoilerBrush.com.cy  Fact Sheet for Healthcare Providers: https://pope.com/  This test is not yet approved or  cleared by the [place] FDA and has been  authorized for detection and/or diagnosis of SARS-CoV-2 by FDA under an Emergency Use Authorization (EUA).  This EUA will remain in effect (meaning this test can be used) for the duration of the COVID-19 declaration under Section 564(b)(1) of the Act, 21 U.S.C. section 360bbb-3(b)(1), unless the authorization is terminated or revoked sooner.  Performed at [facility], [address], [place], [place] [address]      Time coordinating discharge: Over 30 minutes  SIGNED:   Charise Killian, MD  [facility] 04/19/2020, 2:08 PM Pager   If 7PM-7AM, please contact night-coverage [email or web address]

## 2020-04-19 NOTE — Progress Notes (Signed)
Pt was discharged at 1400. Discharge instructions were reviewed with patient's parents. PIV was removed, while parents restrained patient. Pt is very strong and can be combative. Pt sat on the floor while ambulating to the wheelchair.Parents were reluctant for patient to take Haldol as offered. Parents and nurse were able to retrieved patient from his sitting position and place in the wheelchair.Pt was transported off the unit via wheelchair, accompanied with parents.

## 2020-04-20 NOTE — Anesthesia Postprocedure Evaluation (Signed)
Anesthesia Post Note  Patient: Jonathan Kirby  Procedure(s) Performed: ESOPHAGOGASTRODUODENOSCOPY (EGD) (N/A )  Patient location during evaluation: Endoscopy Anesthesia Type: General Level of consciousness: awake and alert Pain management: pain level controlled Vital Signs Assessment: post-procedure vital signs reviewed and stable Respiratory status: spontaneous breathing, nonlabored ventilation, respiratory function stable and patient connected to nasal cannula oxygen Cardiovascular status: blood pressure returned to baseline and stable Postop Assessment: no apparent nausea or vomiting Anesthetic complications: no   No complications documented.   Last Vitals:  Vitals:   04/18/20 2357 04/19/20 0741  BP: (!) 113/57 (!) 122/87  Pulse: 81 64  Resp:    Temp: 36.8 C 36.7 C  SpO2: 100% 100%    Last Pain:  Vitals:   04/19/20 1100  TempSrc:   PainSc: 0-No pain                 Lenard Simmer

## 2020-04-21 ENCOUNTER — Telehealth: Payer: Self-pay

## 2020-04-21 NOTE — Telephone Encounter (Signed)
Patient mom called and scheduled patient a hospital follow up. She scheduled this for 06/23/2020 in [place]. Patient mom states patient is Autistic and she will have to sedate him to get him in the office. She wants to know if she can do a virtual visit for him on 06/23/2020

## 2020-04-21 NOTE — Telephone Encounter (Signed)
Changed visit to a mychart visit. Called and left a detail message informing mom

## 2020-04-23 ENCOUNTER — Telehealth: Payer: Self-pay | Admitting: Gastroenterology

## 2020-04-23 NOTE — Telephone Encounter (Signed)
LVM for patient to call our office to schedule virtual appt for next wk per Dr. Michele Mcalpine request.

## 2020-04-24 NOTE — Telephone Encounter (Signed)
Patient is currently on Linzess 290

## 2020-04-30 ENCOUNTER — Encounter: Payer: Self-pay | Admitting: Gastroenterology

## 2020-04-30 ENCOUNTER — Telehealth (INDEPENDENT_AMBULATORY_CARE_PROVIDER_SITE_OTHER): Payer: Medicaid Other | Admitting: Gastroenterology

## 2020-04-30 DIAGNOSIS — K5900 Constipation, unspecified: Secondary | ICD-10-CM

## 2020-04-30 DIAGNOSIS — K209 Esophagitis, unspecified without bleeding: Secondary | ICD-10-CM | POA: Diagnosis not present

## 2020-04-30 MED ORDER — PANTOPRAZOLE SODIUM 40 MG PO TBEC: 40.0000 mg | DELAYED_RELEASE_TABLET | Freq: Two times a day (BID) | ORAL | 1 refills | Status: DC

## 2020-04-30 MED ORDER — LINACLOTIDE 290 MCG PO CAPS: 290.0000 ug | ORAL_CAPSULE | Freq: Every day | ORAL | 5 refills | Status: DC

## 2020-05-01 ENCOUNTER — Encounter (INDEPENDENT_AMBULATORY_CARE_PROVIDER_SITE_OTHER): Payer: Self-pay

## 2020-05-01 NOTE — Progress Notes (Signed)
Jonathan Bouillon, MD [address]  [address]  [place], [place] [address]  Main: [phone]  Fax: [phone]   Primary Care Physician: [facility]  Virtual Visit via Telephone Note  I connected with patient on 05/01/20 at  3:00 PM EDT by telephone and verified that I am speaking with the correct person using two identifiers.   I discussed the limitations, risks, security and privacy concerns of performing an evaluation and management service by telephone and the availability of in person appointments. I also discussed with the patient that there may be a patient responsible charge related to this service. The patient expressed understanding and agreed to proceed.  Location of Patient: Home Location of Provider: Home Persons involved: Patient, his mother, provider only during the visit (nursing staff and front desk staff was involved in communicating with the patient prior to the appointment, reviewing medications and checking them in)   History of Present Illness: Chief complaint: Constipation  HPI: Jonathan Kirby is a 34 y.o. male with autism and parents being caregivers and providing history, recently seen as an inpatient for constipation.  Linzess was started and patient is responding well to it.  Mother states she has noticed significant difference in his bowel movements.  However, having loose bowel movements but better than where he was where he was not having any bowel movements despite days of MiraLAX.  She still having to use suppositories intermittently.  She states patient does not do well with enemas, and she would like to use other suppositories that have worked well for him in the past.  No blood in stool.  He was also evaluated as an inpatient for dysphagia with EGD not showing any obstructive lesions.  Biopsies not showing EOE.  Speech and swallow also saw the patient and made diet recommendations.  Mother states patient eats solids well, but has  been refusing liquids  Current Outpatient Medications  Medication Sig Dispense Refill   bisacodyl (DULCOLAX) 10 MG suppository Place 1 suppository (10 mg total) rectally 2 (two) times daily as needed for moderate constipation or severe constipation.  0   felbamate (FELBATOL) 600 MG/5ML suspension TAKE 7.5 MLS BY MOUTH TWICE DAILY 474 mL 5   haloperidol (HALDOL) 5 MG tablet TAKE 1/2 TABLET BY MOUTH AS NEEDED AGITATION (Patient taking differently: Take 2.5 mg by mouth as needed for agitation. ) 20 tablet 5   linaclotide (LINZESS) 290 MCG CAPS capsule Take 1 capsule (290 mcg total) by mouth daily before breakfast. 30 capsule 5   pantoprazole (PROTONIX) 40 MG tablet Take 1 tablet (40 mg total) by mouth 2 (two) times daily. 60 tablet 1   No current facility-administered medications for this visit.    Allergies as of 04/30/2020   (No Known Allergies)    Review of Systems:    All systems reviewed and negative except where noted in HPI.   Observations/Objective:  Labs: CMP     Component Value Date/Time   NA 140 04/19/2020 0412   K 3.5 04/19/2020 0412   CL 108 04/19/2020 0412   CO2 25 04/19/2020 0412   GLUCOSE 104 (H) 04/19/2020 0412   BUN 17 04/19/2020 0412   CREATININE 0.66 04/19/2020 0412   CALCIUM 8.0 (L) 04/19/2020 0412   PROT 9.0 (H) 04/14/2020 1453   ALBUMIN 4.3 04/14/2020 1453   AST 41 04/14/2020 1453   ALT 46 (H) 04/14/2020 1453   ALKPHOS 91 04/14/2020 1453   BILITOT 1.1 04/14/2020 1453   GFRNONAA >  60 04/19/2020 0412   GFRAA >60 04/19/2020 0412   Lab Results  Component Value Date   WBC 4.3 04/19/2020   HGB 10.4 (L) 04/19/2020   HCT 30.4 (L) 04/19/2020   MCV 87.6 04/19/2020   PLT 290 04/19/2020    Imaging Studies: CT ABDOMEN PELVIS WO CONTRAST  Result Date: 04/18/2020 CLINICAL DATA:  Acute on chronic constipation with bowel obstruction suspected. EXAM: CT ABDOMEN AND PELVIS WITHOUT CONTRAST TECHNIQUE: Multidetector CT imaging of the abdomen and pelvis was  performed following the standard protocol without IV contrast. COMPARISON:  KUB from yesterday FINDINGS: Lower chest:  Borderline lower esophageal thickening. Hepatobiliary: No focal liver abnormality.No evidence of biliary obstruction or stone. Pancreas: Unremarkable. Spleen: Unremarkable. Adrenals/Urinary Tract: Negative adrenals. No hydronephrosis or stone. Unremarkable bladder. Stomach/Bowel: The colon is diffusely distended by stool and gas. There is redundancy of the colon with some loose twisting of the sigmoid mesentery but no true volvulus or other transition point. Intermittent gas within nondilated small bowel. No detected edema. Vascular/Lymphatic: No acute vascular abnormality. No mass or adenopathy. Reproductive:No pathologic findings. Other: No ascites or pneumoperitoneum. Musculoskeletal: Negative IMPRESSION: Redundant and tortuous colon that is diffusely distended by gas and stool. Electronically Signed   By: Marnee Spring M.D.   On: 04/18/2020 11:38   DG Chest Portable 1 View  Result Date: 04/14/2020 CLINICAL DATA:  34 year old male with constipation. EXAM: PORTABLE CHEST 1 VIEW COMPARISON:  Chest radiograph dated 02/10/2007. FINDINGS: There is no focal consolidation, pleural effusion, or pneumothorax. The cardiac silhouette is within limits. No acute osseous pathology. IMPRESSION: No active disease. Electronically Signed   By: Elgie Collard M.D.   On: 04/14/2020 16:00   DG Abd Portable 1V  Result Date: 04/17/2020 CLINICAL DATA:  Abdominal distension EXAM: PORTABLE ABDOMEN - 1 VIEW COMPARISON:  04/14/2020 FINDINGS: There is gaseous distension of large bowel throughout the abdomen, progressed from previous study. Large volume of stool within the right hemicolon. No free intraperitoneal air is evident on supine imaging. Osseous structures intact. Lung bases clear. IMPRESSION: 1. Gaseous distension of large bowel throughout the abdomen, progressed from previous study. Findings are favored  to be secondary to ileus. If there is concern for obstruction, a CT would provide further characterization. 2. Large volume of stool within the right hemicolon. Electronically Signed   By: Duanne Guess D.O.   On: 04/17/2020 14:06   DG Abd Portable 1V  Result Date: 04/14/2020 CLINICAL DATA:  34 year old male with constipation. EXAM: PORTABLE ABDOMEN - 1 VIEW COMPARISON:  None. FINDINGS: There is large amount of stool throughout the colon. No bowel dilatation or evidence of obstruction. No free air or radiopaque calculi. The osseous structures and soft tissues are grossly unremarkable. IMPRESSION: Constipation. No bowel obstruction. Electronically Signed   By: Elgie Collard M.D.   On: 04/14/2020 15:59    Assessment and Plan:   Jonathan Kirby is a 34 y.o. y/o male with autism, with care provided by parents and history mainly provided by mother, here for follow-up of dysphagia and constipation  Assessment and Plan: Constipation responding well to Linzess Use suppositories every 3 days if no bowel movement  Mother was concerned about patient not being interested in drinking liquids.  However, he is not having any issues with solids.  Given his mental status and autism this can be challenging both for patient and parents.  However, since no obstructive lesions were identified and speech and swallow therapy did give their diet recommendations, further work-up or testing may  not lead to much benefit and would pay patient through further distress.  Neck steps for evaluation of dysphagia would be manometry, but patient unlikely to tolerate given his autism.  In addition, he does not seem to be having liquid dysphagia but more so not interested in liquids.  He was found to have esophagitis on his EGD and may be having reflux issues that are leading him to not be interested in liquids as much.  With PPI this may continue to improve.  They have been out of the medication recently  (Risks of PPI use  were discussed with patient including bone loss, C. Diff diarrhea, pneumonia, infections, CKD, electrolyte abnormalities.  Pt. Verbalizes understanding and chooses to continue the medication.)  His hemoglobin did drop as an inpatient as well, repeat at this time to ensure it is improving  Follow Up Instructions:    I discussed the assessment and treatment plan with the patient. The patient was provided an opportunity to ask questions and all were answered. The patient agreed with the plan and demonstrated an understanding of the instructions.   The patient was advised to call back or seek an in-person evaluation if the symptoms worsen or if the condition fails to improve as anticipated.  I provided 15 minutes of non-face-to-face time during this encounter. Additional time was spent in reviewing patient's chart, placing orders etc.   Pasty Spillers, MD  Speech recognition software was used to dictate this note.

## 2020-05-06 ENCOUNTER — Other Ambulatory Visit: Payer: Self-pay | Admitting: Gastroenterology

## 2020-05-06 MED ORDER — TRULANCE 3 MG PO TABS: 3.0000 mg | ORAL_TABLET | Freq: Every day | ORAL | 1 refills | Status: DC

## 2020-05-09 ENCOUNTER — Encounter: Payer: Self-pay | Admitting: Gastroenterology

## 2020-05-09 NOTE — Progress Notes (Signed)
Linzess is not effective in treating his constipation.  We will try to obtain prior authorization for Trulance.  Patient is also severely autistic, and mother is having trouble giving him capsules and pills.  The Trulance can be crushed and mixed into his solid foods which would allow him to tolerate it better and may be more effective than the Linzess.  Whereas, the Linzess cannot be crushed and therefore the patient is not effectively taking it as he is refusing some of his capsules and pills

## 2020-06-05 ENCOUNTER — Encounter: Payer: Self-pay | Admitting: Gastroenterology

## 2020-06-05 ENCOUNTER — Telehealth (INDEPENDENT_AMBULATORY_CARE_PROVIDER_SITE_OTHER): Payer: Medicaid Other | Admitting: Gastroenterology

## 2020-06-05 ENCOUNTER — Telehealth: Payer: Self-pay

## 2020-06-05 DIAGNOSIS — K5900 Constipation, unspecified: Secondary | ICD-10-CM | POA: Diagnosis not present

## 2020-06-05 MED ORDER — SENNOSIDES 8.8 MG/5ML PO SYRP: 15.0000 mL | ORAL_SOLUTION | Freq: Two times a day (BID) | ORAL | 0 refills | Status: DC

## 2020-06-05 NOTE — Telephone Encounter (Signed)
Patient's mother-Jonathan Kirby called stating that her son continues to be constipated since 05/27/2020. Jonathan Kirby stated that he is eating well and drinking well even though it is hard for him to drink lots of water. Jonathan Kirby stated that she continues to give him Trulance and Protonix as instructed but would like a call back from the provider. I will offer her a phone appointment to discuss what to do for her son's constipation.

## 2020-06-06 ENCOUNTER — Encounter: Payer: Self-pay | Admitting: Family Medicine

## 2020-06-06 NOTE — Progress Notes (Signed)
Jonathan Bouillon, MD [address]  [address]  [place], [place] [address]  Main: [phone]  Fax: [phone]   Primary Care Physician: [facility]  Virtual Visit via Telephone Note  I connected with patient on 06/06/20 at  1:15 PM EDT by telephone and verified that I am speaking with the correct person using two identifiers.   I discussed the limitations, risks, security and privacy concerns of performing an evaluation and management service by telephone and the availability of in person appointments. I also discussed with the patient that there may be a patient responsible charge related to this service. The patient expressed understanding and agreed to proceed.  Location of Patient: Home Location of Provider: Home Persons involved: Patient, his mother Jonathan Kirby, and provider only during the visit (nursing staff and front desk staff was involved in communicating with the patient prior to the appointment, reviewing medications and checking them in)   History of Present Illness: Chief Complaint  Patient presents with  . Constipation    Patient continues to be constipated even with his medications.     HPI: Jonathan Kirby is a 34 y.o. male with autism here for follow-up of constipation.  Trulance was working well but patient has now had multiple days without a bowel movement.  Has had 2 use enemas.  The other challenges also that he is not readily taking liquids and so his medications have to be crushable form that mother put in applesauce.  No nausea or vomiting.  Current Outpatient Medications  Medication Sig Dispense Refill  . bisacodyl (DULCOLAX) 10 MG suppository Place 1 suppository (10 mg total) rectally 2 (two) times daily as needed for moderate constipation or severe constipation.  0  . felbamate (FELBATOL) 600 MG/5ML suspension TAKE 7.5 MLS BY MOUTH TWICE DAILY 474 mL 5  . haloperidol (HALDOL) 5 MG tablet TAKE 1/2 TABLET BY MOUTH AS NEEDED AGITATION  (Patient taking differently: Take 2.5 mg by mouth as needed for agitation. ) 20 tablet 5  . pantoprazole (PROTONIX) 40 MG tablet Take 1 tablet (40 mg total) by mouth 2 (two) times daily. 60 tablet 1  . sennosides (SENOKOT) 8.8 MG/5ML syrup Take 15 mLs by mouth in the morning and at bedtime. 900 mL 0   No current facility-administered medications for this visit.    Allergies as of 06/05/2020  . (No Known Allergies)    Review of Systems:    All systems reviewed and negative except where noted in HPI.   Observations/Objective:  Labs: CMP     Component Value Date/Time   NA 140 04/19/2020 0412   K 3.5 04/19/2020 0412   CL 108 04/19/2020 0412   CO2 25 04/19/2020 0412   GLUCOSE 104 (H) 04/19/2020 0412   BUN 17 04/19/2020 0412   CREATININE 0.66 04/19/2020 0412   CALCIUM 8.0 (L) 04/19/2020 0412   PROT 9.0 (H) 04/14/2020 1453   ALBUMIN 4.3 04/14/2020 1453   AST 41 04/14/2020 1453   ALT 46 (H) 04/14/2020 1453   ALKPHOS 91 04/14/2020 1453   BILITOT 1.1 04/14/2020 1453   GFRNONAA >60 04/19/2020 0412   GFRAA >60 04/19/2020 0412   Lab Results  Component Value Date   WBC 4.3 04/19/2020   HGB 10.4 (L) 04/19/2020   HCT 30.4 (L) 04/19/2020   MCV 87.6 04/19/2020   PLT 290 04/19/2020    Imaging Studies: No results found.  Assessment and Plan:   Jonathan Kirby is a 34 y.o. y/o male  with autism here for follow-up of constipation  Assessment and Plan: Start senna syrup to be mixed in applesauce daily Continue Trulance  Linzess was previously prescribed but since cannot be crushed, patient was not taking it as he was refusing liquids  Has follow-up with ENT as patient is not taking liquids well  No strictures identified on his EGD  Follow Up Instructions:    I discussed the assessment and treatment plan with the patient. The patient was provided an opportunity to ask questions and all were answered. The patient agreed with the plan and demonstrated an understanding of the  instructions.   The patient was advised to call back or seek an in-person evaluation if the symptoms worsen or if the condition fails to improve as anticipated.  I provided 15 minutes of non-face-to-face time during this encounter. Additional time was spent in reviewing patient's chart, placing orders etc.   Jonathan Spillers, MD  Speech recognition software was used to dictate this note.

## 2020-06-09 ENCOUNTER — Other Ambulatory Visit: Payer: Self-pay | Admitting: Gastroenterology

## 2020-06-09 NOTE — Telephone Encounter (Signed)
Appointment was on 06/05/2020 with Dr. Maximino Greenland.

## 2020-06-23 ENCOUNTER — Telehealth: Payer: Medicaid Other | Admitting: Gastroenterology

## 2020-07-04 ENCOUNTER — Other Ambulatory Visit: Payer: Self-pay

## 2020-07-04 MED ORDER — SENNA 8.6 MG PO TABS
1.0000 | ORAL_TABLET | Freq: Two times a day (BID) | ORAL | 1 refills | Status: DC
Start: 2020-07-04 — End: 2020-07-08

## 2020-07-04 NOTE — Progress Notes (Signed)
error 

## 2020-07-08 ENCOUNTER — Other Ambulatory Visit: Payer: Self-pay

## 2020-07-08 MED ORDER — SENNA 8.6 MG PO TABS
3.0000 | ORAL_TABLET | Freq: Two times a day (BID) | ORAL | 1 refills | Status: DC
Start: 2020-07-08 — End: 2020-08-21

## 2020-07-17 ENCOUNTER — Telehealth (INDEPENDENT_AMBULATORY_CARE_PROVIDER_SITE_OTHER): Payer: Medicaid Other | Admitting: Gastroenterology

## 2020-07-17 DIAGNOSIS — K208 Other esophagitis without bleeding: Secondary | ICD-10-CM

## 2020-07-17 DIAGNOSIS — K5900 Constipation, unspecified: Secondary | ICD-10-CM | POA: Diagnosis not present

## 2020-07-17 MED ORDER — LINACLOTIDE 290 MCG PO CAPS
290.0000 ug | ORAL_CAPSULE | Freq: Every day | ORAL | 3 refills | Status: DC
Start: 2020-07-17 — End: 2020-09-01

## 2020-07-17 NOTE — Patient Instructions (Signed)
Please discontinue taking Trulance and start taking Linzess.

## 2020-07-18 NOTE — Progress Notes (Signed)
Jonathan Bouillon, MD [address]  [address]  [place], [place] [address]  Main: [phone]  Fax: [phone]   Primary Care Physician: [facility]  Virtual Visit via Video Note  I connected with patient on 07/18/20 at  2:45 PM EDT by video (using [email or web address]) and verified that I am speaking with the correct person using two identifiers.   I discussed the limitations, risks, security and privacy concerns of performing an evaluation and management service by video and the availability of in person appointments. I also discussed with the patient that there may be a patient responsible charge related to this service. The patient expressed understanding and agreed to proceed.  Location of Patient: Home Location of Provider: Home Persons involved: Patient, his mother and provider only (Nursing staff checked in patient via phone but were not physically involved in the video interaction - see their notes)   History of Present Illness: Chief complaint: Constipation  HPI: Jonathan Kirby is a 34 y.o. male with autism and chronic constipation with history provided by his mother.  He continues to struggle with constipation issues and is having to use an enema every 3 days as he is not having bowel movements.  He is on Trulance, senna and continues to have symptoms despite this.  We have tried Linzess before but it did not work well and so it was discontinued per mother's request.  No nausea or vomiting.  He is eating and drinking better and his swallowing issue is getting better P.  He has had a history of grade C esophagitis and every time his Protonix is decreased his oral intake gets worse.  His care is limited by his inability to provide reliable history given his autism  Current Outpatient Medications  Medication Sig Dispense Refill  . bisacodyl (DULCOLAX) 10 MG suppository Place 1 suppository (10 mg total) rectally 2 (two) times daily as needed for moderate constipation or  severe constipation.  0  . felbamate (FELBATOL) 600 MG/5ML suspension TAKE 7.5 MLS BY MOUTH TWICE DAILY 474 mL 5  . haloperidol (HALDOL) 5 MG tablet TAKE 1/2 TABLET BY MOUTH AS NEEDED AGITATION (Patient taking differently: Take 2.5 mg by mouth as needed for agitation. ) 20 tablet 5  . linaclotide (LINZESS) 290 MCG CAPS capsule Take 1 capsule (290 mcg total) by mouth daily. 30 capsule 3  . pantoprazole (PROTONIX) 40 MG tablet Take 1 tablet (40 mg total) by mouth 2 (two) times daily. 60 tablet 1  . senna (SENOKOT) 8.6 MG TABS tablet Take 3 tablets (25.8 mg total) by mouth in the morning and at bedtime. 270 tablet 1   No current facility-administered medications for this visit.    Allergies as of 07/17/2020  . (No Known Allergies)    Review of Systems:    All systems reviewed and negative except where noted in HPI.   Observations/Objective:  Labs: CMP     Component Value Date/Time   NA 140 04/19/2020 0412   K 3.5 04/19/2020 0412   CL 108 04/19/2020 0412   CO2 25 04/19/2020 0412   GLUCOSE 104 (H) 04/19/2020 0412   BUN 17 04/19/2020 0412   CREATININE 0.66 04/19/2020 0412   CALCIUM 8.0 (L) 04/19/2020 0412   PROT 9.0 (H) 04/14/2020 1453   ALBUMIN 4.3 04/14/2020 1453   AST 41 04/14/2020 1453   ALT 46 (H) 04/14/2020 1453   ALKPHOS 91 04/14/2020 1453   BILITOT 1.1 04/14/2020 1453   GFRNONAA >60 04/19/2020 4628  GFRAA >60 04/19/2020 0412   Lab Results  Component Value Date   WBC 4.3 04/19/2020   HGB 10.4 (L) 04/19/2020   HCT 30.4 (L) 04/19/2020   MCV 87.6 04/19/2020   PLT 290 04/19/2020    Imaging Studies: No results found.  Assessment and Plan:   Jonathan Kirby is a 34 y.o. y/o male here for follow-up of constipation and esophagitis  Assessment and Plan: Had an extensive discussion with his mother, who states after looking at their bowel movement diarrhea, the Linzess may have been working and she may have been too quick to ask to discontinue it as she was thinking it  was not working.  She is requesting restart Linzess as she thinks it worked better than the [employer]  Given that patient is unable to provide history reliably, and we have to rely on parents in their noticing of symptoms, this is reasonable  Discontinue Trulance Start Linzess 290 MCG daily  Okay to continue to use senna for the next 2 to 3 days until Linzess starts to work  [employer]  Given that every time she discontinues or decreases his Protonix his oral appetite gets worse, and since he has had grade C esophagitis, it is important to be able to continue his oral intake properly.  Therefore, benefits of continuing Protonix at this time outweigh risks.  Alternative would be to repeat upper endoscopy to reevaluate his esophagitis which likely occurred due to his mostly recumbent status.  We discussed this with the mother, but she does not want to proceed with upper endoscopy at this time given continuing Covid pandemic, and this is reasonable.  (Risks of PPI use were discussed with patient including bone loss, C. Diff diarrhea, pneumonia, infections, CKD, electrolyte abnormalities.  Pt. Verbalizes understanding and chooses to continue the medication.)  As his symptoms get better we may be able to try decreasing the PPI in the near future  Follow Up Instructions:    I discussed the assessment and treatment plan with the patient. The patient was provided an opportunity to ask questions and all were answered. The patient agreed with the plan and demonstrated an understanding of the instructions.   The patient was advised to call back or seek an in-person evaluation if the symptoms worsen or if the condition fails to improve as anticipated.  I provided 20 minutes of face-to-face time via video software during this encounter. Additional time was spent in reviewing patient's chart, placing orders etc.   Pasty Spillers, MD  Speech recognition software was used to dictate  this note.

## 2020-07-25 ENCOUNTER — Telehealth (INDEPENDENT_AMBULATORY_CARE_PROVIDER_SITE_OTHER): Payer: Medicaid Other | Admitting: Pediatrics

## 2020-07-25 ENCOUNTER — Encounter (INDEPENDENT_AMBULATORY_CARE_PROVIDER_SITE_OTHER): Payer: Self-pay | Admitting: Pediatrics

## 2020-07-25 DIAGNOSIS — G40309 Generalized idiopathic epilepsy and epileptic syndromes, not intractable, without status epilepticus: Secondary | ICD-10-CM | POA: Diagnosis not present

## 2020-07-25 DIAGNOSIS — F840 Autistic disorder: Secondary | ICD-10-CM

## 2020-07-25 DIAGNOSIS — G4700 Insomnia, unspecified: Secondary | ICD-10-CM

## 2020-07-25 DIAGNOSIS — G40209 Localization-related (focal) (partial) symptomatic epilepsy and epileptic syndromes with complex partial seizures, not intractable, without status epilepticus: Secondary | ICD-10-CM

## 2020-07-25 DIAGNOSIS — G478 Other sleep disorders: Secondary | ICD-10-CM | POA: Diagnosis not present

## 2020-07-25 MED ORDER — FELBAMATE 600 MG/5ML PO SUSP: ORAL | 5 refills | Status: DC

## 2020-07-25 MED ORDER — HALOPERIDOL 2 MG PO TABS: ORAL_TABLET | ORAL | 5 refills | Status: DC

## 2020-07-25 NOTE — Progress Notes (Signed)
This is a Pediatric Specialist E-Visit follow up consult provided via Caregility Jonathan Kirby and their parent/guardian Jonathan Kirby consented to an E-Visit consult today.  Location of patient: Jonathan Kirby is at home Location of provider: Jack Kirby is working from home Patient was referred by Jonathan Kirby, Jonathan Mak*   The following participants were involved in this E-Visit: patient, mother, CMA, provider  Chief Complaint/ Reason for E-Visit today: Epilepsy, autism Total time on call: 30 minutes Follow up: 6 months    Patient: Jonathan Kirby MRN: [id number] Sex: male DOB: December 06, 1985  Provider: Ellison Carwin, MD Location of Care: [facility]  Note type: Routine return visit  History of Present Illness: Referral Source: Jonathan Salts, MD History from: mother, patient and [facility] chart Chief Complaint: Seizures/Autism  Jonathan Kirby is a 34 y.o. male who was evaluated July 25, 2020 for the first time since July 27, 2019.  He has autism spectrum disorder with intellectual disability and severe mixed language disorder.  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  Felbatol has completely controlled his seizures.  Haloperidol has been used in the past to control his agitation but lately has not worked as well.  Jonathan Kirby has experienced significant weight loss and was diagnosed with esophagitis in March 2021.  He was hospitalized July 26-31 with failure to thrive and severe constipation.  He was diagnosed with esophagitis based on endoscopy.  He was treated with Linzess which was not very helpful he was switched to Trulance which also was not helpful and is now back on Linzess.  He was switched to Protonix twice daily which seems to be improving.  At one point he was not drinking anything and was eating limited amounts of food.  This has improved.  Over the last couple of  weeks he has gained 3 pounds.  He remains seizure-free.  He is able to fall asleep but he has early morning arousals which is an old problem.  His general health otherwise has been good.  He is an active young man.  He has not contracted Covid nor have his parents.  They are vaccinated.  Because of all of his medical problems they have been reluctant to vaccinate him.  I told them that once he got beyond this that they should do so.  I also wanted to talk to them about transition of care as I will be retiring in June 19, 2021.  We need to get him back to the office in 6 months for an interface visit so that he can meet the doctor who will provide long-term care.  I think that probably will be Dr. Lorenz Kirby.  Review of Systems: A complete review of systems was remarkable for patient is here to be seen for epilepsy. Mom reports that the patient has not had any seizures since his last visit. She reports that she would like to discuss changing the Halodol back to the original dose. She states that the patient has lost alot of weight. She also states that the patient was in the hospital back in the summertime., all other systems reviewed and negative.  Past Medical History Diagnosis Date  . Autism   . GERD (gastroesophageal reflux disease)   . Seizures (HCC)    Hospitalizations: Yes.  , Head Injury: No., Nervous System Infections: No., Immunizations up to date: Yes.    Copied from prior chart notes. He had a mixture of  complex partial seizures with secondary generalization, and myoclonic seizures. These were quite frequent, and caused significant impairment in his function. Once we were able to bring seizures under control Felbatol, he showed a great deal of anxiety and depression. This has improved as he has become older. For the most part his seizures have been well controlled on Felbatol. Most recurrent seizures have occurred in the setting of forgotten doses of medication. He  also has problems with constipation.  Behavior History autism spectrum disorder, level 3; he becomes aggressive when he is anxious and with changes in routine.  Surgical History Procedure Laterality Date  . CIRCUMCISION  1987  . ESOPHAGOGASTRODUODENOSCOPY N/A 04/17/2020   Procedure: ESOPHAGOGASTRODUODENOSCOPY (EGD);  Surgeon: Jonathan Spillers, MD;  Location: [facility] ENDOSCOPY;  Service: Endoscopy;  Laterality: N/A;   Family History family history includes Alzheimer's disease in his maternal grandfather; Cancer in his maternal grandmother; Stroke in his paternal grandfather.   Family history is negative for migraines, seizures, intellectual disabilities, blindness, deafness, birth defects, chromosomal disorder, or autism.  Social History Socioeconomic History  . Marital status: Single  . Years of education:  97  . Highest education level:  High school certificate  Occupational History  . Not employed  Tobacco Use  . Smoking status: Never Smoker  . Smokeless tobacco: Never Used  Substance and Sexual Activity  . Alcohol use: No    Alcohol/week: 0.0 standard drinks  . Drug use: No  . Sexual activity: Never  Social History Narrative    Jonathan Kirby does not attend any school or day program at this time.     He lives with his parents.     He enjoys listening to music, playing with remote control cars, and collecting newspapers.   No Known Allergies  Physical Exam There were no vitals taken for this visit.   Examination was limited because he was unable to cooperate.  General: alert, well developed, well nourished, in no acute distress, black hair, brown eyes, right handed Head: normocephalic, no dysmorphic features Neck: supple, full range of motion Musculoskeletal: no skeletal deformities or apparent scoliosis Skin: no rashes or neurocutaneous lesions  Neurologic Exam  Mental Status: alert; makes no eye contact resists any attempt to direct him; knowledge is very below  normal for age; language is absent, he does not speak nor does he follow commands Cranial Nerves: impassive face, poor eye contact Motor: normal functional strength; unable to test fine motor skills or pronator drift Coordination: unable to test Gait and Station: broad-based, diplegic, lurching gait and station  Assessment 1. Generalized convulsive epilepsy, G40.309. 2. Partial epilepsy with impairment of consciousness, G40.209. 3. Autism spectrum disorder with accompanying language impairment and intellectual disability requiring substantial support, F84.0. 4. Insomnia, unspecified type, G47.00. 5. Sleep arousal disorder, G47.8  Discussion The esophagitis and constipation with weight loss is concerning.  I am very pleased that he is not having seizures.  Even though he has lost weight and felbamate was near top therapeutic I am reluctant to drop the dose because we do not want seizures to restart and is not showing side effects from the medication.  Neurologically I think that he is stable.  I recommended taking Haldol and crushing it and putting it in a pure.  Family is able to get Felbatol into him.  At one point they had to soak it into bread and let him eat the bread.  Plan I will refill prescriptions for felbamate, and haloperidol.  Dozier will will return in 6  months for a face-to-face visit so that he can meet Dr. Lorenz Kirby.   Medication List   Accurate as of July 25, 2020 11:45 AM. If you have any questions, ask your nurse or doctor.      TAKE these medications   felbamate 600 MG/5ML suspension Commonly known as: FELBATOL Take 7.5 mL by mouth twice daily What changed: See the new instructions. Changed by: Jonathan Carwin, MD   haloperidol 2 MG tablet Commonly known as: HALDOL Crush 1 tablet in place in a puree What changed:   medication strength  additional instructions Changed by: Jonathan Carwin, MD   linaclotide 290 MCG Caps capsule Commonly known as:  Linzess Take 1 capsule (290 mcg total) by mouth daily.   pantoprazole 40 MG tablet Commonly known as: PROTONIX Take 1 tablet (40 mg total) by mouth 2 (two) times daily.   senna 8.6 MG Tabs tablet Commonly known as: SENOKOT Take 3 tablets (25.8 mg total) by mouth in the morning and at bedtime.   sodium phosphate 7-19 GM/118ML Enem    The medication list was reviewed and reconciled. All changes or newly prescribed medications were explained.  A complete medication list was provided to the patient/caregiver.  Deetta Perla MD

## 2020-07-25 NOTE — Patient Instructions (Addendum)
It was a pleasure to see you today.  Because he has been on felbamate for some time I can send both felbamate and haloperidol to your pharmacy.  I did that today.  I would like to see him in 6 months time in the office so that he can meet Dr. Lorenz Coaster who will be his provider after I retire.  Please let me know if I can help you at all between now and then.

## 2020-08-21 ENCOUNTER — Inpatient Hospital Stay
Admission: EM | Admit: 2020-08-21 | Discharge: 2020-09-01 | DRG: 391 | Disposition: A | Discharge status: Home Health | Payer: Medicaid Other | Attending: Internal Medicine | Admitting: Internal Medicine

## 2020-08-21 ENCOUNTER — Other Ambulatory Visit: Payer: Self-pay

## 2020-08-21 ENCOUNTER — Emergency Department: Payer: Medicaid Other

## 2020-08-21 ENCOUNTER — Telehealth: Payer: Self-pay

## 2020-08-21 DIAGNOSIS — K5641 Fecal impaction: Secondary | ICD-10-CM | POA: Diagnosis present

## 2020-08-21 DIAGNOSIS — R638 Other symptoms and signs concerning food and fluid intake: Secondary | ICD-10-CM | POA: Diagnosis present

## 2020-08-21 DIAGNOSIS — K2970 Gastritis, unspecified, without bleeding: Secondary | ICD-10-CM | POA: Diagnosis present

## 2020-08-21 DIAGNOSIS — E870 Hyperosmolality and hypernatremia: Secondary | ICD-10-CM | POA: Diagnosis present

## 2020-08-21 DIAGNOSIS — R1319 Other dysphagia: Secondary | ICD-10-CM

## 2020-08-21 DIAGNOSIS — Z681 Body mass index (BMI) 19 or less, adult: Secondary | ICD-10-CM

## 2020-08-21 DIAGNOSIS — Z820 Family history of epilepsy and other diseases of the nervous system: Secondary | ICD-10-CM

## 2020-08-21 DIAGNOSIS — Z823 Family history of stroke: Secondary | ICD-10-CM

## 2020-08-21 DIAGNOSIS — Z7401 Bed confinement status: Secondary | ICD-10-CM

## 2020-08-21 DIAGNOSIS — R140 Abdominal distension (gaseous): Secondary | ICD-10-CM

## 2020-08-21 DIAGNOSIS — E871 Hypo-osmolality and hyponatremia: Secondary | ICD-10-CM | POA: Diagnosis not present

## 2020-08-21 DIAGNOSIS — K449 Diaphragmatic hernia without obstruction or gangrene: Secondary | ICD-10-CM | POA: Diagnosis present

## 2020-08-21 DIAGNOSIS — G40409 Other generalized epilepsy and epileptic syndromes, not intractable, without status epilepticus: Secondary | ICD-10-CM | POA: Diagnosis present

## 2020-08-21 DIAGNOSIS — E43 Unspecified severe protein-calorie malnutrition: Secondary | ICD-10-CM

## 2020-08-21 DIAGNOSIS — K5900 Constipation, unspecified: Secondary | ICD-10-CM | POA: Diagnosis not present

## 2020-08-21 DIAGNOSIS — F840 Autistic disorder: Secondary | ICD-10-CM

## 2020-08-21 DIAGNOSIS — K2100 Gastro-esophageal reflux disease with esophagitis, without bleeding: Secondary | ICD-10-CM | POA: Diagnosis present

## 2020-08-21 DIAGNOSIS — Z8719 Personal history of other diseases of the digestive system: Secondary | ICD-10-CM

## 2020-08-21 DIAGNOSIS — F809 Developmental disorder of speech and language, unspecified: Secondary | ICD-10-CM | POA: Diagnosis present

## 2020-08-21 DIAGNOSIS — R109 Unspecified abdominal pain: Secondary | ICD-10-CM | POA: Diagnosis not present

## 2020-08-21 DIAGNOSIS — K5981 Ogilvie syndrome: Principal | ICD-10-CM | POA: Diagnosis present

## 2020-08-21 DIAGNOSIS — K567 Ileus, unspecified: Secondary | ICD-10-CM

## 2020-08-21 DIAGNOSIS — E876 Hypokalemia: Secondary | ICD-10-CM | POA: Diagnosis not present

## 2020-08-21 DIAGNOSIS — J690 Pneumonitis due to inhalation of food and vomit: Secondary | ICD-10-CM

## 2020-08-21 DIAGNOSIS — E440 Moderate protein-calorie malnutrition: Secondary | ICD-10-CM | POA: Insufficient documentation

## 2020-08-21 DIAGNOSIS — K5939 Other megacolon: Secondary | ICD-10-CM | POA: Diagnosis present

## 2020-08-21 DIAGNOSIS — E869 Volume depletion, unspecified: Secondary | ICD-10-CM | POA: Diagnosis present

## 2020-08-21 DIAGNOSIS — F79 Unspecified intellectual disabilities: Secondary | ICD-10-CM | POA: Diagnosis present

## 2020-08-21 DIAGNOSIS — I471 Supraventricular tachycardia: Secondary | ICD-10-CM | POA: Diagnosis present

## 2020-08-21 DIAGNOSIS — G928 Other toxic encephalopathy: Secondary | ICD-10-CM | POA: Diagnosis present

## 2020-08-21 DIAGNOSIS — G9341 Metabolic encephalopathy: Secondary | ICD-10-CM

## 2020-08-21 DIAGNOSIS — N390 Urinary tract infection, site not specified: Secondary | ICD-10-CM | POA: Diagnosis present

## 2020-08-21 DIAGNOSIS — Z79899 Other long term (current) drug therapy: Secondary | ICD-10-CM

## 2020-08-21 DIAGNOSIS — Z20822 Contact with and (suspected) exposure to covid-19: Secondary | ICD-10-CM | POA: Diagnosis present

## 2020-08-21 LAB — COMPREHENSIVE METABOLIC PANEL
ALT: 48 U/L — ABNORMAL HIGH (ref 0–44)
AST: 30 U/L (ref 15–41)
Albumin: 4.1 g/dL (ref 3.5–5.0)
Alkaline Phosphatase: 97 U/L (ref 38–126)
Anion gap: 17 — ABNORMAL HIGH (ref 5–15)
BUN: 20 mg/dL (ref 6–20)
CO2: 27 mmol/L (ref 22–32)
Calcium: 9.1 mg/dL (ref 8.9–10.3)
Chloride: 99 mmol/L (ref 98–111)
Creatinine, Ser: 0.88 mg/dL (ref 0.61–1.24)
GFR, Estimated: >60 mL/min (ref >60)
Glucose, Bld: 136 mg/dL — ABNORMAL HIGH (ref 70–99)
Potassium: 2.8 mmol/L — ABNORMAL LOW (ref 3.5–5.1)
Sodium: 143 mmol/L (ref 135–145)
Total Bilirubin: 1.1 mg/dL (ref 0.3–1.2)
Total Protein: 8.5 g/dL — ABNORMAL HIGH (ref 6.5–8.1)

## 2020-08-21 LAB — CBC
HCT: 41.4 % (ref 39.0–52.0)
Hemoglobin: 13.7 g/dL (ref 13.0–17.0)
MCH: 29.6 pg (ref 26.0–34.0)
MCHC: 33.1 g/dL (ref 30.0–36.0)
MCV: 89.4 fL (ref 80.0–100.0)
Platelets: 312 10*3/uL (ref 150–400)
RBC: 4.63 MIL/uL (ref 4.22–5.81)
RDW: 15.1 % (ref 11.5–15.5)
WBC: 13.5 10*3/uL — ABNORMAL HIGH (ref 4.0–10.5)
nRBC: 0 % (ref 0.0–0.2)

## 2020-08-21 LAB — RESP PANEL BY RT-PCR (FLU A&B, COVID) ARPGX2
Influenza A by PCR: NEGATIVE
Influenza B by PCR: NEGATIVE
SARS Coronavirus 2 by RT PCR: NEGATIVE

## 2020-08-21 LAB — LIPASE, BLOOD: Lipase: 18 U/L (ref 11–51)

## 2020-08-21 LAB — TROPONIN I (HIGH SENSITIVITY): Troponin I (High Sensitivity): 11 ng/L (ref <18)

## 2020-08-21 LAB — LACTIC ACID, PLASMA: Lactic Acid, Venous: 1.8 mmol/L (ref 0.5–1.9)

## 2020-08-21 LAB — MAGNESIUM: Magnesium: 1.7 mg/dL (ref 1.7–2.4)

## 2020-08-21 MED ORDER — KCL IN DEXTROSE-NACL 40-5-0.9 MEQ/L-%-% IV SOLN
INTRAVENOUS | Status: DC
  Filled 2020-08-21 (×4): qty 1000

## 2020-08-21 MED ORDER — FELBAMATE 600 MG/5ML PO SUSP
900.0000 mg | Freq: Once | ORAL | Status: AC
  Administered 2020-08-21: 900 mg via ORAL
  Filled 2020-08-21: qty 7.5

## 2020-08-21 MED ORDER — ONDANSETRON HCL 4 MG/2ML IJ SOLN: 4.0000 mg | Freq: Four times a day (QID) | INTRAMUSCULAR | Status: DC | PRN

## 2020-08-21 MED ORDER — IOHEXOL 300 MG/ML  SOLN
75.0000 mL | Freq: Once | INTRAMUSCULAR | Status: AC | PRN
  Administered 2020-08-21: 75 mL via INTRAVENOUS

## 2020-08-21 MED ORDER — SENNOSIDES 25 MG PO TABS: 1.0000 | ORAL_TABLET | Freq: Two times a day (BID) | ORAL | 3 refills | Status: DC

## 2020-08-21 MED ORDER — MIDAZOLAM HCL 2 MG/2ML IJ SOLN
2.0000 mg | Freq: Once | INTRAMUSCULAR | Status: AC
  Administered 2020-08-21: 2 mg via INTRAVENOUS
  Filled 2020-08-21: qty 2

## 2020-08-21 MED ORDER — HALOPERIDOL 2 MG PO TABS
2.0000 mg | ORAL_TABLET | Freq: Every day | ORAL | Status: DC
  Filled 2020-08-21 (×8): qty 1

## 2020-08-21 MED ORDER — MAGNESIUM SULFATE 2 GM/50ML IV SOLN
2.0000 g | Freq: Once | INTRAVENOUS | Status: AC
  Administered 2020-08-22: 2 g via INTRAVENOUS
  Filled 2020-08-21: qty 50

## 2020-08-21 MED ORDER — PANTOPRAZOLE SODIUM 40 MG PO TBEC
40.0000 mg | DELAYED_RELEASE_TABLET | Freq: Two times a day (BID) | ORAL | Status: DC
  Filled 2020-08-21 (×2): qty 1

## 2020-08-21 MED ORDER — SODIUM CHLORIDE 0.9 % IV SOLN: Freq: Once | INTRAVENOUS | Status: DC

## 2020-08-21 MED ORDER — SODIUM CHLORIDE 0.9 % IV BOLUS
1000.0000 mL | Freq: Once | INTRAVENOUS | Status: AC
  Administered 2020-08-21: 1000 mL via INTRAVENOUS

## 2020-08-21 MED ORDER — POTASSIUM CHLORIDE 10 MEQ/100ML IV SOLN
10.0000 meq | Freq: Once | INTRAVENOUS | Status: AC
  Administered 2020-08-21: 10 meq via INTRAVENOUS
  Filled 2020-08-21: qty 100

## 2020-08-21 MED ORDER — ENOXAPARIN SODIUM 40 MG/0.4ML ~~LOC~~ SOLN
40.0000 mg | SUBCUTANEOUS | Status: DC
  Administered 2020-08-21 – 2020-08-31 (×11): 40 mg via SUBCUTANEOUS
  Filled 2020-08-21 (×11): qty 0.4

## 2020-08-21 MED ORDER — SORBITOL 70 % SOLN
960.0000 mL | TOPICAL_OIL | Freq: Once | ORAL | Status: AC
  Administered 2020-08-22: 960 mL via RECTAL
  Filled 2020-08-21: qty 473

## 2020-08-21 MED ORDER — LINACLOTIDE 290 MCG PO CAPS
290.0000 ug | ORAL_CAPSULE | Freq: Every day | ORAL | Status: DC
  Administered 2020-08-23 – 2020-08-27 (×2): 290 ug via ORAL
  Filled 2020-08-21 (×10): qty 1

## 2020-08-21 MED ORDER — ONDANSETRON HCL 4 MG PO TABS: 4.0000 mg | ORAL_TABLET | Freq: Four times a day (QID) | ORAL | Status: DC | PRN

## 2020-08-21 MED ORDER — SENNA 8.6 MG PO TABS
25.0000 mg | ORAL_TABLET | Freq: Two times a day (BID) | ORAL | Status: DC
  Administered 2020-08-23: 25.8 mg via ORAL
  Filled 2020-08-21 (×8): qty 3

## 2020-08-21 MED ORDER — MORPHINE SULFATE (PF) 2 MG/ML IV SOLN: 1.0000 mg | Freq: Once | INTRAVENOUS | Status: DC

## 2020-08-21 MED ORDER — FELBAMATE 600 MG/5ML PO SUSP
600.0000 mg | Freq: Two times a day (BID) | ORAL | Status: DC
  Filled 2020-08-21 (×2): qty 5

## 2020-08-21 NOTE — ED Provider Notes (Signed)
[facility] Emergency Department Provider Note ____________________________________________   First MD Initiated Contact with Patient 08/21/20 1755     (approximate)  I have reviewed the triage vital signs and the nursing notes.   HISTORY  Chief Complaint Abdominal Pain  Level 5 caveat: History present illness limited due to autism  HPI Jonathan Kirby is a 34 y.o. male with PMH as noted below including a history of autism, chronic constipation, esophagitis, and seizure disorder who presents with constipation and abdominal distention.  The parents report that the patient had a small bowel movement about 4 days ago, but otherwise has not had a large bowel movement in a while.  They have given an enema at home with no relief.  He has not had any appetite over the last several days and has been drinking very little.  He appears to be in pain.  The patient himself is unable to give any history directly.  Past Medical History:  Diagnosis Date  . Autism   . GERD (gastroesophageal reflux disease)   . Seizures [facility])     Patient Active Problem List   Diagnosis Date Noted  . Abdominal pain 08/21/2020  . Hypokalemia 08/21/2020  . Hypomagnesemia 08/21/2020  . Acute esophagitis   . Seizure disorder (HCC)   . Constipation 04/14/2020  . Decreased oral intake 04/14/2020  . Dysphagia 04/14/2020  . 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 Autism spectrum disorder with accompanying language impairment and intellectual disability, requiring very substantial support 07/23/2014  . Generalized convulsive epilepsy (HCC) 04/02/2013  . Partial epilepsy with impairment of consciousness (HCC) 04/02/2013  . Moderate intellectual disabilities 04/02/2013  . Insomnia, unspecified 04/02/2013  . Intermittent explosive disorder 04/02/2013  . Mental and behavioral problem 07/17/86    Past Surgical History:  Procedure Laterality Date  . CIRCUMCISION  1987   . ESOPHAGOGASTRODUODENOSCOPY N/A 04/17/2020   Procedure: ESOPHAGOGASTRODUODENOSCOPY (EGD);  Surgeon: Pasty Spillers, MD;  Location: [facility] ENDOSCOPY;  Service: Endoscopy;  Laterality: N/A;    Prior to Admission medications   Medication Sig Start Date End Date Taking? Authorizing Provider  felbamate (FELBATOL) 600 MG/5ML suspension Take 7.5 mL by mouth twice daily 07/25/20  Yes Hickling, Deanna Artis, MD  haloperidol (HALDOL) 2 MG tablet Crush 1 tablet in place in a puree Patient taking differently: as needed. Crush 1 tablet in place in a puree 07/25/20  Yes Hickling, Deanna Artis, MD  pantoprazole (PROTONIX) 40 MG tablet Take 1 tablet (40 mg total) by mouth 2 (two) times daily. 04/30/20 08/21/20 Yes Tahiliani, Dolphus Jenny, MD  Plecanatide (TRULANCE) 3 MG TABS Take 3 mg by mouth daily.   Yes [provider]  Sennosides 25 MG TABS Take 1 tablet (25 mg total) by mouth in the morning and at bedtime. 08/21/20  Yes Tahiliani, Michel Bickers B, MD  sodium phosphate (FLEET) 7-19 GM/118ML ENEM Place 1 enema rectally every three (3) days as needed. If no bowel movement 04/28/20  Yes [provider]  linaclotide (LINZESS) 290 MCG CAPS capsule Take 1 capsule (290 mcg total) by mouth daily. Patient not taking: Reported on 08/21/2020 07/17/20   Pasty Spillers, MD    Allergies Patient has no known allergies.  Family History  Problem Relation Age of Onset  . Cancer Maternal Grandmother        Died at 42  . Alzheimer's disease Maternal Grandfather        Died at 29  . Stroke Paternal Grandfather  Died at 71    Social History Social History   Tobacco Use  . Smoking status: Never Smoker  . Smokeless tobacco: Never Used  Substance Use Topics  . Alcohol use: No    Alcohol/week: 0.0 standard drinks  . Drug use: No    Review of Systems Level 5 caveat: Unable to obtain review of systems due to autism   ____________________________________________   PHYSICAL EXAM:  VITAL  SIGNS: ED Triage Vitals  Enc Vitals Group     BP 08/21/20 1741 (!) 137/101     Pulse Rate 08/21/20 1741 (!) 145     Resp 08/21/20 1741 18     Temp 08/21/20 1741 98.7 F (37.1 C)     Temp Source 08/21/20 1741 Oral     SpO2 08/21/20 1741 99 %     Weight 08/21/20 1742 107 lb (48.5 kg)     Height 08/21/20 1742 5\' 5"  (1.651 m)     Head Circumference --      Peak Flow --      Pain Score --      Pain Loc --      Pain Edu? --      Excl. in GC? --     Constitutional: Alert.  Uncomfortable appearing but in no acute distress Eyes: Conjunctivae are normal.  Head: Atraumatic. Nose: No congestion/rhinnorhea. Mouth/Throat: Mucous membranes are moist.   Neck: Normal range of motion.  Cardiovascular: Normal rate, regular rhythm.  Good peripheral circulation. Respiratory: Normal respiratory effort.  No retractions.  Gastrointestinal: Moderate distention, somewhat firm abdomen.  No focal tenderness. Genitourinary: No flank tenderness. Musculoskeletal: Extremities warm and well perfused.  Neurologic: Motor intact in all extremities Skin:  Skin is warm and dry. No rash noted. Psychiatric: Calm and cooperative  ____________________________________________   LABS (all labs ordered are listed, but only abnormal results are displayed)  Labs Reviewed  COMPREHENSIVE METABOLIC PANEL - Abnormal; Notable for the following components:      Result Value   Potassium 2.8 (*)    Glucose, Bld 136 (*)    Total Protein 8.5 (*)    ALT 48 (*)    Anion gap 17 (*)    All other components within normal limits  CBC - Abnormal; Notable for the following components:   WBC 13.5 (*)    All other components within normal limits  RESP PANEL BY RT-PCR (FLU A&B, COVID) ARPGX2  LIPASE, BLOOD  LACTIC ACID, PLASMA  MAGNESIUM  URINALYSIS, COMPLETE (UACMP) WITH MICROSCOPIC  HIV ANTIBODY (ROUTINE TESTING W REFLEX)  BASIC METABOLIC PANEL  CBC  TROPONIN I (HIGH SENSITIVITY)    ____________________________________________  EKG  ED ECG REPORT I, , the attending physician, personally viewed and interpreted this ECG.  Date: 08/21/2020 EKG Time: 1741 Rate: 151 Rhythm: Sinus tachycardia QRS Axis: normal Intervals: normal ST/T Wave abnormalities: normal Narrative Interpretation: Sinus tachycardia with no evidence of acute ischemia  ____________________________________________  RADIOLOGY  XR abdomen interpreted by me shows significant distention of the colon, similar to prior x-rays  ____________________________________________   PROCEDURES  Procedure(s) performed: No  Procedures  Critical Care performed: No ____________________________________________   INITIAL IMPRESSION / ASSESSMENT AND PLAN / ED COURSE  Pertinent labs & imaging results that were available during my care of the patient were reviewed by me and considered in my medical decision making (see chart for details).  34 year old male with history of autism, esophagitis, and seizure disorder presents with constipation over at least the last week with no bowel  movement at all in the last 4 days.  He has a history of chronic constipation but has not responded to enemas or other treatments at home and has not been eating or drinking.  I reviewed the past medical records in Epic.  I saw this patient in July in the ED with a similar presentation, except he had no abdominal distention at that time.  At that time he eventually responded to laxatives and began tolerating p.o. again.  EGD showed grade C reflux esophagitis.  He has been following with GI.  On exam today the patient is uncomfortable appearing.  He is tachycardic with otherwise normal vital signs.  The abdomen is moderately distended and firm although I cannot elicit any focal tenderness in the specific area.  Differential includes chronic constipation, ileus, small bowel obstruction, volvulus or other large bowel  etiology.  We will obtain abdominal plain films, lab work-up, give fluids, and reassess.  ----------------------------------------- 12:09 AM on 08/22/2020 ----------------------------------------  The x-ray showed significant colonic distention and volvulus could not be ruled out. I proceeded with a CT to further evaluate. This is consistent with an ileus rather than volvulus or other acute obstructive process. I ordered a smog enema. Given that the patient is not tolerating p.o. and is dehydrated, I discussed his case with Dr. Joylene Igo from the hospitalist service for admission.  ____________________________________________   FINAL CLINICAL IMPRESSION(S) / ED DIAGNOSES  Final diagnoses:  Constipation, unspecified constipation type  Ileus (HCC)      NEW MEDICATIONS STARTED DURING THIS VISIT:  New Prescriptions   No medications on file     Note:  This document was prepared using Dragon voice recognition software and may include unintentional dictation errors.    Dionne Bucy, MD 08/22/20 0010

## 2020-08-21 NOTE — ED Triage Notes (Signed)
Pt here with parents (legal guardian's). Pt autistic. Pt here with abdominal distension, last bowel movement was a small amount on Sunday 11/28.  Parents administered fleet enema on Tuesday with no relief. Pt now very distended and uncomfortable. Parents reported decreased oral intake, decreased urine output.

## 2020-08-21 NOTE — H&P (Signed)
History and Physical    Jonathan Kirby:[id number] DOB: [birth year] DOA: 08/21/2020  PCP: Almetta Lovely, [facility]   Patient coming from: Home  I have personally briefly reviewed patient's old medical records in [facility]  Chief Complaint: Abdominal distention Most of the history is obtained from patient's mother at the bedside  HPI: Jonathan Kirby is a 34 y.o. male with medical history significant  for severe autism, nonverbal at baseline, who was brought in by his parents for evaluation of abdominal distention and poor oral intake for couple of days.  His mother states that his last bowel movement was a small amount on Sunday 11/28 despite giving him laxatives and enemas.  She got concerned because patient's abdomen is very distended and he appears uncomfortable.  He also has very poor oral intake and decreased urine output. I am unable to do a review of systems on this patient due to his severe autism but he appears uncomfortable and is tachycardic. Labs show sodium 143, potassium 2.8, chloride 99, bicarb 27, glucose 136, BUN 20, creatinine 0.8, calcium 9.1, magnesium 1.7, alkaline phosphatase 97, albumin 4.1, lipase 18, AST 30, ALT 48, total protein 8.5, total bilirubin 1.1, lactic acid 1.8, white count 13.5, hemoglobin 13.7, hematocrit 41.4, MCV 89.4, RDW 15.1, platelet count 312 Abdominal x-ray shows diffuse gaseous distension of the colon, possibly chronic and representing adynamic ileus. Sigmoid volvulus is not excluded. CT  may provide better evaluation if clinically indicated. CT scan of abdomen pelvis shows diffuse distension of the colon with gas consistent with a generalized colonic ileus. No definitive obstructing lesion is seen. Fluid and fecal material is noted within the rectum although no definitive impaction is noted. No other focal abnormality is seen. Twelve-lead EKG shows supraventricular tachycardia    ED Course: Patient is a 34 year old African-American male  with history of severe autism who was brought in by his parents for evaluation of abdominal distention and constipation.  Patient appears uncomfortable and is tachycardic on the monitor.  Abdomen is distended and firm. SMOG enema has been ordered in the ER.  Review of Systems: As per HPI otherwise 10 point review of systems negative.    Past Medical History:  Diagnosis Date  . Autism   . GERD (gastroesophageal reflux disease)   . Seizures (HCC)     Past Surgical History:  Procedure Laterality Date  . CIRCUMCISION  1987  . ESOPHAGOGASTRODUODENOSCOPY N/A 04/17/2020   Procedure: ESOPHAGOGASTRODUODENOSCOPY (EGD);  Surgeon: Pasty Spillers, MD;  Location: [facility] ENDOSCOPY;  Service: Endoscopy;  Laterality: N/A;     reports that he has never smoked. He has never used smokeless tobacco. He reports that he does not drink alcohol and does not use drugs.  No Known Allergies  Family History  Problem Relation Age of Onset  . Cancer Maternal Grandmother        Died at 44  . Alzheimer's disease Maternal Grandfather        Died at 34  . Stroke Paternal Grandfather        Died at 2     Prior to Admission medications   Medication Sig Start Date End Date Taking? Authorizing Provider  felbamate (FELBATOL) 600 MG/5ML suspension Take 7.5 mL by mouth twice daily 07/25/20   Deetta Perla, MD  haloperidol (HALDOL) 2 MG tablet Crush 1 tablet in place in a puree 07/25/20   Deetta Perla, MD  linaclotide (LINZESS) 290 MCG CAPS capsule Take 1 capsule (290 mcg total) by  mouth daily. 07/17/20   Pasty Spillersahiliani, Varnita B, MD  pantoprazole (PROTONIX) 40 MG tablet Take 1 tablet (40 mg total) by mouth 2 (two) times daily. 04/30/20 05/30/20  Pasty Spillersahiliani, Varnita B, MD  Sennosides 25 MG TABS Take 1 tablet (25 mg total) by mouth in the morning and at bedtime. 08/21/20   Pasty Spillersahiliani, Varnita B, MD  sodium phosphate (FLEET) 7-19 GM/118ML ENEM  04/28/20   [provider]    Physical Exam: Vitals:    08/21/20 2100 08/21/20 2130 08/21/20 2230 08/21/20 2300  BP: (!) 122/94 (!) 121/96 120/72 115/82  Pulse: (!) 121  (!) 121   Resp: 19 17 (!) 21   Temp:      TempSrc:      SpO2: 100%  99%   Weight:      Height:         Vitals:   08/21/20 2100 08/21/20 2130 08/21/20 2230 08/21/20 2300  BP: (!) 122/94 (!) 121/96 120/72 115/82  Pulse: (!) 121  (!) 121   Resp: 19 17 (!) 21   Temp:      TempSrc:      SpO2: 100%  99%   Weight:      Height:        Constitutional: NAD, alert and awake Eyes: PERRL, lids and conjunctivae normal ENMT: Mucous membranes are dry  Neck: normal, supple, no masses, no thyromegaly Respiratory: clear to auscultation bilaterally, no wheezing, no crackles. Normal respiratory effort. No accessory muscle use.  Cardiovascular: Tachycardic,no murmurs / rubs / gallops. No extremity edema. 2+ pedal pulses. No carotid bruits.  Abdomen: Diffusely tender, firm, no masses palpated. No hepatosplenomegaly.  Hypoactive bowel sounds Musculoskeletal: no clubbing / cyanosis. No joint deformity upper and lower extremities.  Skin: no rashes, lesions, ulcers.  Neurologic: No gross focal neurologic deficit.  Moves all extremities Psychiatric: Normal mood and affect.   Labs on Admission: I have personally reviewed following labs and imaging studies  CBC: Recent Labs  Lab 08/21/20 1746  WBC 13.5*  HGB 13.7  HCT 41.4  MCV 89.4  PLT 312   Basic Metabolic Panel: Recent Labs  Lab 08/21/20 1746 08/21/20 2159  NA 143  --   K 2.8*  --   CL 99  --   CO2 27  --   GLUCOSE 136*  --   BUN 20  --   CREATININE 0.88  --   CALCIUM 9.1  --   MG  --  1.7   GFR: Estimated Creatinine Clearance: 81.1 mL/min (by C-G formula based on SCr of 0.88 mg/dL). Liver Function Tests: Recent Labs  Lab 08/21/20 1746  AST 30  ALT 48*  ALKPHOS 97  BILITOT 1.1  PROT 8.5*  ALBUMIN 4.1   Recent Labs  Lab 08/21/20 1746  LIPASE 18   No results for input(s): AMMONIA in the last 168  hours. Coagulation Profile: No results for input(s): INR, PROTIME in the last 168 hours. Cardiac Enzymes: No results for input(s): CKTOTAL, CKMB, CKMBINDEX, TROPONINI in the last 168 hours. BNP (last 3 results) No results for input(s): PROBNP in the last 8760 hours. HbA1C: No results for input(s): HGBA1C in the last 72 hours. CBG: No results for input(s): GLUCAP in the last 168 hours. Lipid Profile: No results for input(s): CHOL, HDL, LDLCALC, TRIG, CHOLHDL, LDLDIRECT in the last 72 hours. Thyroid Function Tests: No results for input(s): TSH, T4TOTAL, FREET4, T3FREE, THYROIDAB in the last 72 hours. Anemia Panel: No results for input(s): VITAMINB12, FOLATE, FERRITIN, TIBC,  IRON, RETICCTPCT in the last 72 hours. Urine analysis:    Component Value Date/Time   COLORURINE AMBER (A) 04/14/2020 1439   APPEARANCEUR HAZY (A) 04/14/2020 1439   LABSPEC 1.043 (H) 04/14/2020 1439   PHURINE 5.0 04/14/2020 1439   GLUCOSEU NEGATIVE 04/14/2020 1439   HGBUR NEGATIVE 04/14/2020 1439   BILIRUBINUR MODERATE (A) 04/14/2020 1439   KETONESUR 20 (A) 04/14/2020 1439   PROTEINUR 100 (A) 04/14/2020 1439   NITRITE NEGATIVE 04/14/2020 1439   LEUKOCYTESUR NEGATIVE 04/14/2020 1439    Radiological Exams on Admission: CT ABDOMEN PELVIS W CONTRAST  Result Date: 08/21/2020 CLINICAL DATA:  Abdominal distension and constipation, initial encounter EXAM: CT ABDOMEN AND PELVIS WITH CONTRAST TECHNIQUE: Multidetector CT imaging of the abdomen and pelvis was performed using the standard protocol following bolus administration of intravenous contrast. CONTRAST:  28mL OMNIPAQUE IOHEXOL 300 MG/ML  SOLN COMPARISON:  04/18/2020 FINDINGS: Lower chest: No acute abnormality. Hepatobiliary: No focal liver abnormality is seen. No gallstones, gallbladder wall thickening, or biliary dilatation. Pancreas: Unremarkable. No pancreatic ductal dilatation or surrounding inflammatory changes. Spleen: Normal in size without focal abnormality.  Adrenals/Urinary Tract: Adrenal glands are within normal limits. The kidneys demonstrate a normal enhancement pattern bilaterally. No obstructive changes are seen. The bladder is partially distended. Stomach/Bowel: Significant gaseous distension of the colon consistent with a colonic ileus. No definitive obstructive lesion is seen. Air distended small bowel is noted as well also likely related to the underlying ileus. Fluid and fecal material is noted within the rectum although no definitive impaction is seen. Stomach is decompressed. Vascular/Lymphatic: No significant vascular findings are present. No enlarged abdominal or pelvic lymph nodes. Reproductive: Prostate is unremarkable. Other: No abdominal wall hernia or abnormality. No abdominopelvic ascites. Musculoskeletal: No acute or significant osseous findings. IMPRESSION: Diffuse distension of the colon with gas consistent with a generalized colonic ileus. No definitive obstructing lesion is seen. Fluid and fecal material is noted within the rectum although no definitive impaction is noted. No other focal abnormality is seen. Electronically Signed   By: Alcide Clever M.D.   On: 08/21/2020 20:37   DG Abd Portable 2 Views  Result Date: 08/21/2020 CLINICAL DATA:  34 year old male with constipation and abdominal distension. EXAM: PORTABLE ABDOMEN - 2 VIEW COMPARISON:  Abdominal radiograph dated 04/17/2020. CT dated 04/18/2020. FINDINGS: There is diffuse gaseous distension of redundant colon. Large amount of fecal matter noted in the proximal colon. No definite small bowel obstruction. Evaluation however is limited due to diffuse colonic distension. There is superior distension of the sigmoid to the left upper abdomen. Sigmoid volvulus is not excluded. CT may provide better evaluation if clinically indicated. No obvious free air. No radiopaque calculi. The osseous structures are intact. IMPRESSION: Diffuse gaseous distension of the colon, possibly chronic and  representing adynamic ileus. Sigmoid volvulus is not excluded. CT may provide better evaluation if clinically indicated. Electronically Signed   By: Elgie Collard M.D.   On: 08/21/2020 19:22    EKG: Independently reviewed.  Sinus tachycardia  Assessment/Plan Principal Problem:   Abdominal pain Active Problems:   Autism spectrum disorder with accompanying language impairment and intellectual disability, requiring very substantial support   Constipation   Decreased oral intake   Hypokalemia   Hypomagnesemia     Abdominal pain Secondary to colonic ileus with no evidence of obstruction.  Fluid and fecal material noted within the rectum with no evidence of impaction Patient received SMOG enema in the ER Continue IV fluid hydration May request GI consult in a.m. (  patient follows with [facility])     Generalized convulsive epilepsy (HCC) Continue felbamate Seizure precautions     Autism spectrum disorder with accompanying language impairment and intellectual disability, requiring very substantial support Increased nursing assistance, sitter required Liberal parental sitting/visitation   Hypokalemia/hypomagnesemia Supplement potassium and magnesium    Tachycardia Most likely related to volume depletion as well as pain Aggressive IV fluid hydration Trial of morphine for pain control  DVT prophylaxis: Lovenox Code Status: Full code Family Communication: Greater than 50% of time was spent discussing patient's condition and plan of care with his parents at the bedside.  All questions and concerns have been addressed.  They verbalized understanding and agree with the plan. Disposition Plan: Back to previous home environment Consults called: Please request GI consult in a.m.    Lucile Shutters MD [facility]     08/21/2020, 11:26 PM

## 2020-08-21 NOTE — Telephone Encounter (Signed)
Patient's mother-Jonathan Kirby called back stating that she does not agree on giving Jonathan Kirby a higher dose of Senna (25 MG BID) as this had been spoken of before with physician. She stated that she had given him Senna last night and he still had not had a bowel movement for the past 3 days. Jonathan Kirby believes that he is dehydrated and his abdomen is bloated. She is afraid that he will have to end up in the hospital with him. Any other recommendation?

## 2020-08-21 NOTE — Telephone Encounter (Signed)
Called patient's mother-Darlene and had to eave her a detailed message letting her know that Dr. Maximino Greenland recommends for Jonathan Kirby to have an abdominal X-ray.

## 2020-08-21 NOTE — Telephone Encounter (Signed)
Patient's mother called wanting to add that Jonathan Kirby is not eating well, not drinking any fluids and not sleeping at all. She stated that his abdomen is bloated and knows that he is full of stool but after giving him enemas for two days BID and nothing coming out, she is worried. She also stated that he is probably dehydrated because his urine was very yellow this morning. Jonathan Kirby continues to have difficulty drinking fluids and Jonathan Kirby wants to know if she could continue giving him popsicles as this is the only way to give him "fluid" and staying down. She and her husband will talk about scheduling Jonathan Kirby an EGD soon since he continues to have difficulty. Jonathan Kirby is still giving Jonathan Kirby Jonathan Kirby twice a day and even with that he continues to have reflux and things come up all the time. Jonathan Kirby's last question is if she is able to give Jonathan Kirby another enema as advised before when he was going through this in October. Please advise.

## 2020-08-22 ENCOUNTER — Other Ambulatory Visit: Payer: Medicaid Other

## 2020-08-22 DIAGNOSIS — Z820 Family history of epilepsy and other diseases of the nervous system: Secondary | ICD-10-CM | POA: Diagnosis not present

## 2020-08-22 DIAGNOSIS — K2100 Gastro-esophageal reflux disease with esophagitis, without bleeding: Secondary | ICD-10-CM | POA: Diagnosis present

## 2020-08-22 DIAGNOSIS — E870 Hyperosmolality and hypernatremia: Secondary | ICD-10-CM | POA: Diagnosis present

## 2020-08-22 DIAGNOSIS — N390 Urinary tract infection, site not specified: Secondary | ICD-10-CM | POA: Diagnosis present

## 2020-08-22 DIAGNOSIS — E871 Hypo-osmolality and hyponatremia: Secondary | ICD-10-CM | POA: Diagnosis not present

## 2020-08-22 DIAGNOSIS — R140 Abdominal distension (gaseous): Secondary | ICD-10-CM | POA: Diagnosis not present

## 2020-08-22 DIAGNOSIS — K567 Ileus, unspecified: Secondary | ICD-10-CM | POA: Diagnosis present

## 2020-08-22 DIAGNOSIS — Z823 Family history of stroke: Secondary | ICD-10-CM | POA: Diagnosis not present

## 2020-08-22 DIAGNOSIS — E869 Volume depletion, unspecified: Secondary | ICD-10-CM | POA: Diagnosis present

## 2020-08-22 DIAGNOSIS — K2210 Ulcer of esophagus without bleeding: Secondary | ICD-10-CM | POA: Diagnosis not present

## 2020-08-22 DIAGNOSIS — E43 Unspecified severe protein-calorie malnutrition: Secondary | ICD-10-CM | POA: Diagnosis present

## 2020-08-22 DIAGNOSIS — G928 Other toxic encephalopathy: Secondary | ICD-10-CM | POA: Diagnosis present

## 2020-08-22 DIAGNOSIS — R638 Other symptoms and signs concerning food and fluid intake: Secondary | ICD-10-CM | POA: Diagnosis not present

## 2020-08-22 DIAGNOSIS — Z681 Body mass index (BMI) 19 or less, adult: Secondary | ICD-10-CM | POA: Diagnosis not present

## 2020-08-22 DIAGNOSIS — K5981 Ogilvie syndrome: Secondary | ICD-10-CM | POA: Diagnosis present

## 2020-08-22 DIAGNOSIS — I471 Supraventricular tachycardia: Secondary | ICD-10-CM | POA: Diagnosis present

## 2020-08-22 DIAGNOSIS — R109 Unspecified abdominal pain: Secondary | ICD-10-CM | POA: Diagnosis not present

## 2020-08-22 DIAGNOSIS — G9341 Metabolic encephalopathy: Secondary | ICD-10-CM | POA: Diagnosis not present

## 2020-08-22 DIAGNOSIS — E440 Moderate protein-calorie malnutrition: Secondary | ICD-10-CM | POA: Diagnosis not present

## 2020-08-22 DIAGNOSIS — F79 Unspecified intellectual disabilities: Secondary | ICD-10-CM | POA: Diagnosis present

## 2020-08-22 DIAGNOSIS — G40409 Other generalized epilepsy and epileptic syndromes, not intractable, without status epilepticus: Secondary | ICD-10-CM | POA: Diagnosis present

## 2020-08-22 DIAGNOSIS — K5900 Constipation, unspecified: Secondary | ICD-10-CM | POA: Diagnosis not present

## 2020-08-22 DIAGNOSIS — Z8719 Personal history of other diseases of the digestive system: Secondary | ICD-10-CM | POA: Diagnosis not present

## 2020-08-22 DIAGNOSIS — F840 Autistic disorder: Secondary | ICD-10-CM | POA: Diagnosis present

## 2020-08-22 DIAGNOSIS — Z20822 Contact with and (suspected) exposure to covid-19: Secondary | ICD-10-CM | POA: Diagnosis present

## 2020-08-22 DIAGNOSIS — Z79899 Other long term (current) drug therapy: Secondary | ICD-10-CM | POA: Diagnosis not present

## 2020-08-22 DIAGNOSIS — K2970 Gastritis, unspecified, without bleeding: Secondary | ICD-10-CM | POA: Diagnosis present

## 2020-08-22 DIAGNOSIS — F809 Developmental disorder of speech and language, unspecified: Secondary | ICD-10-CM | POA: Diagnosis present

## 2020-08-22 DIAGNOSIS — E876 Hypokalemia: Secondary | ICD-10-CM | POA: Diagnosis present

## 2020-08-22 DIAGNOSIS — J690 Pneumonitis due to inhalation of food and vomit: Secondary | ICD-10-CM | POA: Diagnosis not present

## 2020-08-22 DIAGNOSIS — R569 Unspecified convulsions: Secondary | ICD-10-CM | POA: Diagnosis not present

## 2020-08-22 LAB — CBC
HCT: 40 % (ref 39.0–52.0)
Hemoglobin: 13.4 g/dL (ref 13.0–17.0)
MCH: 29.8 pg (ref 26.0–34.0)
MCHC: 33.5 g/dL (ref 30.0–36.0)
MCV: 89.1 fL (ref 80.0–100.0)
Platelets: 295 10*3/uL (ref 150–400)
RBC: 4.49 MIL/uL (ref 4.22–5.81)
RDW: 15.1 % (ref 11.5–15.5)
WBC: 13.7 10*3/uL — ABNORMAL HIGH (ref 4.0–10.5)
nRBC: 0 % (ref 0.0–0.2)

## 2020-08-22 LAB — BASIC METABOLIC PANEL
Anion gap: 12 (ref 5–15)
BUN: 14 mg/dL (ref 6–20)
CO2: 25 mmol/L (ref 22–32)
Calcium: 8.6 mg/dL — ABNORMAL LOW (ref 8.9–10.3)
Chloride: 108 mmol/L (ref 98–111)
Creatinine, Ser: 0.71 mg/dL (ref 0.61–1.24)
GFR, Estimated: >60 mL/min (ref >60)
Glucose, Bld: 161 mg/dL — ABNORMAL HIGH (ref 70–99)
Potassium: 3.4 mmol/L — ABNORMAL LOW (ref 3.5–5.1)
Sodium: 145 mmol/L (ref 135–145)

## 2020-08-22 LAB — URINALYSIS, COMPLETE (UACMP) WITH MICROSCOPIC
Bacteria, UA: NONE SEEN
Bilirubin Urine: NEGATIVE
Glucose, UA: >=500 mg/dL — AB
Ketones, ur: 20 mg/dL — AB
Leukocytes,Ua: NEGATIVE
Nitrite: NEGATIVE
Protein, ur: 30 mg/dL — AB
Specific Gravity, Urine: 1.027 (ref 1.005–1.030)
pH: 7 (ref 5.0–8.0)

## 2020-08-22 LAB — HIV ANTIBODY (ROUTINE TESTING W REFLEX): HIV Screen 4th Generation wRfx: NONREACTIVE

## 2020-08-22 MED ORDER — LACTULOSE 10 GM/15ML PO SOLN
20.0000 g | Freq: Two times a day (BID) | ORAL | Status: DC
  Administered 2020-08-23 – 2020-08-24 (×3): 20 g via ORAL
  Filled 2020-08-22 (×5): qty 30

## 2020-08-22 MED ORDER — KETOROLAC TROMETHAMINE 15 MG/ML IJ SOLN
15.0000 mg | Freq: Three times a day (TID) | INTRAMUSCULAR | Status: AC | PRN
  Administered 2020-08-26: 15 mg via INTRAVENOUS
  Filled 2020-08-22 (×2): qty 1

## 2020-08-22 MED ORDER — FELBAMATE 600 MG/5ML PO SUSP
900.0000 mg | Freq: Two times a day (BID) | ORAL | Status: DC
  Administered 2020-08-22 – 2020-08-27 (×12): 900 mg via ORAL
  Filled 2020-08-22 (×16): qty 7.5

## 2020-08-22 MED ORDER — KCL IN DEXTROSE-NACL 20-5-0.45 MEQ/L-%-% IV SOLN
INTRAVENOUS | Status: DC
  Filled 2020-08-22 (×3): qty 1000

## 2020-08-22 MED ORDER — PANTOPRAZOLE SODIUM 40 MG PO PACK
40.0000 mg | PACK | Freq: Two times a day (BID) | ORAL | Status: DC
  Administered 2020-08-22 – 2020-08-25 (×6): 40 mg via ORAL
  Filled 2020-08-22 (×7): qty 20

## 2020-08-22 NOTE — Telephone Encounter (Signed)
Patient's mother-Darlene sent Dr. Maximino Greenland a MyChart message letting her know that she had to take patient to the hospital last night due to not been able to have a bowel movement and complaining of having abdominal pain. Darlene also stated that she was waiting for Hykeem to be admitted. Dr. Felipa Emory was informed through secure chat.

## 2020-08-22 NOTE — Consult Note (Signed)
[name], MD [address], [place], [place], [address] [address], [place], [place], [address] Phone: [phone]  Fax: [phone]  Consultation  Referring Provider:     Dr. Chipper Herb Primary Care Physician:  Almetta Lovely, [facility] Reason for Consultation:    Fecal impaction  Date of Admission:  08/21/2020 Date of Consultation:  08/22/2020         HPI:   Jonathan Kirby is a 34 y.o. male with severe autism and chronic constipation, presented with abdominal distention with CT showing diffuse colon distention, generalized colonic ileus, air distended small bowel due to underlying ileus.  Fluid and fecal material is noted in the rectum on the CT scan but it reports "no definitive impaction is seen".  History is provided by mother, Jonathan Kirby, at bedside as patient unable to provide history.  Patient has not had any nausea or vomiting.  Mother states after enema given in the ER yesterday, patient's abdomen did become soft but has become distended again today.  She states patient has had 2 enemas since presentation but did not have good stool output, only some watery output after the enemas.  As an outpatient patient has been on multiple medications for constipation with variable results and history limited due to his autism.    Past Medical History:  Diagnosis Date  . Autism   . GERD (gastroesophageal reflux disease)   . Seizures (HCC)     Past Surgical History:  Procedure Laterality Date  . CIRCUMCISION  1987  . ESOPHAGOGASTRODUODENOSCOPY N/A 04/17/2020   Procedure: ESOPHAGOGASTRODUODENOSCOPY (EGD);  Surgeon: Pasty Spillers, MD;  Location: [facility] ENDOSCOPY;  Service: Endoscopy;  Laterality: N/A;    Prior to Admission medications   Medication Sig Start Date End Date Taking? Authorizing Provider  felbamate (FELBATOL) 600 MG/5ML suspension Take 7.5 mL by mouth twice daily 07/25/20  Yes Hickling, Deanna Artis, MD  haloperidol (HALDOL) 2 MG tablet Crush 1  tablet in place in a puree Patient taking differently: as needed. Crush 1 tablet in place in a puree 07/25/20  Yes Hickling, Deanna Artis, MD  pantoprazole (PROTONIX) 40 MG tablet Take 1 tablet (40 mg total) by mouth 2 (two) times daily. 04/30/20 08/21/20 Yes Srinidhi Landers, Dolphus Jenny, MD  Plecanatide (TRULANCE) 3 MG TABS Take 3 mg by mouth daily.   Yes [provider]  Sennosides 25 MG TABS Take 1 tablet (25 mg total) by mouth in the morning and at bedtime. 08/21/20  Yes Mikeila Burgen, Michel Bickers B, MD  sodium phosphate (FLEET) 7-19 GM/118ML ENEM Place 1 enema rectally every three (3) days as needed. If no bowel movement 04/28/20  Yes [provider]  linaclotide (LINZESS) 290 MCG CAPS capsule Take 1 capsule (290 mcg total) by mouth daily. Patient not taking: Reported on 08/21/2020 07/17/20   Pasty Spillers, MD    Family History  Problem Relation Age of Onset  . Cancer Maternal Grandmother        Died at 65  . Alzheimer's disease Maternal Grandfather        Died at 16  . Stroke Paternal Grandfather        Died at 87     Social History   Tobacco Use  . Smoking status: Never Smoker  . Smokeless tobacco: Never Used  Substance Use Topics  . Alcohol use: No    Alcohol/week: 0.0 standard drinks  . Drug use: No    Allergies as of 08/21/2020  . (No Known Allergies)  Review of Systems:    All systems reviewed and negative except where noted in HPI.   Physical Exam:  Vital signs in last 24 hours: Vitals:   08/21/20 2330 08/22/20 0329 08/22/20 0525 08/22/20 1340  BP: (!) 123/93 (!) 118/43 (!) 110/58 (!) 113/91  Pulse:  (!) 115 (!) 105 (!) 108  Resp:  18 17 18   Temp:    98.4 F (36.9 C)  TempSrc:    Oral  SpO2:  100% 100% 99%  Weight:      Height:         General:   Pleasant, cooperative in NAD Head:  Normocephalic and atraumatic. Eyes:   No icterus.   Conjunctiva pink. PERRLA. Ears:  Normal auditory acuity. Neck:  Supple; no masses or thyroidomegaly Lungs:  Respirations even and unlabored. Lungs clear to auscultation bilaterally.   No wheezes, crackles, or rhonchi.  Abdomen:  Soft, distended, nontender. Normal bowel sounds. No appreciable masses or hepatomegaly.  No rebound or guarding.  Rectal exam: No stool in rectal vault Neurologic:  Alert;  grossly normal neurologically. Skin:  Intact without significant lesions or rashes. Cervical Nodes:  No significant cervical adenopathy. Psych:  Alert and cooperative. Normal affect.  LAB RESULTS: Recent Labs    08/21/20 1746 08/22/20 0801  WBC 13.5* 13.7*  HGB 13.7 13.4  HCT 41.4 40.0  PLT 312 295   BMET Recent Labs    08/21/20 1746 08/22/20 0719  NA 143 145  K 2.8* 3.4*  CL 99 108  CO2 27 25  GLUCOSE 136* 161*  BUN 20 14  CREATININE 0.88 0.71  CALCIUM 9.1 8.6*   LFT Recent Labs    08/21/20 1746  PROT 8.5*  ALBUMIN 4.1  AST 30  ALT 48*  ALKPHOS 97  BILITOT 1.1   PT/INR No results for input(s): LABPROT, INR in the last 72 hours.  STUDIES: CT ABDOMEN PELVIS W CONTRAST  Result Date: 08/21/2020 CLINICAL DATA:  Abdominal distension and constipation, initial encounter EXAM: CT ABDOMEN AND PELVIS WITH CONTRAST TECHNIQUE: Multidetector CT imaging of the abdomen and pelvis was performed using the standard protocol following bolus administration of intravenous contrast. CONTRAST:  43mL OMNIPAQUE IOHEXOL 300 MG/ML  SOLN COMPARISON:  04/18/2020 FINDINGS: Lower chest: No acute abnormality. Hepatobiliary: No focal liver abnormality is seen. No gallstones, gallbladder wall thickening, or biliary dilatation. Pancreas: Unremarkable. No pancreatic ductal dilatation or surrounding inflammatory changes. Spleen: Normal in size without focal abnormality. Adrenals/Urinary Tract: Adrenal glands are within normal limits. The kidneys demonstrate a normal enhancement pattern bilaterally. No obstructive changes are seen. The bladder is partially distended. Stomach/Bowel: Significant gaseous distension of  the colon consistent with a colonic ileus. No definitive obstructive lesion is seen. Air distended small bowel is noted as well also likely related to the underlying ileus. Fluid and fecal material is noted within the rectum although no definitive impaction is seen. Stomach is decompressed. Vascular/Lymphatic: No significant vascular findings are present. No enlarged abdominal or pelvic lymph nodes. Reproductive: Prostate is unremarkable. Other: No abdominal wall hernia or abnormality. No abdominopelvic ascites. Musculoskeletal: No acute or significant osseous findings. IMPRESSION: Diffuse distension of the colon with gas consistent with a generalized colonic ileus. No definitive obstructing lesion is seen. Fluid and fecal material is noted within the rectum although no definitive impaction is noted. No other focal abnormality is seen. Electronically Signed   By: 04/20/2020 M.D.   On: 08/21/2020 20:37   DG Abd Portable 2 Views  Result Date: 08/21/2020 CLINICAL  DATA:  34 year old male with constipation and abdominal distension. EXAM: PORTABLE ABDOMEN - 2 VIEW COMPARISON:  Abdominal radiograph dated 04/17/2020. CT dated 04/18/2020. FINDINGS: There is diffuse gaseous distension of redundant colon. Large amount of fecal matter noted in the proximal colon. No definite small bowel obstruction. Evaluation however is limited due to diffuse colonic distension. There is superior distension of the sigmoid to the left upper abdomen. Sigmoid volvulus is not excluded. CT may provide better evaluation if clinically indicated. No obvious free air. No radiopaque calculi. The osseous structures are intact. IMPRESSION: Diffuse gaseous distension of the colon, possibly chronic and representing adynamic ileus. Sigmoid volvulus is not excluded. CT may provide better evaluation if clinically indicated. Electronically Signed   By: Elgie Collard M.D.   On: 08/21/2020 19:22      Impression / Plan:   Jonathan Kirby is a 34 y.o.  y/o male with ileus and colon distention with chronic constipation in the setting of autism  There is no evidence of fecal impaction both on CT scan and on my rectal exam today.  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  At this time patient does not have colon distention over 12 cm  If conservative management does not lead to improvement in symptoms, agree with neostigmine use which would need to be done in the ICU, and then subsequently decompression with colonoscope if neostigmine does not work as well  The following conservative management measures are recommended:  -IV fluids -Keep patient n.p.o. until symptoms improve -Replace electrolytes.  Potassium is low at this point, please replace and recheck and replace further as necessary  -Avoid any medications that can decrease colon motility -Encourage ambulation if tolerated -When in bed it would help to as per up-to-date "placed in a prone position with the hips elevated on a pillow or the knee chest position with the hips held high. These positions should be alternated with right and left lateral decubitus positions each hour." -Place NG tube to low intermittent suction -Place rectal tube to gravity drainage -Administer gentle 250 cc tapwater enemas once or twice a day as needed until bowel movement -Obtain abdominal x-ray every 12-24 hours -Monitor abdominal exam closely and if acute changes in abdominal exam or hemodynamic state to this, please contact GI and surgery on call  Dr. Servando Snare to follow from tomorrow  thank you for involving me in the care of this patient.      LOS: 0 days   Pasty Spillers, MD  08/22/2020, 2:55 PM

## 2020-08-22 NOTE — Plan of Care (Signed)
  Problem: Education: Goal: Knowledge of General Education information will improve Description Including pain rating scale, medication(s)/side effects and non-pharmacologic comfort measures Outcome: Progressing   

## 2020-08-22 NOTE — Progress Notes (Signed)
Patient's parents very concerned about some of the scheduled meds could exacerbate his bloating and discomfort. They state the pt has difficulty swallowing crushed Senokot and Protonix. Also, refusing Lactulose. RN sent msg to pharmacy to change Protonix from pill form to sprinkle or IV form. Received suspension form; pt refused. Will request IV form. Additionally, parents wish to speak with gastro in the morning about these meds and other treatment options. Encouraged parents to try and place patient in prone position with 'bottom up' to facilitate increased motility. They state he is autistic and will resist treatments when in different environment. RN will continue to monitor.

## 2020-08-22 NOTE — ED Notes (Signed)
Patient experienced one bm with passage of white liquid same in nature to [facility] enema performed. No stool noted. Pericare completed, linens changed, and gown changed. Pt positioned back in bed for comfort. Mother reports pt abd significantly less distended despite lack of stool in bm.

## 2020-08-22 NOTE — Progress Notes (Signed)
PROGRESS NOTE    Jonathan Kirby  BZJ:[id number] DOB: 1985/10/11 DOA: 08/21/2020 PCP: Almetta Lovely, [facility]   Chief complaint.  Severe constipation Brief Narrative:  Jonathan Kirby is a 34 y.o. male with medical history significant  forsevere autism, nonverbal at baseline, who was brought in by his parents for evaluation of abdominal distention and poor oral intake for couple of days.  His mother states that his last bowel movement was a small amount on Sunday 11/28 despite giving him laxatives and enemas.  She got concerned because patient's abdomen is very distended and he appears uncomfortable.  He also has very poor oral intake and decreased urine output. CT scan abdomen/pelvis with contrast showed severe dilated colon.  Patient has been seen by general surgery, no surgical indication.    Assessment & Plan:   Principal Problem:   Abdominal pain Active Problems:   Autism spectrum disorder with accompanying language impairment and intellectual disability, requiring very substantial support   Constipation   Decreased oral intake   Hypokalemia   Hypomagnesemia  #1.  Colonic ileus with Ogilvie syndrome. I have personally reviewed the patient CT abdomen pelvis images, patient has severe dilated colon.  No evidence of perforation.  Surgery has suggested neostigmine, will obtain GI consult. Due to severe distention of the stomach, will keep patient n.p.o. and continue IV fluids for now.  2.  Hypokalemia.  Hypomagnesemia Continue fluids with potassium.  Recheck a BMP and magnesium in the morning.  3.  Autism spectrum disorder with language impairment and intellectual disability.      DVT prophylaxis:  Lovenox Code Status: Full Family Communication: Both parents are present in the room. Disposition Plan:  .   Status is: Observation  The patient will require care spanning > 2 midnights and should be moved to inpatient because: Inpatient level of care appropriate due to  severity of illness  Dispo: The patient is from: Home              Anticipated d/c is to: Home              Anticipated d/c date is: 2 days              Patient currently is not medically stable to d/c.        I/O last 3 completed shifts: In: 1148 [IV Piggyback:1148] Out: -  No intake/output data recorded.     Consultants:   GI, GS  Procedures: None  Antimicrobials: None  Subjective: Patient is nonverbal.  He appears to be uncomfortable from abdominal distention.  He does not seem to have any short of breath.  No fever or chills.   Objective: Vitals:   08/21/20 2300 08/21/20 2330 08/22/20 0329 08/22/20 0525  BP: 115/82 (!) 123/93 (!) 118/43 (!) 110/58  Pulse:   (!) 115 (!) 105  Resp:   18 17  Temp:      TempSrc:      SpO2:   100% 100%  Weight:      Height:        Intake/Output Summary (Last 24 hours) at 08/22/2020 1317 Last data filed at 08/22/2020 0328 Gross per 24 hour  Intake 1147.99 ml  Output --  Net 1147.99 ml   Filed Weights   08/21/20 1742  Weight: 48.5 kg    Examination:  General exam: Uncomfortable due to abdominal distention Respiratory system: Clear to auscultation. Respiratory effort normal. Cardiovascular system: S1 & S2 heard, RRR. No JVD, murmurs, rubs, gallops or  clicks. No pedal edema. Gastrointestinal system: Abdomen is distended, soft and nontender. No organomegaly or masses felt.  Decreased bowel sounds. Central nervous system: Drowsy and nonverbal. No focal neurological deficits. Extremities: Symmetric  Skin: No rashes, lesions or ulcers     Data Reviewed: I have personally reviewed following labs and imaging studies  CBC: Recent Labs  Lab 08/21/20 1746 08/22/20 0801  WBC 13.5* 13.7*  HGB 13.7 13.4  HCT 41.4 40.0  MCV 89.4 89.1  PLT 312 295   Basic Metabolic Panel: Recent Labs  Lab 08/21/20 1746 08/21/20 2159 08/22/20 0719  NA 143  --  145  K 2.8*  --  3.4*  CL 99  --  108  CO2 27  --  25  GLUCOSE 136*  --   161*  BUN 20  --  14  CREATININE 0.88  --  0.71  CALCIUM 9.1  --  8.6*  MG  --  1.7  --    GFR: Estimated Creatinine Clearance: 89.3 mL/min (by C-G formula based on SCr of 0.71 mg/dL). Liver Function Tests: Recent Labs  Lab 08/21/20 1746  AST 30  ALT 48*  ALKPHOS 97  BILITOT 1.1  PROT 8.5*  ALBUMIN 4.1   Recent Labs  Lab 08/21/20 1746  LIPASE 18   No results for input(s): AMMONIA in the last 168 hours. Coagulation Profile: No results for input(s): INR, PROTIME in the last 168 hours. Cardiac Enzymes: No results for input(s): CKTOTAL, CKMB, CKMBINDEX, TROPONINI in the last 168 hours. BNP (last 3 results) No results for input(s): PROBNP in the last 8760 hours. HbA1C: No results for input(s): HGBA1C in the last 72 hours. CBG: No results for input(s): GLUCAP in the last 168 hours. Lipid Profile: No results for input(s): CHOL, HDL, LDLCALC, TRIG, CHOLHDL, LDLDIRECT in the last 72 hours. Thyroid Function Tests: No results for input(s): TSH, T4TOTAL, FREET4, T3FREE, THYROIDAB in the last 72 hours. Anemia Panel: No results for input(s): VITAMINB12, FOLATE, FERRITIN, TIBC, IRON, RETICCTPCT in the last 72 hours. Sepsis Labs: Recent Labs  Lab 08/21/20 1919  LATICACIDVEN 1.8    Recent Results (from the past 240 hour(s))  Resp Panel by RT-PCR (Flu A&B, Covid) Nasopharyngeal Swab     Status: None   Collection Time: 08/21/20  9:33 PM   Specimen: Nasopharyngeal Swab; Nasopharyngeal(NP) swabs in vial transport medium  Result Value Ref Range Status   SARS Coronavirus 2 by RT PCR NEGATIVE NEGATIVE Final    Comment: (NOTE) SARS-CoV-2 target nucleic acids are NOT DETECTED.  The SARS-CoV-2 RNA is generally detectable in upper respiratory specimens during the acute phase of infection. The lowest concentration of SARS-CoV-2 viral copies this assay can detect is 138 copies/mL. A negative result does not preclude SARS-Cov-2 infection and should not be used as the sole basis for  treatment or other patient management decisions. A negative result may occur with  improper specimen collection/handling, submission of specimen other than nasopharyngeal swab, presence of viral mutation(s) within the areas targeted by this assay, and inadequate number of viral copies(<138 copies/mL). A negative result must be combined with clinical observations, patient history, and epidemiological information. The expected result is Negative.  Fact Sheet for Patients:  BloggerCourse.comhttps://www.fda.gov/media/152166/download  Fact Sheet for Healthcare Providers:  SeriousBroker.ithttps://www.fda.gov/media/152162/download  This test is no t yet approved or cleared by the [place] FDA and  has been authorized for detection and/or diagnosis of SARS-CoV-2 by FDA under an Emergency Use Authorization (EUA). This EUA will remain  in effect (meaning  this test can be used) for the duration of the COVID-19 declaration under Section 564(b)(1) of the Act, 21 U.S.C.section 360bbb-3(b)(1), unless the authorization is terminated  or revoked sooner.       Influenza A by PCR NEGATIVE NEGATIVE Final   Influenza B by PCR NEGATIVE NEGATIVE Final    Comment: (NOTE) The Xpert Xpress SARS-CoV-2/FLU/RSV plus assay is intended as an aid in the diagnosis of influenza from Nasopharyngeal swab specimens and should not be used as a sole basis for treatment. Nasal washings and aspirates are unacceptable for Xpert Xpress SARS-CoV-2/FLU/RSV testing.  Fact Sheet for Patients: BloggerCourse.com  Fact Sheet for Healthcare Providers: SeriousBroker.it  This test is not yet approved or cleared by the [place] FDA and has been authorized for detection and/or diagnosis of SARS-CoV-2 by FDA under an Emergency Use Authorization (EUA). This EUA will remain in effect (meaning this test can be used) for the duration of the COVID-19 declaration under Section 564(b)(1) of the Act, 21  U.S.C. section 360bbb-3(b)(1), unless the authorization is terminated or revoked.  Performed at [facility], [address] [place], [place] [address]          Radiology Studies: CT ABDOMEN PELVIS W CONTRAST  Result Date: 08/21/2020 CLINICAL DATA:  Abdominal distension and constipation, initial encounter EXAM: CT ABDOMEN AND PELVIS WITH CONTRAST TECHNIQUE: Multidetector CT imaging of the abdomen and pelvis was performed using the standard protocol following bolus administration of intravenous contrast. CONTRAST:  79mL OMNIPAQUE IOHEXOL 300 MG/ML  SOLN COMPARISON:  04/18/2020 FINDINGS: Lower chest: No acute abnormality. Hepatobiliary: No focal liver abnormality is seen. No gallstones, gallbladder wall thickening, or biliary dilatation. Pancreas: Unremarkable. No pancreatic ductal dilatation or surrounding inflammatory changes. Spleen: Normal in size without focal abnormality. Adrenals/Urinary Tract: Adrenal glands are within normal limits. The kidneys demonstrate a normal enhancement pattern bilaterally. No obstructive changes are seen. The bladder is partially distended. Stomach/Bowel: Significant gaseous distension of the colon consistent with a colonic ileus. No definitive obstructive lesion is seen. Air distended small bowel is noted as well also likely related to the underlying ileus. Fluid and fecal material is noted within the rectum although no definitive impaction is seen. Stomach is decompressed. Vascular/Lymphatic: No significant vascular findings are present. No enlarged abdominal or pelvic lymph nodes. Reproductive: Prostate is unremarkable. Other: No abdominal wall hernia or abnormality. No abdominopelvic ascites. Musculoskeletal: No acute or significant osseous findings. IMPRESSION: Diffuse distension of the colon with gas consistent with a generalized colonic ileus. No definitive obstructing lesion is seen. Fluid and fecal material is noted within the rectum although no  definitive impaction is noted. No other focal abnormality is seen. Electronically Signed   By: Alcide Clever M.D.   On: 08/21/2020 20:37   DG Abd Portable 2 Views  Result Date: 08/21/2020 CLINICAL DATA:  34 year old male with constipation and abdominal distension. EXAM: PORTABLE ABDOMEN - 2 VIEW COMPARISON:  Abdominal radiograph dated 04/17/2020. CT dated 04/18/2020. FINDINGS: There is diffuse gaseous distension of redundant colon. Large amount of fecal matter noted in the proximal colon. No definite small bowel obstruction. Evaluation however is limited due to diffuse colonic distension. There is superior distension of the sigmoid to the left upper abdomen. Sigmoid volvulus is not excluded. CT may provide better evaluation if clinically indicated. No obvious free air. No radiopaque calculi. The osseous structures are intact. IMPRESSION: Diffuse gaseous distension of the colon, possibly chronic and representing adynamic ileus. Sigmoid volvulus is not excluded. CT may provide better evaluation if clinically  indicated. Electronically Signed   By: Elgie Collard M.D.   On: 08/21/2020 19:22        Scheduled Meds: . enoxaparin (LOVENOX) injection  40 mg Subcutaneous Q24H  . felbamate  900 mg Oral Q12H  . haloperidol  2 mg Oral Daily  . lactulose  20 g Oral BID  . linaclotide  290 mcg Oral Daily  .  morphine injection  1 mg Intravenous Once  . pantoprazole  40 mg Oral BID  . senna  25.8 mg Oral BID   Continuous Infusions: . sodium chloride    . dextrose 5 % and 0.45 % NaCl with KCl 20 mEq/L       LOS: 0 days    Time spent: 28 minutes    Marrion Coy, MD [facility]   To contact the attending provider between 7A-7P or the covering provider during after hours 7P-7A, please log into the web site [email or web address] and access using universal [place] password for that web site. If you do not have the password, please call the hospital operator.  08/22/2020, 1:17 PM

## 2020-08-22 NOTE — Consult Note (Signed)
Patient ID: Jonathan Kirby, male   DOB: 1985-10-27, 34 y.o.   MRN: [id number]  HPI Jonathan Kirby is a 34 y.o. male pain in consultation at the request of Dr. Joylene Igo for large bowel ileus.  He does have a significant history of severe autism and seizure disorder.  He is nonverbal and the history is obtained from his mother that is at the bedside.  There is very involving his care.  He does have chronic history of constipation and multiple GI issues and has been seen by Dr. Maximino Greenland.He has been started on linzess.  Mother took him to the emergency room last night because of abdominal distention and constipation.  He did receive an enema earlier today morning with good results.  Mother reports that his abdomen is less distended.  He is currently in no distress and seems to be very comfortable Other also reports decreased appetite decreased urine output. ET scan personally reviewed showing evidence of generalized colonic ileus without any perforation.  No evidence of mechanical obstruction  CBC shows a slight increase in white count to 13,000 normal creatinine and a slight increase sugar.  He did have hypokalemia yesterday has improved No Prior abdominal operations  HPI  Past Medical History:  Diagnosis Date  . Autism   . GERD (gastroesophageal reflux disease)   . Seizures (HCC)     Past Surgical History:  Procedure Laterality Date  . CIRCUMCISION  1987  . ESOPHAGOGASTRODUODENOSCOPY N/A 04/17/2020   Procedure: ESOPHAGOGASTRODUODENOSCOPY (EGD);  Surgeon: Pasty Spillers, MD;  Location[facility] ENDOSCOPY;  Service: Endoscopy;  Laterality: N/A;    Family History  Problem Relation Age of Onset  . Cancer Maternal Grandmother        Died at 93  . Alzheimer's disease Maternal Grandfather        Died at 46  . Stroke Paternal Grandfather        Died at 50    Social History Social History   Tobacco Use  . Smoking status: Never Smoker  . Smokeless tobacco: Never Used  Substance Use Topics   . Alcohol use: No    Alcohol/week: 0.0 standard drinks  . Drug use: No    No Known Allergies  Current Facility-Administered Medications  Medication Dose Route Frequency Provider Last Rate Last Admin  . 0.9 %  sodium chloride infusion   Intravenous Once Agbata, Tochukwu, MD      . dextrose 5 % and 0.9 % NaCl with KCl 40 mEq/L infusion   Intravenous Continuous Agbata, Tochukwu, MD   Paused at 08/22/20 0230  . enoxaparin (LOVENOX) injection 40 mg  40 mg Subcutaneous Q24H Agbata, Tochukwu, MD   40 mg at 08/21/20 2227  . felbamate (FELBATOL) 600 MG/5ML suspension 900 mg  900 mg Oral Q12H Agbata, Tochukwu, MD      . haloperidol (HALDOL) tablet 2 mg  2 mg Oral Daily Agbata, Tochukwu, MD      . lactulose (CHRONULAC) 10 GM/15ML solution 20 g  20 g Oral BID Marrion Coy, MD      . linaclotide Karlene Einstein) capsule 290 mcg  290 mcg Oral Daily Agbata, Tochukwu, MD      . morphine 2 MG/ML injection 1 mg  1 mg Intravenous Once Agbata, Tochukwu, MD      . ondansetron (ZOFRAN) tablet 4 mg  4 mg Oral Q6H PRN Agbata, Tochukwu, MD       Or  . ondansetron (ZOFRAN) injection 4 mg  4 mg Intravenous Q6H PRN Agbata,  Tochukwu, MD      . pantoprazole (PROTONIX) EC tablet 40 mg  40 mg Oral BID Agbata, Tochukwu, MD      . senna (SENOKOT) tablet 25.8 mg  25.8 mg Oral BID Agbata, Tochukwu, MD       Current Outpatient Medications  Medication Sig Dispense Refill  . felbamate (FELBATOL) 600 MG/5ML suspension Take 7.5 mL by mouth twice daily 474 mL 5  . haloperidol (HALDOL) 2 MG tablet Crush 1 tablet in place in a puree (Patient taking differently: as needed. Crush 1 tablet in place in a puree) 10 tablet 5  . pantoprazole (PROTONIX) 40 MG tablet Take 1 tablet (40 mg total) by mouth 2 (two) times daily. 60 tablet 1  . Plecanatide (TRULANCE) 3 MG TABS Take 3 mg by mouth daily.    . Sennosides 25 MG TABS Take 1 tablet (25 mg total) by mouth in the morning and at bedtime. 120 tablet 3  . sodium phosphate (FLEET) 7-19 GM/118ML  ENEM Place 1 enema rectally every three (3) days as needed. If no bowel movement    . linaclotide (LINZESS) 290 MCG CAPS capsule Take 1 capsule (290 mcg total) by mouth daily. (Patient not taking: Reported on 08/21/2020) 30 capsule 3     Review of Systems Full ROS  was asked and was negative except for the information on the HPI  Physical Exam Blood pressure (!) 110/58, pulse (!) 105, temperature 98.7 F (37.1 C), temperature source Oral, resp. rate 17, height 5\' 5"  (1.651 m), weight 48.5 kg, SpO2 100 %. CONSTITUTIONAL: He is in no acute distress.  Awake and alert EYES: Pupils are equal, round, Sclera are non-icteric. EARS, NOSE, MOUTH AND THROAT: He is wearing a mask. Hearing is intact to voice. LYMPH NODES:  Lymph nodes in the neck are normal. RESPIRATORY:  Lungs are clear. There is normal respiratory effort, with equal breath sounds bilaterally, and without pathologic use of accessory muscles. CARDIOVASCULAR: Heart is regular without murmurs, gallops, or rubs. GI: The abdomen is  soft, nontender, and mildly distended. There are no palpable masses. There is no hepatosplenomegaly. There are normal bowel sounds in all quadrants.  No peritonitis GU: Rectal deferred.   MUSCULOSKELETAL: Normal muscle strength and tone. No cyanosis or edema.   SKIN: Turgor is good and there are no pathologic skin lesions or ulcers. NEUROLOGIC: Motor and sensation is grossly normal. Cranial nerves are grossly intact. PSYCH: He is nonverbal but is awake and alert and in good spirits.  Data Reviewed  I have personally reviewed the patient's imaging, laboratory findings and medical records.    Assessment/Plan 34 year old male with significant GI issues now presents with colonic ileus/ Ogilvie's syndrome.  His abdomen is otherwise soft and there is no evidence of complications. ReCommend medical management of Ogilvie's avoiding narcotics, optimizing hydration and electrolyte abnormalities and evaluating the  possibility of neostigmine if GI seems to agree with this. If he fails neostigmine may also consider coloscopic decompression. Surgical intervention is only reserved for perforation or imminent perforation. Please call 20 if any surgical issues arises  [place], MD FACS General Surgeon 08/22/2020, 9:52 AM

## 2020-08-23 ENCOUNTER — Inpatient Hospital Stay: Payer: Medicaid Other

## 2020-08-23 LAB — BASIC METABOLIC PANEL
Anion gap: 9 (ref 5–15)
BUN: 17 mg/dL (ref 6–20)
CO2: 26 mmol/L (ref 22–32)
Calcium: 8.4 mg/dL — ABNORMAL LOW (ref 8.9–10.3)
Chloride: 111 mmol/L (ref 98–111)
Creatinine, Ser: 0.75 mg/dL (ref 0.61–1.24)
GFR, Estimated: >60 mL/min (ref >60)
Glucose, Bld: 98 mg/dL (ref 70–99)
Potassium: 3.7 mmol/L (ref 3.5–5.1)
Sodium: 146 mmol/L — ABNORMAL HIGH (ref 135–145)

## 2020-08-23 LAB — CBC WITH DIFFERENTIAL/PLATELET
Abs Immature Granulocytes: 0.04 10*3/uL (ref 0.00–0.07)
Basophils Absolute: 0.1 10*3/uL (ref 0.0–0.1)
Basophils Relative: 1 %
Eosinophils Absolute: 0.5 10*3/uL (ref 0.0–0.5)
Eosinophils Relative: 5 %
HCT: 35.1 % — ABNORMAL LOW (ref 39.0–52.0)
Hemoglobin: 11.8 g/dL — ABNORMAL LOW (ref 13.0–17.0)
Immature Granulocytes: 0 %
Lymphocytes Relative: 17 %
Lymphs Abs: 1.6 10*3/uL (ref 0.7–4.0)
MCH: 29.9 pg (ref 26.0–34.0)
MCHC: 33.6 g/dL (ref 30.0–36.0)
MCV: 89.1 fL (ref 80.0–100.0)
Monocytes Absolute: 0.9 10*3/uL (ref 0.1–1.0)
Monocytes Relative: 10 %
Neutro Abs: 6.4 10*3/uL (ref 1.7–7.7)
Neutrophils Relative %: 67 %
Platelets: 259 10*3/uL (ref 150–400)
RBC: 3.94 MIL/uL — ABNORMAL LOW (ref 4.22–5.81)
RDW: 15.6 % — ABNORMAL HIGH (ref 11.5–15.5)
WBC: 9.5 10*3/uL (ref 4.0–10.5)
nRBC: 0 % (ref 0.0–0.2)

## 2020-08-23 LAB — MAGNESIUM: Magnesium: 2.1 mg/dL (ref 1.7–2.4)

## 2020-08-23 MED ORDER — DEXTROSE-NACL 5-0.45 % IV SOLN: INTRAVENOUS | Status: DC

## 2020-08-23 NOTE — Progress Notes (Signed)
Jonathan Minium, MD [facility]   [address], [address] [place], [place] [address] Phone: [phone] Fax : [phone]   Subjective: The patient does not appear to be in any distress.  The patient's mother states that his abdomen is not as distended as she seen in the past.  There is no report of any abdominal pain.  Despite multiple interventions there has been very little passage of stool or gas.  The patient is on multiple laxatives at home.  The patient's mother states he is taking Trulance, Senokot, Protonix and has tried Linzess.  The patient's KUB this morning did not show any stool but a colonic and small bowel ileus.   Objective: Vital signs in last 24 hours: Vitals:   08/22/20 2311 08/23/20 0347 08/23/20 0746 08/23/20 1146  BP: 101/70 122/90 106/73 94/65  Pulse: 91 94 92 73  Resp: 16 16 18 16   Temp: 97.8 F (36.6 C) 97.8 F (36.6 C) 99.2 F (37.3 C) 99.2 F (37.3 C)  TempSrc: Oral Oral Oral   SpO2: 99% 99% 100% 99%  Weight:      Height:       Weight change:   Intake/Output Summary (Last 24 hours) at 08/23/2020 1208 Last data filed at 08/23/2020 0347 Gross per 24 hour  Intake --  Output 0 ml  Net 0 ml     Exam: Heart:: Regular rate and rhythm, S1S2 present or without murmur or extra heart sounds Abdomen: Soft and not tense slightly distended with decreased bowel sounds.   Lab Results: @LABTEST2 @ Micro Results: Recent Results (from the past 240 hour(s))  Resp Panel by RT-PCR (Flu A&B, Covid) Nasopharyngeal Swab     Status: None   Collection Time: 08/21/20  9:33 PM   Specimen: Nasopharyngeal Swab; Nasopharyngeal(NP) swabs in vial transport medium  Result Value Ref Range Status   SARS Coronavirus 2 by RT PCR NEGATIVE NEGATIVE Final    Comment: (NOTE) SARS-CoV-2 target nucleic acids are NOT DETECTED.  The SARS-CoV-2 RNA is generally detectable in upper respiratory specimens during the acute phase of infection. The lowest concentration of SARS-CoV-2 viral copies  this assay can detect is 138 copies/mL. A negative result does not preclude SARS-Cov-2 infection and should not be used as the sole basis for treatment or other patient management decisions. A negative result may occur with  improper specimen collection/handling, submission of specimen other than nasopharyngeal swab, presence of viral mutation(s) within the areas targeted by this assay, and inadequate number of viral copies(<138 copies/mL). A negative result must be combined with clinical observations, patient history, and epidemiological information. The expected result is Negative.  Fact Sheet for Patients:   Fact Sheet for Healthcare Providers:  14/02/21  This test is no t yet approved or cleared by the [employer] FDA and  has been authorized for detection and/or diagnosis of SARS-CoV-2 by FDA under an Emergency Use Authorization (EUA). This EUA will remain  in effect (meaning this test can be used) for the duration of the COVID-19 declaration under Section 564(b)(1) of the Act, 21 U.S.C.section 360bbb-3(b)(1), unless the authorization is terminated  or revoked sooner.       Influenza A by PCR NEGATIVE NEGATIVE Final   Influenza B by PCR NEGATIVE NEGATIVE Final    Comment: (NOTE) The Xpert Xpress SARS-CoV-2/FLU/RSV plus assay is intended as an aid in the diagnosis of influenza from Nasopharyngeal swab specimens and should not be used as a sole basis for treatment. Nasal washings and aspirates are unacceptable for  Xpert Xpress SARS-CoV-2/FLU/RSV testing.  Fact Sheet for Patients: BloggerCourse.com  Fact Sheet for Healthcare Providers: SeriousBroker.it  This test is not yet approved or cleared by the [place] FDA and has been authorized for detection and/or diagnosis of SARS-CoV-2 by FDA under an Emergency Use Authorization (EUA). This EUA  will remain in effect (meaning this test can be used) for the duration of the COVID-19 declaration under Section 564(b)(1) of the Act, 21 U.S.C. section 360bbb-3(b)(1), unless the authorization is terminated or revoked.  Performed at [facility], [address] [place], [place] [address]    Studies/Results: [place] Abd 1 View  Result Date: 08/23/2020 CLINICAL DATA:  Abdominal distension and decreased intake EXAM: ABDOMEN - 1 VIEW COMPARISON:  08/21/2020 FINDINGS: Diffuse large and small bowel gas is noted. The overall appearance is similar to that seen on the prior exam suggestive of diffuse colonic ileus. No free air is seen. No mass lesion is seen. No bony abnormality is noted. No definitive volvulus is noted. IMPRESSION: Changes consistent with colonic ileus stable from 2 days previous. Electronically Signed   By: Alcide Clever M.D.   On: 08/23/2020 10:04   CT ABDOMEN PELVIS W CONTRAST  Result Date: 08/21/2020 CLINICAL DATA:  Abdominal distension and constipation, initial encounter EXAM: CT ABDOMEN AND PELVIS WITH CONTRAST TECHNIQUE: Multidetector CT imaging of the abdomen and pelvis was performed using the standard protocol following bolus administration of intravenous contrast. CONTRAST:  55mL OMNIPAQUE IOHEXOL 300 MG/ML  SOLN COMPARISON:  04/18/2020 FINDINGS: Lower chest: No acute abnormality. Hepatobiliary: No focal liver abnormality is seen. No gallstones, gallbladder wall thickening, or biliary dilatation. Pancreas: Unremarkable. No pancreatic ductal dilatation or surrounding inflammatory changes. Spleen: Normal in size without focal abnormality. Adrenals/Urinary Tract: Adrenal glands are within normal limits. The kidneys demonstrate a normal enhancement pattern bilaterally. No obstructive changes are seen. The bladder is partially distended. Stomach/Bowel: Significant gaseous distension of the colon consistent with a colonic ileus. No definitive obstructive lesion is seen. Air  distended small bowel is noted as well also likely related to the underlying ileus. Fluid and fecal material is noted within the rectum although no definitive impaction is seen. Stomach is decompressed. Vascular/Lymphatic: No significant vascular findings are present. No enlarged abdominal or pelvic lymph nodes. Reproductive: Prostate is unremarkable. Other: No abdominal wall hernia or abnormality. No abdominopelvic ascites. Musculoskeletal: No acute or significant osseous findings. IMPRESSION: Diffuse distension of the colon with gas consistent with a generalized colonic ileus. No definitive obstructing lesion is seen. Fluid and fecal material is noted within the rectum although no definitive impaction is noted. No other focal abnormality is seen. Electronically Signed   By: Alcide Clever M.D.   On: 08/21/2020 20:37   DG Abd Portable 2 Views  Result Date: 08/21/2020 CLINICAL DATA:  34 year old male with constipation and abdominal distension. EXAM: PORTABLE ABDOMEN - 2 VIEW COMPARISON:  Abdominal radiograph dated 04/17/2020. CT dated 04/18/2020. FINDINGS: There is diffuse gaseous distension of redundant colon. Large amount of fecal matter noted in the proximal colon. No definite small bowel obstruction. Evaluation however is limited due to diffuse colonic distension. There is superior distension of the sigmoid to the left upper abdomen. Sigmoid volvulus is not excluded. CT may provide better evaluation if clinically indicated. No obvious free air. No radiopaque calculi. The osseous structures are intact. IMPRESSION: Diffuse gaseous distension of the colon, possibly chronic and representing adynamic ileus. Sigmoid volvulus is not excluded. CT may provide better evaluation if clinically indicated. Electronically Signed  By: Elgie Collard M.D.   On: 08/21/2020 19:22   Medications: I have reviewed the patient's current medications. Scheduled Meds: . enoxaparin (LOVENOX) injection  40 mg Subcutaneous Q24H  .  felbamate  900 mg Oral Q12H  . haloperidol  2 mg Oral Daily  . lactulose  20 g Oral BID  . linaclotide  290 mcg Oral Daily  .  morphine injection  1 mg Intravenous Once  . pantoprazole sodium  40 mg Oral BID  . senna  25.8 mg Oral BID   Continuous Infusions: . sodium chloride    . dextrose 5 % and 0.45% NaCl 125 mL/hr at 08/23/20 1037   PRN Meds:.ketorolac, ondansetron **OR** ondansetron (ZOFRAN) IV   Assessment: Principal Problem:   Abdominal pain Active Problems:   Autism spectrum disorder with accompanying language impairment and intellectual disability, requiring very substantial support   Constipation   Decreased oral intake   Hypokalemia   Hypomagnesemia   Ogilvie syndrome    Plan: The patient has colonic ileus with small bowel distention and colonic distention with decreased bowel sounds.  The was given lactulose which she kept down but I was told that the patient spit up the Senokot.  The patient will have a rectal tube put in.  This should help decompress the patient's colon and hopefully help with his ileus.  The patient's mother has multiple questions that were answered.   LOS: 1 day   Sherlyn Hay 08/23/2020, 12:08 PM Pager [phone] 7am-5pm  Check AMION for 5pm -7am coverage and on weekends

## 2020-08-23 NOTE — Progress Notes (Signed)
PROGRESS NOTE    Jonathan Kirby  YIF:[id number] DOB: 07-10-1986 DOA: 08/21/2020 PCP: Almetta Lovely, [facility]   Chief complaint. Severe constipation Brief Narrative:  Jonathan Kirby a 34 y.o.malewith medical history significantforsevere autism, nonverbal at baseline, who was brought in by his parentsfor evaluation of abdominal distention and poor oral intake for couple of days. His mother states that his last bowel movement was a small amount on Sunday 11/28despite giving him laxatives and enemas. She got concerned because patient's abdomen is very distended and he appears uncomfortable. He also has very poor oral intake and decreased urine output. CT scan abdomen/pelvis with contrast showed severe dilated colon.  Patient has been seen by general surgery, no surgical indication.    Assessment & Plan:   Principal Problem:   Abdominal pain Active Problems:   Autism spectrum disorder with accompanying language impairment and intellectual disability, requiring very substantial support   Constipation   Decreased oral intake   Hypokalemia   Hypomagnesemia   Ogilvie syndrome  #1. Colonic ileus with Ogilvie syndrome. Appreciate GI consult. Continue IV rehydration. Patient had decreased urine output, increase IV fluids. Scheduled warm water enema twice a day today. Patient will be seen by GI again. Continue n.p.o. due to abdominal distention.  #2. Mild hyponatremia. Continue half-normal saline.  3. Autism disorder with language impairment and intellectual disability.   DVT prophylaxis: Lovenox Code Status: Full Family Communication: Discussed with the parents. Disposition Plan:  .   Status is: Inpatient  Remains inpatient appropriate because:Inpatient level of care appropriate due to severity of illness   Dispo: The patient is from: Home              Anticipated d/c is to: Home              Anticipated d/c date is: 2 days              Patient currently is not  medically stable to d/c.        I/O last 3 completed shifts: In: 1148 [IV Piggyback:1148] Out: 0  No intake/output data recorded.     Consultants:   GI, GS  Procedures: None  Antimicrobials: None Subjective: Patient abdominal distention slightly better. I spoke with patient parents, they were here for the entire time, patient did not have any bowel movement. But he does not have any nausea vomiting. Does not seem to have any pain. No fever or chills. No dysuria hematuria  Objective: Vitals:   08/22/20 1931 08/22/20 2311 08/23/20 0347 08/23/20 0746  BP: 109/81 101/70 122/90 106/73  Pulse: (!) 103 91 94 92  Resp: 16 16 16 18   Temp: (!) 97.4 F (36.3 C) 97.8 F (36.6 C) 97.8 F (36.6 C) 99.2 F (37.3 C)  TempSrc: Oral Oral Oral Oral  SpO2: 99% 99% 99% 100%  Weight:      Height:        Intake/Output Summary (Last 24 hours) at 08/23/2020 0927 Last data filed at 08/23/2020 0347 Gross per 24 hour  Intake --  Output 0 ml  Net 0 ml   Filed Weights   08/21/20 1742  Weight: 48.5 kg    Examination:  General exam: Appears calm and comfortable  Respiratory system: Clear to auscultation. Respiratory effort normal. Cardiovascular system: S1 & S2 heard, RRR. No JVD, murmurs, rubs, gallops or clicks. No pedal edema. Gastrointestinal system: Abdomen is distended, soft and nontender. No organomegaly or masses felt. Decreased bowel sounds Central nervous system: Awake and aphasic. No  focal neurological deficits. Extremities: Symmetric Skin: No rashes, lesions or ulcers     Data Reviewed: I have personally reviewed following labs and imaging studies  CBC: Recent Labs  Lab 08/21/20 1746 08/22/20 0801 08/23/20 0343  WBC 13.5* 13.7* 9.5  NEUTROABS  --   --  6.4  HGB 13.7 13.4 11.8*  HCT 41.4 40.0 35.1*  MCV 89.4 89.1 89.1  PLT 312 295 259   Basic Metabolic Panel: Recent Labs  Lab 08/21/20 1746 08/21/20 2159 08/22/20 0719 08/23/20 0343  NA 143  --  145  146*  K 2.8*  --  3.4* 3.7  CL 99  --  108 111  CO2 27  --  25 26  GLUCOSE 136*  --  161* 98  BUN 20  --  14 17  CREATININE 0.88  --  0.71 0.75  CALCIUM 9.1  --  8.6* 8.4*  MG  --  1.7  --  2.1   GFR: Estimated Creatinine Clearance: 89.3 mL/min (by C-G formula based on SCr of 0.75 mg/dL). Liver Function Tests: Recent Labs  Lab 08/21/20 1746  AST 30  ALT 48*  ALKPHOS 97  BILITOT 1.1  PROT 8.5*  ALBUMIN 4.1   Recent Labs  Lab 08/21/20 1746  LIPASE 18   No results for input(s): AMMONIA in the last 168 hours. Coagulation Profile: No results for input(s): INR, PROTIME in the last 168 hours. Cardiac Enzymes: No results for input(s): CKTOTAL, CKMB, CKMBINDEX, TROPONINI in the last 168 hours. BNP (last 3 results) No results for input(s): PROBNP in the last 8760 hours. HbA1C: No results for input(s): HGBA1C in the last 72 hours. CBG: No results for input(s): GLUCAP in the last 168 hours. Lipid Profile: No results for input(s): CHOL, HDL, LDLCALC, TRIG, CHOLHDL, LDLDIRECT in the last 72 hours. Thyroid Function Tests: No results for input(s): TSH, T4TOTAL, FREET4, T3FREE, THYROIDAB in the last 72 hours. Anemia Panel: No results for input(s): VITAMINB12, FOLATE, FERRITIN, TIBC, IRON, RETICCTPCT in the last 72 hours. Sepsis Labs: Recent Labs  Lab 08/21/20 1919  LATICACIDVEN 1.8    Recent Results (from the past 240 hour(s))  Resp Panel by RT-PCR (Flu A&B, Covid) Nasopharyngeal Swab     Status: None   Collection Time: 08/21/20  9:33 PM   Specimen: Nasopharyngeal Swab; Nasopharyngeal(NP) swabs in vial transport medium  Result Value Ref Range Status   SARS Coronavirus 2 by RT PCR NEGATIVE NEGATIVE Final    Comment: (NOTE) SARS-CoV-2 target nucleic acids are NOT DETECTED.  The SARS-CoV-2 RNA is generally detectable in upper respiratory specimens during the acute phase of infection. The lowest concentration of SARS-CoV-2 viral copies this assay can detect is 138  copies/mL. A negative result does not preclude SARS-Cov-2 infection and should not be used as the sole basis for treatment or other patient management decisions. A negative result may occur with  improper specimen collection/handling, submission of specimen other than nasopharyngeal swab, presence of viral mutation(s) within the areas targeted by this assay, and inadequate number of viral copies(<138 copies/mL). A negative result must be combined with clinical observations, patient history, and epidemiological information. The expected result is Negative.  Fact Sheet for Patients:  BloggerCourse.comhttps://www.fda.gov/media/152166/download  Fact Sheet for Healthcare Providers:  SeriousBroker.ithttps://www.fda.gov/media/152162/download  This test is no t yet approved or cleared by the [place] FDA and  has been authorized for detection and/or diagnosis of SARS-CoV-2 by FDA under an Emergency Use Authorization (EUA). This EUA will remain  in effect (meaning this test  can be used) for the duration of the COVID-19 declaration under Section 564(b)(1) of the Act, 21 U.S.C.section 360bbb-3(b)(1), unless the authorization is terminated  or revoked sooner.       Influenza A by PCR NEGATIVE NEGATIVE Final   Influenza B by PCR NEGATIVE NEGATIVE Final    Comment: (NOTE) The Xpert Xpress SARS-CoV-2/FLU/RSV plus assay is intended as an aid in the diagnosis of influenza from Nasopharyngeal swab specimens and should not be used as a sole basis for treatment. Nasal washings and aspirates are unacceptable for Xpert Xpress SARS-CoV-2/FLU/RSV testing.  Fact Sheet for Patients: BloggerCourse.com  Fact Sheet for Healthcare Providers: SeriousBroker.it  This test is not yet approved or cleared by the [place] FDA and has been authorized for detection and/or diagnosis of SARS-CoV-2 by FDA under an Emergency Use Authorization (EUA). This EUA will remain in effect (meaning  this test can be used) for the duration of the COVID-19 declaration under Section 564(b)(1) of the Act, 21 U.S.C. section 360bbb-3(b)(1), unless the authorization is terminated or revoked.  Performed at [facility], [address] [place], [place] [address]          Radiology Studies: CT ABDOMEN PELVIS W CONTRAST  Result Date: 08/21/2020 CLINICAL DATA:  Abdominal distension and constipation, initial encounter EXAM: CT ABDOMEN AND PELVIS WITH CONTRAST TECHNIQUE: Multidetector CT imaging of the abdomen and pelvis was performed using the standard protocol following bolus administration of intravenous contrast. CONTRAST:  24mL OMNIPAQUE IOHEXOL 300 MG/ML  SOLN COMPARISON:  04/18/2020 FINDINGS: Lower chest: No acute abnormality. Hepatobiliary: No focal liver abnormality is seen. No gallstones, gallbladder wall thickening, or biliary dilatation. Pancreas: Unremarkable. No pancreatic ductal dilatation or surrounding inflammatory changes. Spleen: Normal in size without focal abnormality. Adrenals/Urinary Tract: Adrenal glands are within normal limits. The kidneys demonstrate a normal enhancement pattern bilaterally. No obstructive changes are seen. The bladder is partially distended. Stomach/Bowel: Significant gaseous distension of the colon consistent with a colonic ileus. No definitive obstructive lesion is seen. Air distended small bowel is noted as well also likely related to the underlying ileus. Fluid and fecal material is noted within the rectum although no definitive impaction is seen. Stomach is decompressed. Vascular/Lymphatic: No significant vascular findings are present. No enlarged abdominal or pelvic lymph nodes. Reproductive: Prostate is unremarkable. Other: No abdominal wall hernia or abnormality. No abdominopelvic ascites. Musculoskeletal: No acute or significant osseous findings. IMPRESSION: Diffuse distension of the colon with gas consistent with a generalized colonic ileus.  No definitive obstructing lesion is seen. Fluid and fecal material is noted within the rectum although no definitive impaction is noted. No other focal abnormality is seen. Electronically Signed   By: Alcide Clever M.D.   On: 08/21/2020 20:37   DG Abd Portable 2 Views  Result Date: 08/21/2020 CLINICAL DATA:  34 year old male with constipation and abdominal distension. EXAM: PORTABLE ABDOMEN - 2 VIEW COMPARISON:  Abdominal radiograph dated 04/17/2020. CT dated 04/18/2020. FINDINGS: There is diffuse gaseous distension of redundant colon. Large amount of fecal matter noted in the proximal colon. No definite small bowel obstruction. Evaluation however is limited due to diffuse colonic distension. There is superior distension of the sigmoid to the left upper abdomen. Sigmoid volvulus is not excluded. CT may provide better evaluation if clinically indicated. No obvious free air. No radiopaque calculi. The osseous structures are intact. IMPRESSION: Diffuse gaseous distension of the colon, possibly chronic and representing adynamic ileus. Sigmoid volvulus is not excluded. CT may provide better evaluation if clinically indicated. Electronically  Signed   By: Elgie Collard M.D.   On: 08/21/2020 19:22        Scheduled Meds: . enoxaparin (LOVENOX) injection  40 mg Subcutaneous Q24H  . felbamate  900 mg Oral Q12H  . haloperidol  2 mg Oral Daily  . lactulose  20 g Oral BID  . linaclotide  290 mcg Oral Daily  .  morphine injection  1 mg Intravenous Once  . pantoprazole sodium  40 mg Oral BID  . senna  25.8 mg Oral BID   Continuous Infusions: . sodium chloride    . dextrose 5 % and 0.45% NaCl       LOS: 1 day    Time spent: 26 minutes    Marrion Coy, MD [facility]   To contact the attending provider between 7A-7P or the covering provider during after hours 7P-7A, please log into the web site [email or web address] and access using universal [place] password for that web site. If you do not  have the password, please call the hospital operator.  08/23/2020, 9:27 AM

## 2020-08-23 NOTE — Progress Notes (Signed)
Patient has been resting/sleeping all night without interruptions from abdominal discomfort. Both parents remain at bedside for support. RN offered emotional support as needed. Frequent rounding made to assess pt condition. He remains in stable condition. Breathing even and unlabored. Parents encouraged to use call bell as needed.

## 2020-08-24 DIAGNOSIS — G9341 Metabolic encephalopathy: Secondary | ICD-10-CM

## 2020-08-24 LAB — BASIC METABOLIC PANEL
Anion gap: 7 (ref 5–15)
BUN: 13 mg/dL (ref 6–20)
CO2: 26 mmol/L (ref 22–32)
Calcium: 8 mg/dL — ABNORMAL LOW (ref 8.9–10.3)
Chloride: 109 mmol/L (ref 98–111)
Creatinine, Ser: 0.72 mg/dL (ref 0.61–1.24)
GFR, Estimated: >60 mL/min (ref >60)
Glucose, Bld: 104 mg/dL — ABNORMAL HIGH (ref 70–99)
Potassium: 3.3 mmol/L — ABNORMAL LOW (ref 3.5–5.1)
Sodium: 142 mmol/L (ref 135–145)

## 2020-08-24 LAB — CBC WITH DIFFERENTIAL/PLATELET
Abs Immature Granulocytes: 0.01 10*3/uL (ref 0.00–0.07)
Basophils Absolute: 0 10*3/uL (ref 0.0–0.1)
Basophils Relative: 0 %
Eosinophils Absolute: 0.3 10*3/uL (ref 0.0–0.5)
Eosinophils Relative: 7 %
HCT: 31.9 % — ABNORMAL LOW (ref 39.0–52.0)
Hemoglobin: 10.3 g/dL — ABNORMAL LOW (ref 13.0–17.0)
Immature Granulocytes: 0 %
Lymphocytes Relative: 35 %
Lymphs Abs: 1.7 10*3/uL (ref 0.7–4.0)
MCH: 29.6 pg (ref 26.0–34.0)
MCHC: 32.3 g/dL (ref 30.0–36.0)
MCV: 91.7 fL (ref 80.0–100.0)
Monocytes Absolute: 0.5 10*3/uL (ref 0.1–1.0)
Monocytes Relative: 11 %
Neutro Abs: 2.2 10*3/uL (ref 1.7–7.7)
Neutrophils Relative %: 47 %
Platelets: 237 10*3/uL (ref 150–400)
RBC: 3.48 MIL/uL — ABNORMAL LOW (ref 4.22–5.81)
RDW: 15.4 % (ref 11.5–15.5)
WBC: 4.7 10*3/uL (ref 4.0–10.5)
nRBC: 0 % (ref 0.0–0.2)

## 2020-08-24 LAB — URINALYSIS, COMPLETE (UACMP) WITH MICROSCOPIC
Bilirubin Urine: NEGATIVE
Glucose, UA: NEGATIVE mg/dL
Hgb urine dipstick: NEGATIVE
Ketones, ur: 5 mg/dL — AB
Leukocytes,Ua: NEGATIVE
Nitrite: NEGATIVE
Protein, ur: NEGATIVE mg/dL
Specific Gravity, Urine: 1.023 (ref 1.005–1.030)
pH: 5 (ref 5.0–8.0)

## 2020-08-24 LAB — MAGNESIUM: Magnesium: 1.7 mg/dL (ref 1.7–2.4)

## 2020-08-24 MED ORDER — POTASSIUM CHLORIDE 10 MEQ/100ML IV SOLN
10.0000 meq | INTRAVENOUS | Status: AC
  Administered 2020-08-24 (×2): 10 meq via INTRAVENOUS
  Filled 2020-08-24: qty 100

## 2020-08-24 NOTE — Progress Notes (Signed)
Pt has been somnolent for majority of shift. Mom states this is not typical for him. He is responsive when awakened. Will bladder scan d/t lack of urinating. Abdomen soft; bladder slightly distended. Rectal tube in place. No BM since 2200. Breathing even and unlabored. Both parents in room for support. Will continue to monitor.

## 2020-08-24 NOTE — Progress Notes (Signed)
Midge Minium, MD [facility]   [address] [place], [place] [address] Phone: [phone] Fax : [phone]   Subjective: Spoke to the patient's family today about the father and mother were both in the room. The patient's abdomen is less distended today. The patient's mother has many questions about his urine output and his inability to keep down his food. He also reports that he is less responsive today.   Objective: Vital signs in last 24 hours: Vitals:   08/24/20 0047 08/24/20 0445 08/24/20 0821 08/24/20 1130  BP: 93/61 98/71 (!) 90/59 107/70  Pulse: (!) 52 63 (!) 56 62  Resp: 15 16 16    Temp: 97.9 F (36.6 C) 97.6 F (36.4 C)  (!) 97.4 F (36.3 C)  TempSrc: Axillary Oral    SpO2: 99% 100% 99% 100%  Weight:      Height:       Weight change:   Intake/Output Summary (Last 24 hours) at 08/24/2020 1441 Last data filed at 08/24/2020 0500 Gross per 24 hour  Intake 1250 ml  Output 650 ml  Net 600 ml    Exam: General: Patient laying in bed no apparent distress Abdomen: soft, nontender, normal bowel sounds   Lab Results: @LABTEST2 @ Micro Results: Recent Results (from the past 240 hour(s))  Resp Panel by RT-PCR (Flu A&B, Covid) Nasopharyngeal Swab     Status: None   Collection Time: 08/21/20  9:33 PM   Specimen: Nasopharyngeal Swab; Nasopharyngeal(NP) swabs in vial transport medium  Result Value Ref Range Status   SARS Coronavirus 2 by RT PCR NEGATIVE NEGATIVE Final    Comment: (NOTE) SARS-CoV-2 target nucleic acids are NOT DETECTED.  The SARS-CoV-2 RNA is generally detectable in upper respiratory specimens during the acute phase of infection. The lowest concentration of SARS-CoV-2 viral copies this assay can detect is 138 copies/mL. A negative result does not preclude SARS-Cov-2 infection and should not be used as the sole basis for treatment or other patient management decisions. A negative result may occur with  improper specimen collection/handling,  submission of specimen other than nasopharyngeal swab, presence of viral mutation(s) within the areas targeted by this assay, and inadequate number of viral copies(<138 copies/mL). A negative result must be combined with clinical observations, patient history, and epidemiological information. The expected result is Negative.  Fact Sheet for Patients:   Fact Sheet for Healthcare Providers:  14/02/21  This test is no t yet approved or cleared by the [employer] FDA and  has been authorized for detection and/or diagnosis of SARS-CoV-2 by FDA under an Emergency Use Authorization (EUA). This EUA will remain  in effect (meaning this test can be used) for the duration of the COVID-19 declaration under Section 564(b)(1) of the Act, 21 U.S.C.section 360bbb-3(b)(1), unless the authorization is terminated  or revoked sooner.       Influenza A by PCR NEGATIVE NEGATIVE Final   Influenza B by PCR NEGATIVE NEGATIVE Final    Comment: (NOTE) The Xpert Xpress SARS-CoV-2/FLU/RSV plus assay is intended as an aid in the diagnosis of influenza from Nasopharyngeal swab specimens and should not be used as a sole basis for treatment. Nasal washings and aspirates are unacceptable for Xpert Xpress SARS-CoV-2/FLU/RSV testing.  Fact Sheet for Patients: SeriousBroker.it  Fact Sheet for Healthcare Providers: [place]  This test is not yet approved or cleared by the [employer] FDA and has been authorized for detection and/or diagnosis of SARS-CoV-2 by FDA under an Emergency Use Authorization (EUA). This EUA will remain  in effect (meaning this test can be used) for the duration of the COVID-19 declaration under Section 564(b)(1) of the Act, 21 U.S.C. section 360bbb-3(b)(1), unless the authorization is terminated or revoked.  Performed at [facility], [address] [place], [place] [address]    Studies/Results: [place] Abd 1 View  Result Date: 08/23/2020 CLINICAL DATA:  Abdominal distension and decreased intake EXAM: ABDOMEN - 1 VIEW COMPARISON:  08/21/2020 FINDINGS: Diffuse large and small bowel gas is noted. The overall appearance is similar to that seen on the prior exam suggestive of diffuse colonic ileus. No free air is seen. No mass lesion is seen. No bony abnormality is noted. No definitive volvulus is noted. IMPRESSION: Changes consistent with colonic ileus stable from 2 days previous. Electronically Signed   By: Alcide Clever M.D.   On: 08/23/2020 10:04   Medications: I have reviewed the patient's current medications. Scheduled Meds: . enoxaparin (LOVENOX) injection  40 mg Subcutaneous Q24H  . felbamate  900 mg Oral Q12H  . haloperidol  2 mg Oral Daily  . linaclotide  290 mcg Oral Daily  .  morphine injection  1 mg Intravenous Once  . pantoprazole sodium  40 mg Oral BID  . senna  25.8 mg Oral BID   Continuous Infusions: . sodium chloride    . dextrose 5 % and 0.45% NaCl 125 mL/hr at 08/24/20 0400   PRN Meds:.ketorolac, ondansetron **OR** ondansetron (ZOFRAN) IV   Assessment: Principal Problem:   Abdominal pain Active Problems:   Autism spectrum disorder with accompanying language impairment and intellectual disability, requiring very substantial support   Constipation   Decreased oral intake   Hypokalemia   Hypomagnesemia   Ogilvie syndrome   Acute metabolic encephalopathy    Plan: The patient abdomen is much softer today with better bowel sounds. The patient's lactulose will be stopped due to it being able to produce increased gas which the patient has had an ileus. I will start the patient on a clear liquid diet. The parents have been informed that the reason he is not having any further bowel movements is because his KUB from yesterday did not show any stool in the small intestines or the colon therefore he cannot pass  stools that he does not have. They are very concerned about the future of the patient's management of his constipation as an outpatient. They have been reassured that a plan will be made before they go home.   LOS: 2 days   Sherlyn Hay 08/24/2020, 2:41 PM Pager [phone] 7am-5pm  Check AMION for 5pm -7am coverage and on weekends

## 2020-08-24 NOTE — Progress Notes (Signed)
PROGRESS NOTE    Jonathan Kirby  YQM:[id number] DOB: 08/16/1986 DOA: 08/21/2020 PCP: Almetta Lovely, [facility]   Chief complaint.  Severe constipation Brief Narrative:  Jonathan Kirby a 34 y.o.malewith medical history significantforsevere autism, nonverbal at baseline, who was brought in by his parentsfor evaluation of abdominal distention and poor oral intake for couple of days. His mother states that his last bowel movement was a small amount on Sunday 11/28despite giving him laxatives and enemas. She got concerned because patient's abdomen is very distended and he appears uncomfortable. He also has very poor oral intake and decreased urine output. CT scan abdomen/pelvis with contrast showed severe dilated colon. Patient has been seen by general surgery, no surgical indication.  Patient is a seen by GI, rectal tube placed for decompression.  Also receiving lactulose, Senokot and Linzess   Assessment & Plan:   Principal Problem:   Abdominal pain Active Problems:   Autism spectrum disorder with accompanying language impairment and intellectual disability, requiring very substantial support   Constipation   Decreased oral intake   Hypokalemia   Hypomagnesemia   Ogilvie syndrome  #1.  Colonic ileus with Ogilvie syndrome. Patient is a followed by GI.  Continue fecal tube with decompression.  Patient started have bowel movements.  Abdominal distention improving. Continue current treatment with Linzess, scheduled lactulose and Senokot. Patient is still n.p.o., continue IV fluids for rehydration. Repeat KUB tomorrow  2.  Acute metabolic cephalopathy. Family states that patient still very sleepy, which is not normal for him.  He was also not able to urinate as he cannot stand up due to fecal tube. Continue IV fluids.  Check a UA and urine culture to rule out urinary tract infection. Continue as needed bladder scan and in and out catheter as needed.  3.  Hypernatremia.   Hypokalemia Sodium normalized on half-normal saline.  4.  Autism disorder with language impairment and intellectual disability      DVT prophylaxis: Lovenox Code Status: Full Family Communication: Parents at bedside Disposition Plan:  .   Status is: Inpatient  Remains inpatient appropriate because:Inpatient level of care appropriate due to severity of illness   Dispo: The patient is from: Home              Anticipated d/c is to: Home              Anticipated d/c date is: 3 days              Patient currently is not medically stable to d/c.        I/O last 3 completed shifts: In: 1250 [I.V.:1250] Out: 650 [Urine:350; Stool:300] No intake/output data recorded.     Consultants:   GI  Procedures: Rectal tube  Antimicrobials: None  Subjective: Patient could not provide any information.  Spoke with the parents, patient had over 300 mL of loose stools in the rectal tube.  Abdominal distention seem to be better. Patient still very sleepy, which is not normal for him.  He has not been asking for food. Patient normally stand up to urinate every 2 hours, he was not able to stand due to fecal tube, he cannot urinate.  He received in and out cath.  He does not seem to have any short of breath or cough. He does not seem to be in pain.  Objective: Vitals:   08/23/20 2034 08/24/20 0047 08/24/20 0445 08/24/20 0821  BP: 107/72 93/61 98/71  (!) 90/59  Pulse: 66 (!) 52 63 (!) 56  Resp: 16 15 16 16   Temp: (!) 97.4 F (36.3 C) 97.9 F (36.6 C) 97.6 F (36.4 C)   TempSrc: Axillary Axillary Oral   SpO2: 100% 99% 100% 99%  Weight:      Height:        Intake/Output Summary (Last 24 hours) at 08/24/2020 0950 Last data filed at 08/24/2020 0500 Gross per 24 hour  Intake 1250 ml  Output 650 ml  Net 600 ml   Filed Weights   08/21/20 1742  Weight: 48.5 kg    Examination:  General exam: Appears calm and comfortable  Respiratory system: Clear to auscultation. Respiratory  effort normal. Cardiovascular system: S1 & S2 heard, RRR. No JVD, murmurs, rubs, gallops or clicks. No pedal edema. Gastrointestinal system: Abdomen is nondistended, soft and nontender. No organomegaly or masses felt. Normal bowel sounds heard. Central nervous system: Drowsy and nonverbal.  Extremities: Symmetric 5 x 5 power. Skin: No rashes, lesions or ulcers     Data Reviewed: I have personally reviewed following labs and imaging studies  CBC: Recent Labs  Lab 08/21/20 1746 08/22/20 0801 08/23/20 0343 08/24/20 0557  WBC 13.5* 13.7* 9.5 4.7  NEUTROABS  --   --  6.4 2.2  HGB 13.7 13.4 11.8* 10.3*  HCT 41.4 40.0 35.1* 31.9*  MCV 89.4 89.1 89.1 91.7  PLT 312 295 259 237   Basic Metabolic Panel: Recent Labs  Lab 08/21/20 1746 08/21/20 2159 08/22/20 0719 08/23/20 0343 08/24/20 0557  NA 143  --  145 146* 142  K 2.8*  --  3.4* 3.7 3.3*  CL 99  --  108 111 109  CO2 27  --  25 26 26   GLUCOSE 136*  --  161* 98 104*  BUN 20  --  14 17 13   CREATININE 0.88  --  0.71 0.75 0.72  CALCIUM 9.1  --  8.6* 8.4* 8.0*  MG  --  1.7  --  2.1 1.7   GFR: Estimated Creatinine Clearance: 89.3 mL/min (by C-G formula based on SCr of 0.72 mg/dL). Liver Function Tests: Recent Labs  Lab 08/21/20 1746  AST 30  ALT 48*  ALKPHOS 97  BILITOT 1.1  PROT 8.5*  ALBUMIN 4.1   Recent Labs  Lab 08/21/20 1746  LIPASE 18   No results for input(s): AMMONIA in the last 168 hours. Coagulation Profile: No results for input(s): INR, PROTIME in the last 168 hours. Cardiac Enzymes: No results for input(s): CKTOTAL, CKMB, CKMBINDEX, TROPONINI in the last 168 hours. BNP (last 3 results) No results for input(s): PROBNP in the last 8760 hours. HbA1C: No results for input(s): HGBA1C in the last 72 hours. CBG: No results for input(s): GLUCAP in the last 168 hours. Lipid Profile: No results for input(s): CHOL, HDL, LDLCALC, TRIG, CHOLHDL, LDLDIRECT in the last 72 hours. Thyroid Function Tests: No  results for input(s): TSH, T4TOTAL, FREET4, T3FREE, THYROIDAB in the last 72 hours. Anemia Panel: No results for input(s): VITAMINB12, FOLATE, FERRITIN, TIBC, IRON, RETICCTPCT in the last 72 hours. Sepsis Labs: Recent Labs  Lab 08/21/20 1919  LATICACIDVEN 1.8    Recent Results (from the past 240 hour(s))  Resp Panel by RT-PCR (Flu A&B, Covid) Nasopharyngeal Swab     Status: None   Collection Time: 08/21/20  9:33 PM   Specimen: Nasopharyngeal Swab; Nasopharyngeal(NP) swabs in vial transport medium  Result Value Ref Range Status   SARS Coronavirus 2 by RT PCR NEGATIVE NEGATIVE Final    Comment: (NOTE) SARS-CoV-2 target nucleic acids  are NOT DETECTED.  The SARS-CoV-2 RNA is generally detectable in upper respiratory specimens during the acute phase of infection. The lowest concentration of SARS-CoV-2 viral copies this assay can detect is 138 copies/mL. A negative result does not preclude SARS-Cov-2 infection and should not be used as the sole basis for treatment or other patient management decisions. A negative result may occur with  improper specimen collection/handling, submission of specimen other than nasopharyngeal swab, presence of viral mutation(s) within the areas targeted by this assay, and inadequate number of viral copies(<138 copies/mL). A negative result must be combined with clinical observations, patient history, and epidemiological information. The expected result is Negative.  Fact Sheet for Patients:  BloggerCourse.comhttps://www.fda.gov/media/152166/download  Fact Sheet for Healthcare Providers:  SeriousBroker.ithttps://www.fda.gov/media/152162/download  This test is no t yet approved or cleared by the [place] FDA and  has been authorized for detection and/or diagnosis of SARS-CoV-2 by FDA under an Emergency Use Authorization (EUA). This EUA will remain  in effect (meaning this test can be used) for the duration of the COVID-19 declaration under Section 564(b)(1) of the Act,  21 U.S.C.section 360bbb-3(b)(1), unless the authorization is terminated  or revoked sooner.       Influenza A by PCR NEGATIVE NEGATIVE Final   Influenza B by PCR NEGATIVE NEGATIVE Final    Comment: (NOTE) The Xpert Xpress SARS-CoV-2/FLU/RSV plus assay is intended as an aid in the diagnosis of influenza from Nasopharyngeal swab specimens and should not be used as a sole basis for treatment. Nasal washings and aspirates are unacceptable for Xpert Xpress SARS-CoV-2/FLU/RSV testing.  Fact Sheet for Patients: BloggerCourse.comhttps://www.fda.gov/media/152166/download  Fact Sheet for Healthcare Providers: SeriousBroker.ithttps://www.fda.gov/media/152162/download  This test is not yet approved or cleared by the [place] FDA and has been authorized for detection and/or diagnosis of SARS-CoV-2 by FDA under an Emergency Use Authorization (EUA). This EUA will remain in effect (meaning this test can be used) for the duration of the COVID-19 declaration under Section 564(b)(1) of the Act, 21 U.S.C. section 360bbb-3(b)(1), unless the authorization is terminated or revoked.  Performed at [facility], [address] [place], [place] [address]          Radiology Studies: [place] Abd 1 View  Result Date: 08/23/2020 CLINICAL DATA:  Abdominal distension and decreased intake EXAM: ABDOMEN - 1 VIEW COMPARISON:  08/21/2020 FINDINGS: Diffuse large and small bowel gas is noted. The overall appearance is similar to that seen on the prior exam suggestive of diffuse colonic ileus. No free air is seen. No mass lesion is seen. No bony abnormality is noted. No definitive volvulus is noted. IMPRESSION: Changes consistent with colonic ileus stable from 2 days previous. Electronically Signed   By: Alcide CleverMark  Lukens M.D.   On: 08/23/2020 10:04        Scheduled Meds: . enoxaparin (LOVENOX) injection  40 mg Subcutaneous Q24H  . felbamate  900 mg Oral Q12H  . haloperidol  2 mg Oral Daily  . lactulose  20 g Oral BID  .  linaclotide  290 mcg Oral Daily  .  morphine injection  1 mg Intravenous Once  . pantoprazole sodium  40 mg Oral BID  . senna  25.8 mg Oral BID   Continuous Infusions: . sodium chloride    . dextrose 5 % and 0.45% NaCl 125 mL/hr at 08/24/20 0400  . potassium chloride 10 mEq (08/24/20 0920)     LOS: 2 days    Time spent: 26 minutes    Marrion Coyekui Jenisha Faison, MD [facility]  To contact the attending provider between 7A-7P or the covering provider during after hours 7P-7A, please log into the web site [email or web address] and access using universal [place] password for that web site. If you do not have the password, please call the hospital operator.  08/24/2020, 9:50 AM

## 2020-08-25 ENCOUNTER — Inpatient Hospital Stay: Payer: Medicaid Other

## 2020-08-25 DIAGNOSIS — K2210 Ulcer of esophagus without bleeding: Secondary | ICD-10-CM

## 2020-08-25 LAB — VITAMIN B12: Vitamin B-12: 864 pg/mL (ref 180–914)

## 2020-08-25 LAB — BASIC METABOLIC PANEL
Anion gap: 5 (ref 5–15)
BUN: 7 mg/dL (ref 6–20)
CO2: 26 mmol/L (ref 22–32)
Calcium: 7.9 mg/dL — ABNORMAL LOW (ref 8.9–10.3)
Chloride: 107 mmol/L (ref 98–111)
Creatinine, Ser: 0.52 mg/dL — ABNORMAL LOW (ref 0.61–1.24)
GFR, Estimated: >60 mL/min (ref >60)
Glucose, Bld: 120 mg/dL — ABNORMAL HIGH (ref 70–99)
Potassium: 3.1 mmol/L — ABNORMAL LOW (ref 3.5–5.1)
Sodium: 138 mmol/L (ref 135–145)

## 2020-08-25 LAB — IRON AND TIBC
Iron: 85 ug/dL (ref 45–182)
Saturation Ratios: 43 % — ABNORMAL HIGH (ref 17.9–39.5)
TIBC: 200 ug/dL — ABNORMAL LOW (ref 250–450)
UIBC: 115 ug/dL

## 2020-08-25 LAB — FERRITIN: Ferritin: 64 ng/mL (ref 24–336)

## 2020-08-25 LAB — MAGNESIUM: Magnesium: 1.6 mg/dL — ABNORMAL LOW (ref 1.7–2.4)

## 2020-08-25 LAB — PHOSPHORUS: Phosphorus: 2.8 mg/dL (ref 2.5–4.6)

## 2020-08-25 MED ORDER — POTASSIUM CHLORIDE 10 MEQ/100ML IV SOLN
10.0000 meq | INTRAVENOUS | Status: AC
  Administered 2020-08-25 (×3): 10 meq via INTRAVENOUS
  Filled 2020-08-25 (×2): qty 100

## 2020-08-25 MED ORDER — PANTOPRAZOLE SODIUM POWD: 40.0000 mg | Freq: Two times a day (BID) | Status: DC

## 2020-08-25 MED ORDER — MAGNESIUM SULFATE 2 GM/50ML IV SOLN
2.0000 g | Freq: Once | INTRAVENOUS | Status: AC
  Administered 2020-08-25: 2 g via INTRAVENOUS
  Filled 2020-08-25: qty 50

## 2020-08-25 MED ORDER — PANTOPRAZOLE SODIUM 40 MG PO PACK
40.0000 mg | PACK | Freq: Two times a day (BID) | ORAL | Status: DC
  Administered 2020-08-26 – 2020-08-27 (×3): 40 mg via ORAL
  Filled 2020-08-25 (×3): qty 20

## 2020-08-25 NOTE — Progress Notes (Signed)
Arlyss Repress, MD [address]  [address]  [place], [place] [address]  Main: [phone]  Fax: [phone] Pager: [phone]   Subjective: No acute events overnight.  Patient's father is bedside.  Patient is comfortable, sleeping.  NG tube has been removed.  He tolerated some liquids today.  He does have rectal tube in place filled with air, no stool in it.  Abdominal distention is also improving.,  Is receiving IV potassium   Objective: Vital signs in last 24 hours: Vitals:   08/25/20 0614 08/25/20 0724 08/25/20 1138 08/25/20 1625  BP: 103/67 101/69 102/71 104/71  Pulse: (!) 54 (!) 53 62 87  Resp: 16 16 16 19   Temp: (!) 97.5 F (36.4 C) 97.9 F (36.6 C) 98 F (36.7 C) 99 F (37.2 C)  TempSrc: Oral Oral Axillary Oral  SpO2: 99% 100% 99% 100%  Weight:      Height:       Weight change:   Intake/Output Summary (Last 24 hours) at 08/25/2020 1804 Last data filed at 08/25/2020 1525 Gross per 24 hour  Intake 3517.05 ml  Output 300 ml  Net 3217.05 ml     Exam: Heart:: Regular rate and rhythm or S1S2 present Lungs: normal and clear to auscultation Abdomen: soft, nontender, mildly distended, tympanic to percussion, normal bowel sounds   Lab Results: CBC Latest Ref Rng & Units 08/24/2020 08/23/2020 08/22/2020  WBC 4.0 - 10.5 K/uL 4.7 9.5 13.7(H)  Hemoglobin 13.0 - 17.0 g/dL 10.3(L) 11.8(L) 13.4  Hematocrit 39 - 52 % 31.9(L) 35.1(L) 40.0  Platelets 150 - 400 K/uL 237 259 295   CMP Latest Ref Rng & Units 08/25/2020 08/24/2020 08/23/2020  Glucose 70 - 99 mg/dL 14/12/2019) 086(P) 98  BUN 6 - 20 mg/dL 7 13 17   Creatinine 0.61 - 1.24 mg/dL 619(J) 0.93(O  Sodium 135 - 145 mmol/L 138 142 146(H)  Potassium 3.5 - 5.1 mmol/L 3.1(L) 3.3(L) 3.7  Chloride 98 - 111 mmol/L 107 109 111  CO2 22 - 32 mmol/L 26 26 26   Calcium 8.9 - 10.3 mg/dL 7.9(L) 8.0(L) 8.4(L)  Total Protein 6.5 - 8.1 g/dL - - -  Total Bilirubin 0.3 - 1.2 mg/dL - - -  Alkaline Phos 38 - 126 U/L - - -  AST  15 - 41 U/L - - -  ALT 0 - 44 U/L - - -    Micro Results: Recent Results (from the past 240 hour(s))  Resp Panel by RT-PCR (Flu A&B, Covid) Nasopharyngeal Swab     Status: None   Collection Time: 08/21/20  9:33 PM   Specimen: Nasopharyngeal Swab; Nasopharyngeal(NP) swabs in vial transport medium  Result Value Ref Range Status   SARS Coronavirus 2 by RT PCR NEGATIVE NEGATIVE Final    Comment: (NOTE) SARS-CoV-2 target nucleic acids are NOT DETECTED.  The SARS-CoV-2 RNA is generally detectable in upper respiratory specimens during the acute phase of infection. The lowest concentration of SARS-CoV-2 viral copies this assay can detect is 138 copies/mL. A negative result does not preclude SARS-Cov-2 infection and should not be used as the sole basis for treatment or other patient management decisions. A negative result may occur with  improper specimen collection/handling, submission of specimen other than nasopharyngeal swab, presence of viral mutation(s) within the areas targeted by this assay, and inadequate number of viral copies(<138 copies/mL). A negative result must be combined with clinical observations, patient history, and epidemiological information. The expected result is Negative.  Fact Sheet for Patients:  BloggerCourse.com  Fact Sheet for Healthcare Providers:  SeriousBroker.it  This test is no t yet approved or cleared by the [place] FDA and  has been authorized for detection and/or diagnosis of SARS-CoV-2 by FDA under an Emergency Use Authorization (EUA). This EUA will remain  in effect (meaning this test can be used) for the duration of the COVID-19 declaration under Section 564(b)(1) of the Act, 21 U.S.C.section 360bbb-3(b)(1), unless the authorization is terminated  or revoked sooner.       Influenza A by PCR NEGATIVE NEGATIVE Final   Influenza B by PCR NEGATIVE NEGATIVE Final    Comment: (NOTE) The  Xpert Xpress SARS-CoV-2/FLU/RSV plus assay is intended as an aid in the diagnosis of influenza from Nasopharyngeal swab specimens and should not be used as a sole basis for treatment. Nasal washings and aspirates are unacceptable for Xpert Xpress SARS-CoV-2/FLU/RSV testing.  Fact Sheet for Patients: BloggerCourse.com  Fact Sheet for Healthcare Providers: SeriousBroker.it  This test is not yet approved or cleared by the [place] FDA and has been authorized for detection and/or diagnosis of SARS-CoV-2 by FDA under an Emergency Use Authorization (EUA). This EUA will remain in effect (meaning this test can be used) for the duration of the COVID-19 declaration under Section 564(b)(1) of the Act, 21 U.S.C. section 360bbb-3(b)(1), unless the authorization is terminated or revoked.  Performed at [facility], [address] [place], [place] [address]    Studies/Results: DG Abd 1 View  Result Date: 08/25/2020 CLINICAL DATA:  Ileus, abdominal distension, poor oral intake, severe autism EXAM: ABDOMEN - 1 VIEW COMPARISON:  Portable exam 0805 hours compared to 08/23/2020 FINDINGS: Gaseous distention of colon, decreased. Small amount of scattered stool in LEFT abdomen. Air-filled normal and upper normal caliber small bowel loops. Findings favor ileus. No bowel wall thickening, urinary tract calcifications, or osseous findings. IMPRESSION: Gaseous distention of colon consistent with ileus, decreased from previous exam. Electronically Signed   By: Ulyses Southward M.D.   On: 08/25/2020 08:22   Medications:  I have reviewed the patient's current medications. Prior to Admission:  Medications Prior to Admission  Medication Sig Dispense Refill Last Dose  . felbamate (FELBATOL) 600 MG/5ML suspension Take 7.5 mL by mouth twice daily 474 mL 5 08/21/2020 at 2230  . haloperidol (HALDOL) 2 MG tablet Crush 1 tablet in place in a puree (Patient taking  differently: as needed. Crush 1 tablet in place in a puree) 10 tablet 5 Past Month at PRN  . pantoprazole (PROTONIX) 40 MG tablet Take 1 tablet (40 mg total) by mouth 2 (two) times daily. 60 tablet 1 08/21/2020 at Unknown time  . Plecanatide (TRULANCE) 3 MG TABS Take 3 mg by mouth daily.   08/21/2020 at 0800  . Sennosides 25 MG TABS Take 1 tablet (25 mg total) by mouth in the morning and at bedtime. 120 tablet 3 08/20/2020 at 2230  . sodium phosphate (FLEET) 7-19 GM/118ML ENEM Place 1 enema rectally every three (3) days as needed. If no bowel movement   prn at prn  . linaclotide (LINZESS) 290 MCG CAPS capsule Take 1 capsule (290 mcg total) by mouth daily. (Patient not taking: Reported on 08/21/2020) 30 capsule 3 Not Taking at Unknown time   Scheduled: . enoxaparin (LOVENOX) injection  40 mg Subcutaneous Q24H  . felbamate  900 mg Oral Q12H  . haloperidol  2 mg Oral Daily  . linaclotide  290 mcg Oral Daily  .  morphine injection  1 mg Intravenous Once  .  pantoprazole sodium  40 mg Oral BID  . senna  25.8 mg Oral BID   Continuous: . sodium chloride    . dextrose 5 % and 0.45% NaCl 125 mL/hr at 08/25/20 0175   ZWC:HENIDPOEU, ondansetron **OR** ondansetron (ZOFRAN) IV Anti-infectives (From admission, onward)   None     Scheduled Meds: . enoxaparin (LOVENOX) injection  40 mg Subcutaneous Q24H  . felbamate  900 mg Oral Q12H  . haloperidol  2 mg Oral Daily  . linaclotide  290 mcg Oral Daily  .  morphine injection  1 mg Intravenous Once  . pantoprazole sodium  40 mg Oral BID  . senna  25.8 mg Oral BID   Continuous Infusions: . sodium chloride    . dextrose 5 % and 0.45% NaCl 125 mL/hr at 08/25/20 0729   PRN Meds:.ketorolac, ondansetron **OR** ondansetron (ZOFRAN) IV   Assessment: Principal Problem:   Abdominal pain Active Problems:   Autism spectrum disorder with accompanying language impairment and intellectual disability, requiring very substantial support   Constipation    Decreased oral intake   Hypokalemia   Hypomagnesemia   Ogilvie syndrome   Acute metabolic encephalopathy  Plan: Chronic constipation with Ogilvie syndrome Abdominal distention is improving, NG tube has been removed Recommend to continue Linzess 290 MCG daily Slowly advance diet as tolerated Monitor electrolytes closely, keep potassium above 4, magnesium above 2, phosphorus above 3  History of erosive esophagitis: Continue Protonix 40 mg twice daily indefinitely Patient is scheduled to undergo upper endoscopy on 12/8 with Dr. Maximino Greenland as outpatient if he is discharged by then    LOS: 3 days   Kasey Ewings 08/25/2020, 6:04 PM

## 2020-08-25 NOTE — Progress Notes (Signed)
PROGRESS NOTE    Jonathan Kirby  SAY:[id number] DOB: 1986-09-02 DOA: 08/21/2020 PCP: Almetta Lovely, [facility]   Chief complaint.  Severe constipation Brief Narrative:  Jonathan Kirby a 34 y.o.malewith medical history significantforsevere autism, nonverbal at baseline, who was brought in by his parentsfor evaluation of abdominal distention and poor oral intake for couple of days. His mother states that his last bowel movement was a small amount on Sunday 11/28despite giving him laxatives and enemas. She got concerned because patient's abdomen is very distended and he appears uncomfortable. He also has very poor oral intake and decreased urine output. CT scan abdomen/pelvis with contrast showed severe dilated colon. Patient has been seen by general surgery, no surgical indication. Patient is a seen by GI, rectal tube placed for decompression.  Also receiving lactulose, Senokot and Linzess   Assessment & Plan:   Principal Problem:   Abdominal pain Active Problems:   Autism spectrum disorder with accompanying language impairment and intellectual disability, requiring very substantial support   Constipation   Decreased oral intake   Hypokalemia   Hypomagnesemia   Ogilvie syndrome   Acute metabolic encephalopathy  #1.  Colonic ileus with Ogilvie syndrome. Appreciate GI consult.  Patient has loose stools from rectal tube.  Started liquid diet by GI.  Continue monitor for another day. Continue IV fluids for rehydration. KUB performed today showed improved colon distention.  #2 pure acute metabolic encephalopathy. Patient seem to be better today with less sleepiness.  He was able to eat small amount of liquid diet. UA does not suggest UTI.  3.  Hypernatremia, hypokalemia and hypomagnesemia. Sodium level has normalized.  Continue potassium and magnesium supplement.  4.  Autism disorder with language impairment and intellectual disability.     DVT prophylaxis:  Lovenox Code Status: Full Family Communication: Parents at bedside Disposition Plan:     Status is: Inpatient  Remains inpatient appropriate because:Inpatient level of care appropriate due to severity of illness   Dispo: The patient is from: Home              Anticipated d/c is to: Home              Anticipated d/c date is: 2 days              Patient currently is not medically stable to d/c.        I/O last 3 completed shifts: In: 1250 [I.V.:1250] Out: 950 [Urine:650; Stool:300] No intake/output data recorded.     Consultants:   GI  Procedures: Rectal tube  Antimicrobials: None  Subjective: Patient is a left sleepy since yesterday.  He was able to take in small amount of liquid diet.  Rectal tube draining is slowing down. No short of breath or cough. No fever or chills.  Objective: Vitals:   08/24/20 1130 08/24/20 1515 08/25/20 0614 08/25/20 0724  BP: 107/70 109/67 103/67 101/69  Pulse: 62 (!) 57 (!) 54 (!) 53  Resp:   16 16  Temp: (!) 97.4 F (36.3 C) (!) 97.5 F (36.4 C) (!) 97.5 F (36.4 C) 97.9 F (36.6 C)  TempSrc:   Oral Oral  SpO2: 100% 100% 99% 100%  Weight:      Height:        Intake/Output Summary (Last 24 hours) at 08/25/2020 0943 Last data filed at 08/24/2020 2227 Gross per 24 hour  Intake --  Output 300 ml  Net -300 ml   Filed Weights   08/21/20 1742  Weight: 48.5  kg    Examination:  General exam: Appears calm and comfortable  Respiratory system: Clear to auscultation. Respiratory effort normal. Cardiovascular system: S1 & S2 heard, RRR. No JVD, murmurs, rubs, gallops or clicks. No pedal edema. Gastrointestinal system: Abdomen is nondistended, soft and nontender. No organomegaly or masses felt. Normal bowel sounds heard. Central nervous system: Alert and aphasic. Extremities: Symmetric 5 x 5 power. Skin: No rashes, lesions or ulcers     Data Reviewed: I have personally reviewed following labs and imaging  studies  CBC: Recent Labs  Lab 08/21/20 1746 08/22/20 0801 08/23/20 0343 08/24/20 0557  WBC 13.5* 13.7* 9.5 4.7  NEUTROABS  --   --  6.4 2.2  HGB 13.7 13.4 11.8* 10.3*  HCT 41.4 40.0 35.1* 31.9*  MCV 89.4 89.1 89.1 91.7  PLT 312 295 259 237   Basic Metabolic Panel: Recent Labs  Lab 08/21/20 1746 08/21/20 2159 08/22/20 0719 08/23/20 0343 08/24/20 0557 08/25/20 0433  NA 143  --  145 146* 142 138  K 2.8*  --  3.4* 3.7 3.3* 3.1*  CL 99  --  108 111 109 107  CO2 27  --  25 26 26 26   GLUCOSE 136*  --  161* 98 104* 120*  BUN 20  --  14 17 13 7   CREATININE 0.88  --  0.71 0.75 0.72 0.52*  CALCIUM 9.1  --  8.6* 8.4* 8.0* 7.9*  MG  --  1.7  --  2.1 1.7 1.6*   GFR: Estimated Creatinine Clearance: 89.3 mL/min (A) (by C-G formula based on SCr of 0.52 mg/dL (L)). Liver Function Tests: Recent Labs  Lab 08/21/20 1746  AST 30  ALT 48*  ALKPHOS 97  BILITOT 1.1  PROT 8.5*  ALBUMIN 4.1   Recent Labs  Lab 08/21/20 1746  LIPASE 18   No results for input(s): AMMONIA in the last 168 hours. Coagulation Profile: No results for input(s): INR, PROTIME in the last 168 hours. Cardiac Enzymes: No results for input(s): CKTOTAL, CKMB, CKMBINDEX, TROPONINI in the last 168 hours. BNP (last 3 results) No results for input(s): PROBNP in the last 8760 hours. HbA1C: No results for input(s): HGBA1C in the last 72 hours. CBG: No results for input(s): GLUCAP in the last 168 hours. Lipid Profile: No results for input(s): CHOL, HDL, LDLCALC, TRIG, CHOLHDL, LDLDIRECT in the last 72 hours. Thyroid Function Tests: No results for input(s): TSH, T4TOTAL, FREET4, T3FREE, THYROIDAB in the last 72 hours. Anemia Panel: No results for input(s): VITAMINB12, FOLATE, FERRITIN, TIBC, IRON, RETICCTPCT in the last 72 hours. Sepsis Labs: Recent Labs  Lab 08/21/20 1919  LATICACIDVEN 1.8    Recent Results (from the past 240 hour(s))  Resp Panel by RT-PCR (Flu A&B, Covid) Nasopharyngeal Swab     Status:  None   Collection Time: 08/21/20  9:33 PM   Specimen: Nasopharyngeal Swab; Nasopharyngeal(NP) swabs in vial transport medium  Result Value Ref Range Status   SARS Coronavirus 2 by RT PCR NEGATIVE NEGATIVE Final    Comment: (NOTE) SARS-CoV-2 target nucleic acids are NOT DETECTED.  The SARS-CoV-2 RNA is generally detectable in upper respiratory specimens during the acute phase of infection. The lowest concentration of SARS-CoV-2 viral copies this assay can detect is 138 copies/mL. A negative result does not preclude SARS-Cov-2 infection and should not be used as the sole basis for treatment or other patient management decisions. A negative result may occur with  improper specimen collection/handling, submission of specimen other than nasopharyngeal swab, presence of  viral mutation(s) within the areas targeted by this assay, and inadequate number of viral copies(<138 copies/mL). A negative result must be combined with clinical observations, patient history, and epidemiological information. The expected result is Negative.  Fact Sheet for Patients:  BloggerCourse.com  Fact Sheet for Healthcare Providers:  SeriousBroker.it  This test is no t yet approved or cleared by the [place] FDA and  has been authorized for detection and/or diagnosis of SARS-CoV-2 by FDA under an Emergency Use Authorization (EUA). This EUA will remain  in effect (meaning this test can be used) for the duration of the COVID-19 declaration under Section 564(b)(1) of the Act, 21 U.S.C.section 360bbb-3(b)(1), unless the authorization is terminated  or revoked sooner.       Influenza A by PCR NEGATIVE NEGATIVE Final   Influenza B by PCR NEGATIVE NEGATIVE Final    Comment: (NOTE) The Xpert Xpress SARS-CoV-2/FLU/RSV plus assay is intended as an aid in the diagnosis of influenza from Nasopharyngeal swab specimens and should not be used as a sole basis for  treatment. Nasal washings and aspirates are unacceptable for Xpert Xpress SARS-CoV-2/FLU/RSV testing.  Fact Sheet for Patients: BloggerCourse.com  Fact Sheet for Healthcare Providers: SeriousBroker.it  This test is not yet approved or cleared by the [place] FDA and has been authorized for detection and/or diagnosis of SARS-CoV-2 by FDA under an Emergency Use Authorization (EUA). This EUA will remain in effect (meaning this test can be used) for the duration of the COVID-19 declaration under Section 564(b)(1) of the Act, 21 U.S.C. section 360bbb-3(b)(1), unless the authorization is terminated or revoked.  Performed at [facility], [address] [place], [place] [address]          Radiology Studies: DG Abd 1 View  Result Date: 08/25/2020 CLINICAL DATA:  Ileus, abdominal distension, poor oral intake, severe autism EXAM: ABDOMEN - 1 VIEW COMPARISON:  Portable exam 0805 hours compared to 08/23/2020 FINDINGS: Gaseous distention of colon, decreased. Small amount of scattered stool in LEFT abdomen. Air-filled normal and upper normal caliber small bowel loops. Findings favor ileus. No bowel wall thickening, urinary tract calcifications, or osseous findings. IMPRESSION: Gaseous distention of colon consistent with ileus, decreased from previous exam. Electronically Signed   By: Ulyses Southward M.D.   On: 08/25/2020 08:22        Scheduled Meds:  enoxaparin (LOVENOX) injection  40 mg Subcutaneous Q24H   felbamate  900 mg Oral Q12H   haloperidol  2 mg Oral Daily   linaclotide  290 mcg Oral Daily    morphine injection  1 mg Intravenous Once   pantoprazole sodium  40 mg Oral BID   senna  25.8 mg Oral BID   Continuous Infusions:  sodium chloride     dextrose 5 % and 0.45% NaCl 125 mL/hr at 08/25/20 0729   potassium chloride 10 mEq (08/25/20 0935)     LOS: 3 days    Time spent: 26 minutes    Marrion Coy, MD [facility]   To contact the attending provider between 7A-7P or the covering provider during after hours 7P-7A, please log into the web site [email or web address] and access using universal [place] password for that web site. If you do not have the password, please call the hospital operator.  08/25/2020, 9:43 AM

## 2020-08-26 ENCOUNTER — Inpatient Hospital Stay: Payer: Medicaid Other

## 2020-08-26 DIAGNOSIS — J690 Pneumonitis due to inhalation of food and vomit: Secondary | ICD-10-CM

## 2020-08-26 DIAGNOSIS — E43 Unspecified severe protein-calorie malnutrition: Secondary | ICD-10-CM

## 2020-08-26 LAB — CBC WITH DIFFERENTIAL/PLATELET
Abs Immature Granulocytes: 0.44 10*3/uL — ABNORMAL HIGH (ref 0.00–0.07)
Basophils Absolute: 0.1 10*3/uL (ref 0.0–0.1)
Basophils Relative: 0 %
Eosinophils Absolute: 0 10*3/uL (ref 0.0–0.5)
Eosinophils Relative: 0 %
HCT: 34.1 % — ABNORMAL LOW (ref 39.0–52.0)
Hemoglobin: 11.7 g/dL — ABNORMAL LOW (ref 13.0–17.0)
Immature Granulocytes: 2 %
Lymphocytes Relative: 7 %
Lymphs Abs: 1.4 10*3/uL (ref 0.7–4.0)
MCH: 29.8 pg (ref 26.0–34.0)
MCHC: 34.3 g/dL (ref 30.0–36.0)
MCV: 86.8 fL (ref 80.0–100.0)
Monocytes Absolute: 2 10*3/uL — ABNORMAL HIGH (ref 0.1–1.0)
Monocytes Relative: 10 %
Neutro Abs: 16.7 10*3/uL — ABNORMAL HIGH (ref 1.7–7.7)
Neutrophils Relative %: 81 %
Platelets: 268 10*3/uL (ref 150–400)
RBC: 3.93 MIL/uL — ABNORMAL LOW (ref 4.22–5.81)
RDW: 14 % (ref 11.5–15.5)
WBC: 20.7 10*3/uL — ABNORMAL HIGH (ref 4.0–10.5)
nRBC: 0 % (ref 0.0–0.2)

## 2020-08-26 LAB — BASIC METABOLIC PANEL
Anion gap: 8 (ref 5–15)
BUN: 8 mg/dL (ref 6–20)
CO2: 25 mmol/L (ref 22–32)
Calcium: 8.2 mg/dL — ABNORMAL LOW (ref 8.9–10.3)
Chloride: 101 mmol/L (ref 98–111)
Creatinine, Ser: 0.83 mg/dL (ref 0.61–1.24)
GFR, Estimated: >60 mL/min (ref >60)
Glucose, Bld: 134 mg/dL — ABNORMAL HIGH (ref 70–99)
Potassium: 3.7 mmol/L (ref 3.5–5.1)
Sodium: 134 mmol/L — ABNORMAL LOW (ref 135–145)

## 2020-08-26 LAB — MAGNESIUM: Magnesium: 1.6 mg/dL — ABNORMAL LOW (ref 1.7–2.4)

## 2020-08-26 LAB — PROCALCITONIN: Procalcitonin: 0.94 ng/mL

## 2020-08-26 MED ORDER — MAGNESIUM SULFATE 50 % IJ SOLN: 3.0000 g | Freq: Once | INTRAVENOUS | Status: DC

## 2020-08-26 MED ORDER — BOOST / RESOURCE BREEZE PO LIQD CUSTOM
1.0000 | Freq: Three times a day (TID) | ORAL | Status: DC
  Administered 2020-08-26 – 2020-08-27 (×2): 1 via ORAL

## 2020-08-26 MED ORDER — MAGNESIUM SULFATE IN D5W 1-5 GM/100ML-% IV SOLN
1.0000 g | Freq: Once | INTRAVENOUS | Status: AC
  Administered 2020-08-26: 1 g via INTRAVENOUS
  Filled 2020-08-26: qty 100

## 2020-08-26 MED ORDER — SENNOSIDES 8.8 MG/5ML PO SYRP
15.0000 mL | ORAL_SOLUTION | Freq: Two times a day (BID) | ORAL | Status: DC
  Filled 2020-08-26 (×9): qty 15

## 2020-08-26 MED ORDER — PIPERACILLIN-TAZOBACTAM 3.375 G IVPB
3.3750 g | Freq: Three times a day (TID) | INTRAVENOUS | Status: DC
  Administered 2020-08-26 – 2020-08-30 (×13): 3.375 g via INTRAVENOUS
  Filled 2020-08-26 (×13): qty 50

## 2020-08-26 MED ORDER — DEXTROSE IN LACTATED RINGERS 5 % IV SOLN: INTRAVENOUS | Status: DC

## 2020-08-26 MED ORDER — MAGNESIUM SULFATE 2 GM/50ML IV SOLN
2.0000 g | Freq: Once | INTRAVENOUS | Status: AC
  Administered 2020-08-26: 2 g via INTRAVENOUS
  Filled 2020-08-26: qty 50

## 2020-08-26 NOTE — Progress Notes (Signed)
PROGRESS NOTE    Jonathan Kirby  JIR:[id number] DOB: April 30, 1986 DOA: 08/21/2020 PCP: Almetta Lovely, [facility]   Chief complaint.  Abdominal distention. Brief Narrative:  Jonathan Kirby a 34 y.o.malewith medical history significantforsevere autism, nonverbal at baseline, who was brought in by his parentsfor evaluation of abdominal distention and poor oral intake for couple of days. His mother states that his last bowel movement was a small amount on Sunday 11/28despite giving him laxatives and enemas. She got concerned because patient's abdomen is very distended and he appears uncomfortable. He also has very poor oral intake and decreased urine output. CT scan abdomen/pelvis with contrast showed severe dilated colon. Patient has been seen by general surgery, no surgical indication. Patient is a seen by GI, rectal tube placed for decompression. Also receiving lactulose,Senokot and Linzess.  12/7.  Patient is sleepy, poor appetite.  Has a cough.  Increase leukocytosis.  Cover for aspiration pneumonia with Zosyn.   Assessment & Plan:   Principal Problem:   Abdominal pain Active Problems:   Autism spectrum disorder with accompanying language impairment and intellectual disability, requiring very substantial support   Constipation   Decreased oral intake   Hypokalemia   Hypomagnesemia   Ogilvie syndrome   Acute metabolic encephalopathy  #1.  Likely aspiration pneumonia. Patient still has significant sleepiness, poor appetite, has a cough.  But no shortness of breath.  Reviewed chest x-ray independently, did not see significant infiltrates.  However, given patient sudden increase white cell count was elevated procalcitonin level, patient probably had aspiration pneumonia. We will cover for Zosyn now.  Follow. Abdominal exam did not show any distention, soft, not tender.  Leukocytosis not from GI source.  #2.  Colonic ileus with Ogilvie syndrome. Initial CT scan showed a  severe colonic dilation.  Patient has been evaluate by general surgery, no surgery needed.  Patient has been followed by GI, rectal tube placed for decompression. Repeated KUB showed large amount of stool in the colon.  Awaiting for GI input today. Continue IV fluids.  3.  Acute metabolic encephalopathy. Patient still very sleepy. UA x2 - for UTI.  #4.  Hypernatremia, hypokalemia and hypomagnesemia. Hypernatremia resolved, now developed mild hyponatremia.  Change IV fluid from half-normal saline to lactated Ringer. continue supplement magnesium.  5.  Autism disorder with language impairment and intellectual disability.  6.  Severe protein calorie malnutrition. Patient BMI is 17.8, significant muscle atrophy.  We will supplement when patient able to take p.o.    DVT prophylaxis: Lovenox Code Status: Full Family Communication: Parents at bedside Disposition Plan: Patient can be discharged when he can tolerated a solid diet and abdominal distention resolves.  That may take 3 or 4 days. .   Status is: Inpatient  Remains inpatient appropriate because:Inpatient level of care appropriate due to severity of illness   Dispo: The patient is from: Home              Anticipated d/c is to: Home              Anticipated d/c date is: > 3 days              Patient currently is not medically stable to d/c.        I/O last 3 completed shifts: In: 3517.1 [P.O.:240; I.V.:3084.7; IV Piggyback:192.4] Out: 0  No intake/output data recorded.     Consultants:   GI  Procedures: Rectal tube  Antimicrobials:  Zosyn  Subjective: Spoke with patient parent, patient is more  sleepy today.  Very poor appetite, has not been eating.  He spit it out his medications. He also had a cough, nonproductive.  Does not seem to have any shortness of breath. Family states that he has some chills, low-grade fever. Still has a rectal tube, very little draining.   Objective: Vitals:   08/25/20 2345  08/26/20 0030 08/26/20 0518 08/26/20 0749  BP: (!) 81/53 (!) 93/59 108/66 100/63  Pulse: 96 100 98 98  Resp: 16 17 16 16   Temp: 99.3 F (37.4 C) 97.9 F (36.6 C) 99.1 F (37.3 C) 98.8 F (37.1 C)  TempSrc:  Axillary Axillary Axillary  SpO2: 100% 100% 99% 100%  Weight:      Height:        Intake/Output Summary (Last 24 hours) at 08/26/2020 0917 Last data filed at 08/25/2020 1900 Gross per 24 hour  Intake 3517.05 ml  Output --  Net 3517.05 ml   Filed Weights   08/21/20 1742  Weight: 48.5 kg    Examination:  General exam: Appears calm and comfortable  Respiratory system: Clear to auscultation. Respiratory effort normal. Cardiovascular system: S1 & S2 heard, RRR. No JVD, murmurs, rubs, gallops or clicks. No pedal edema. Gastrointestinal system: Abdomen is nondistended, soft and nontender. No organomegaly or masses felt.  Decreased bowel sounds Central nervous system: Drowsy and confused.  Extremities: Symmetric 5 x 5 power. Skin: No rashes, lesions or ulcers      Data Reviewed: I have personally reviewed following labs and imaging studies  CBC: Recent Labs  Lab 08/21/20 1746 08/22/20 0801 08/23/20 0343 08/24/20 0557 08/26/20 0549  WBC 13.5* 13.7* 9.5 4.7 20.7*  NEUTROABS  --   --  6.4 2.2 16.7*  HGB 13.7 13.4 11.8* 10.3* 11.7*  HCT 41.4 40.0 35.1* 31.9* 34.1*  MCV 89.4 89.1 89.1 91.7 86.8  PLT 312 295 259 237 268   Basic Metabolic Panel: Recent Labs  Lab 08/21/20 1746 08/21/20 2159 08/22/20 0719 08/23/20 0343 08/24/20 0557 08/25/20 0340 08/25/20 0433 08/26/20 0549  NA   < >  --  145 146* 142  --  138 134*  K   < >  --  3.4* 3.7 3.3*  --  3.1* 3.7  CL   < >  --  108 111 109  --  107 101  CO2   < >  --  25 26 26   --  26 25  GLUCOSE   < >  --  161* 98 104*  --  120* 134*  BUN   < >  --  14 17 13   --  7 8  CREATININE   < >  --  0.71 0.75 0.72  --  0.52* 0.83  CALCIUM   < >  --  8.6* 8.4* 8.0*  --  7.9* 8.2*  MG  --  1.7  --  2.1 1.7  --  1.6* 1.6*   PHOS  --   --   --   --   --  2.8  --   --    < > = values in this interval not displayed.   GFR: Estimated Creatinine Clearance: 86 mL/min (by C-G formula based on SCr of 0.83 mg/dL). Liver Function Tests: Recent Labs  Lab 08/21/20 1746  AST 30  ALT 48*  ALKPHOS 97  BILITOT 1.1  PROT 8.5*  ALBUMIN 4.1   Recent Labs  Lab 08/21/20 1746  LIPASE 18   No results for input(s): AMMONIA in the last  168 hours. Coagulation Profile: No results for input(s): INR, PROTIME in the last 168 hours. Cardiac Enzymes: No results for input(s): CKTOTAL, CKMB, CKMBINDEX, TROPONINI in the last 168 hours. BNP (last 3 results) No results for input(s): PROBNP in the last 8760 hours. HbA1C: No results for input(s): HGBA1C in the last 72 hours. CBG: No results for input(s): GLUCAP in the last 168 hours. Lipid Profile: No results for input(s): CHOL, HDL, LDLCALC, TRIG, CHOLHDL, LDLDIRECT in the last 72 hours. Thyroid Function Tests: No results for input(s): TSH, T4TOTAL, FREET4, T3FREE, THYROIDAB in the last 72 hours. Anemia Panel: Recent Labs    08/24/20 0557 08/25/20 0340  VITAMINB12 864  --   FERRITIN  --  64  TIBC  --  200*  IRON  --  85   Sepsis Labs: Recent Labs  Lab 08/21/20 1919 08/26/20 0549  PROCALCITON  --  0.94  LATICACIDVEN 1.8  --     Recent Results (from the past 240 hour(s))  Resp Panel by RT-PCR (Flu A&B, Covid) Nasopharyngeal Swab     Status: None   Collection Time: 08/21/20  9:33 PM   Specimen: Nasopharyngeal Swab; Nasopharyngeal(NP) swabs in vial transport medium  Result Value Ref Range Status   SARS Coronavirus 2 by RT PCR NEGATIVE NEGATIVE Final    Comment: (NOTE) SARS-CoV-2 target nucleic acids are NOT DETECTED.  The SARS-CoV-2 RNA is generally detectable in upper respiratory specimens during the acute phase of infection. The lowest concentration of SARS-CoV-2 viral copies this assay can detect is 138 copies/mL. A negative result does not preclude  SARS-Cov-2 infection and should not be used as the sole basis for treatment or other patient management decisions. A negative result may occur with  improper specimen collection/handling, submission of specimen other than nasopharyngeal swab, presence of viral mutation(s) within the areas targeted by this assay, and inadequate number of viral copies(<138 copies/mL). A negative result must be combined with clinical observations, patient history, and epidemiological information. The expected result is Negative.  Fact Sheet for Patients:  BloggerCourse.comhttps://www.fda.gov/media/152166/download  Fact Sheet for Healthcare Providers:  SeriousBroker.ithttps://www.fda.gov/media/152162/download  This test is no t yet approved or cleared by the [place] FDA and  has been authorized for detection and/or diagnosis of SARS-CoV-2 by FDA under an Emergency Use Authorization (EUA). This EUA will remain  in effect (meaning this test can be used) for the duration of the COVID-19 declaration under Section 564(b)(1) of the Act, 21 U.S.C.section 360bbb-3(b)(1), unless the authorization is terminated  or revoked sooner.       Influenza A by PCR NEGATIVE NEGATIVE Final   Influenza B by PCR NEGATIVE NEGATIVE Final    Comment: (NOTE) The Xpert Xpress SARS-CoV-2/FLU/RSV plus assay is intended as an aid in the diagnosis of influenza from Nasopharyngeal swab specimens and should not be used as a sole basis for treatment. Nasal washings and aspirates are unacceptable for Xpert Xpress SARS-CoV-2/FLU/RSV testing.  Fact Sheet for Patients: BloggerCourse.comhttps://www.fda.gov/media/152166/download  Fact Sheet for Healthcare Providers: SeriousBroker.ithttps://www.fda.gov/media/152162/download  This test is not yet approved or cleared by the [place] FDA and has been authorized for detection and/or diagnosis of SARS-CoV-2 by FDA under an Emergency Use Authorization (EUA). This EUA will remain in effect (meaning this test can be used) for the duration of  the COVID-19 declaration under Section 564(b)(1) of the Act, 21 U.S.C. section 360bbb-3(b)(1), unless the authorization is terminated or revoked.  Performed at [facility], [address] [place], [place] [id number]   Urine Culture  Status: Abnormal (Preliminary result)   Collection Time: 08/24/20  7:00 PM   Specimen: Urine, Random  Result Value Ref Range Status   Specimen Description   Final    URINE, RANDOM Performed at [facility], [address], [place], [place] [address]    Special Requests   Final    NONE Performed at [facility], [address] [place], [place] [address]    Culture 70,000 COLONIES/mL GRAM NEGATIVE RODS (A)  Final   Report Status PENDING  Incomplete         Radiology Studies: DG Chest 2 View  Result Date: 08/26/2020 CLINICAL DATA:  Evaluate for pneumonia EXAM: CHEST - 2 VIEW COMPARISON:  04/14/2020 FINDINGS: Heart and mediastinal contours are within normal limits. No focal opacities or effusions. No acute bony abnormality. IMPRESSION: No active cardiopulmonary disease. Electronically Signed   By: Charlett Nose M.D.   On: 08/26/2020 09:08   DG Abd 1 View  Result Date: 08/26/2020 CLINICAL DATA:  Evaluate for ileus EXAM: ABDOMEN - 1 VIEW COMPARISON:  08/25/2020 FINDINGS: Gaseous distention of the colon again noted with further decreased since prior study. Large stool burden noted within the rectosigmoid colon. No organomegaly or free air. No suspicious calcification. IMPRESSION: Mild gaseous distention of colon suggests ileus, improved since prior study. Large stool burden in the rectosigmoid colon. Electronically Signed   By: Charlett Nose M.D.   On: 08/26/2020 09:08   DG Abd 1 View  Result Date: 08/25/2020 CLINICAL DATA:  Ileus, abdominal distension, poor oral intake, severe autism EXAM: ABDOMEN - 1 VIEW COMPARISON:  Portable exam 0805 hours compared to 08/23/2020 FINDINGS: Gaseous distention of colon, decreased. Small  amount of scattered stool in LEFT abdomen. Air-filled normal and upper normal caliber small bowel loops. Findings favor ileus. No bowel wall thickening, urinary tract calcifications, or osseous findings. IMPRESSION: Gaseous distention of colon consistent with ileus, decreased from previous exam. Electronically Signed   By: Ulyses Southward M.D.   On: 08/25/2020 08:22        Scheduled Meds: . enoxaparin (LOVENOX) injection  40 mg Subcutaneous Q24H  . felbamate  900 mg Oral Q12H  . haloperidol  2 mg Oral Daily  . linaclotide  290 mcg Oral Daily  .  morphine injection  1 mg Intravenous Once  . pantoprazole sodium  40 mg Oral BID  . sennosides  15 mL Oral BID   Continuous Infusions: . sodium chloride    . dextrose 5 % and 0.45% NaCl 125 mL/hr at 08/26/20 0510  . magnesium sulfate bolus IVPB 2 g (08/26/20 2330)   Followed by  . magnesium sulfate bolus IVPB    . piperacillin-tazobactam       LOS: 4 days    Time spent: 35 minutes    Marrion Coy, MD [facility]   To contact the attending provider between 7A-7P or the covering provider during after hours 7P-7A, please log into the web site [email or web address] and access using universal [place] password for that web site. If you do not have the password, please call the hospital operator.  08/26/2020, 9:17 AM

## 2020-08-26 NOTE — Progress Notes (Signed)
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    GOAL:   Patient will meet greater than or equal to 90% of their needs    MONITOR:   Weight trends, Labs, Diet advancement, Supplement acceptance, PO intake, I & O's  REASON FOR ASSESSMENT:   Other (Comment) (low BMI)    ASSESSMENT:   RD working remotely.  34 year old male with history significant for severe autism, nonverbal at baseline, GERD presented with abdominal distention, poor intake and constipation over the last couple of days. Pt admitted with colonic ileus with Ogilvie syndrome.  12/3-admit  Unable to contact pt via phone to obtain nutrition history at this time, noted autism spectrum disorder with language impairment and intellectual disability. Per notes, general surgery recommended medical management. Rectal tube was placed for decompression per GI and diet advanced to clears on 12/5. He has consumed 0% x 2 documented intakes on 12/6, however MD noted pt less sleepy yesterday afternoon and taking in small amounts of liquids. Noted tolerating some liquids today, rectal tube in place filled with air, no stool in it with improved abdominal distention. Plans to slowly advance diet as tolerated per GI. Pt noted not eating well prior to admission, if unable to advance diet and po intake remains poor over the next 48-72 hours, recommend consideration of nutrition support. Will order Boost Breeze supplement to aid with meeting needs and will provide  Ensure supplement with diet advancement as appropriate. Pt is at high risk for refeeding given NPO/CL since admission as well as electrolyte abnormalities per labs. Recommend continuing to monitor magnesium, potassium, and phosphorus daily as po intake improves and labs normal.   Limited recent weight history for review, per chart weights appear stable 54.4 kg (120 lbs) from 07/2016-07/2019, on 04/16/20 weights had trended down to 47.3 kg (104.06 lbs), and currently pt weighs 48.5 kg (106.7 lbs). Weights have decreased ~13 lbs (10.8%) in the last 13 months, which is insignificant for time frame however given pt is underweight as well as past medical history, suspect degree of malnutrition. Will plan to complete exam at follow-up.   12/6-KUB performed showed improved colon distention 12/8-outpt EGD scheduled  I/Os: +4965 ml since admit  Medications reviewed and include: Felbamate, Linzess, Protonix 40 mg powder packets twice daily, Senokot, Zosyn IV 3.375 g every 8 hrs Mg sulfate IV 1 g once D5 in lactated ringers @ 125 ml/hr (510 kcal)  Labs: Na 134 (L), K 3.7 (WNL), Mg 1.6 (L), WBC 20.7 (H) 12/6 P 2.8 (WNL)   NUTRITION - FOCUSED PHYSICAL EXAM: Unable to complete at this time, RD working remotely.  Diet Order:   Diet Order            Diet clear liquid Room service appropriate? Yes; Fluid consistency: Thin  Diet effective now                 EDUCATION NEEDS:   Not appropriate for education at this time  Skin:  Skin Assessment: Reviewed RN Assessment  Last  BM:  12/4 (rectal tube)  Height:   Ht Readings from Last 1 Encounters:  08/21/20 5\' 5"  (1.651 m)    Weight:   Wt Readings from Last 1 Encounters:  08/21/20 48.5 kg    BMI:  Body mass index is 17.81 kg/m.  Estimated Nutritional Needs:   Kcal:  1500-1700  Protein:  75-85  Fluid:  >/= 1.5 L   14/02/21, RD, LDN Clinical Nutrition After Hours/Weekend Pager # in Amion

## 2020-08-27 ENCOUNTER — Ambulatory Visit: Admission: RE | Admit: 2020-08-27 | Payer: Medicaid Other | Source: Home / Self Care | Admitting: Gastroenterology

## 2020-08-27 ENCOUNTER — Encounter: Admission: RE | Payer: Self-pay | Source: Home / Self Care

## 2020-08-27 LAB — CBC WITH DIFFERENTIAL/PLATELET
Abs Immature Granulocytes: 1.11 10*3/uL — ABNORMAL HIGH (ref 0.00–0.07)
Basophils Absolute: 0.1 10*3/uL (ref 0.0–0.1)
Basophils Relative: 0 %
Eosinophils Absolute: 0 10*3/uL (ref 0.0–0.5)
Eosinophils Relative: 0 %
HCT: 31.8 % — ABNORMAL LOW (ref 39.0–52.0)
Hemoglobin: 11 g/dL — ABNORMAL LOW (ref 13.0–17.0)
Immature Granulocytes: 7 %
Lymphocytes Relative: 9 %
Lymphs Abs: 1.6 10*3/uL (ref 0.7–4.0)
MCH: 30.1 pg (ref 26.0–34.0)
MCHC: 34.6 g/dL (ref 30.0–36.0)
MCV: 86.9 fL (ref 80.0–100.0)
Monocytes Absolute: 2.1 10*3/uL — ABNORMAL HIGH (ref 0.1–1.0)
Monocytes Relative: 12 %
Neutro Abs: 12.2 10*3/uL — ABNORMAL HIGH (ref 1.7–7.7)
Neutrophils Relative %: 72 %
Platelets: 233 10*3/uL (ref 150–400)
RBC: 3.66 MIL/uL — ABNORMAL LOW (ref 4.22–5.81)
RDW: 14.6 % (ref 11.5–15.5)
Smear Review: NORMAL
WBC: 17.1 10*3/uL — ABNORMAL HIGH (ref 4.0–10.5)
nRBC: 0 % (ref 0.0–0.2)

## 2020-08-27 LAB — URINE CULTURE: Culture: 70000 — AB

## 2020-08-27 LAB — BASIC METABOLIC PANEL
Anion gap: 10 (ref 5–15)
BUN: 14 mg/dL (ref 6–20)
CO2: 26 mmol/L (ref 22–32)
Calcium: 8.1 mg/dL — ABNORMAL LOW (ref 8.9–10.3)
Chloride: 102 mmol/L (ref 98–111)
Creatinine, Ser: 0.82 mg/dL (ref 0.61–1.24)
GFR, Estimated: >60 mL/min (ref >60)
Glucose, Bld: 104 mg/dL — ABNORMAL HIGH (ref 70–99)
Potassium: 3.4 mmol/L — ABNORMAL LOW (ref 3.5–5.1)
Sodium: 138 mmol/L (ref 135–145)

## 2020-08-27 LAB — MAGNESIUM: Magnesium: 2 mg/dL (ref 1.7–2.4)

## 2020-08-27 LAB — PROCALCITONIN: Procalcitonin: 2.74 ng/mL

## 2020-08-27 SURGERY — ESOPHAGOGASTRODUODENOSCOPY (EGD) WITH PROPOFOL
Anesthesia: General

## 2020-08-27 MED ORDER — KCL IN DEXTROSE-NACL 40-5-0.9 MEQ/L-%-% IV SOLN
INTRAVENOUS | Status: DC
  Filled 2020-08-27 (×5): qty 1000

## 2020-08-27 MED ORDER — POTASSIUM CHLORIDE CRYS ER 20 MEQ PO TBCR: 40.0000 meq | EXTENDED_RELEASE_TABLET | Freq: Once | ORAL | Status: DC

## 2020-08-27 MED ORDER — PANTOPRAZOLE SODIUM 40 MG IV SOLR
40.0000 mg | Freq: Two times a day (BID) | INTRAVENOUS | Status: DC
  Administered 2020-08-27 (×2): 40 mg via INTRAVENOUS
  Filled 2020-08-27 (×3): qty 40

## 2020-08-27 MED ORDER — FLEET ENEMA 7-19 GM/118ML RE ENEM
1.0000 | ENEMA | Freq: Once | RECTAL | Status: AC
  Administered 2020-08-27: 1 via RECTAL

## 2020-08-27 MED ORDER — BISACODYL 10 MG RE SUPP
10.0000 mg | Freq: Once | RECTAL | Status: AC
  Administered 2020-08-27: 10 mg via RECTAL
  Filled 2020-08-27: qty 1

## 2020-08-27 NOTE — Plan of Care (Signed)
  Problem: Clinical Measurements: Goal: Diagnostic test results will improve Outcome: Not Progressing   Problem: Activity: Goal: Risk for activity intolerance will decrease Outcome: Not Progressing   Problem: Nutrition: Goal: Adequate nutrition will be maintained Outcome: Not Progressing   Problem: Elimination: Goal: Will not experience complications related to bowel motility Outcome: Not Progressing

## 2020-08-27 NOTE — Progress Notes (Addendum)
Progress Note    Jonathan Kirby  HVF:[id number] DOB: 06-05-1986  DOA: 08/21/2020 PCP: Almetta Lovely, [facility]      Brief Narrative:    Medical records reviewed and are as summarized below:  Jonathan Kirby is a 34 y.o. male       Assessment/Plan:   Principal Problem:   Abdominal pain Active Problems:   Autism spectrum disorder with accompanying language impairment and intellectual disability, requiring very substantial support   Constipation   Decreased oral intake   Hypokalemia   Hypomagnesemia   Ogilvie syndrome   Acute metabolic encephalopathy   Aspiration pneumonia of both lower lobes due to gastric secretions (HCC)   Severe protein-calorie malnutrition Jonathan Kirby: less than 60% of standard weight) (HCC)   Nutrition Problem: Inadequate oral intake Etiology: acute illness (colonic ileus)  Signs/Symptoms: meal completion < 25%   Body mass index is 17.81 kg/m.  (Underweight)   Probable aspiration pneumonia, leukocytosis: Continue IV Zosyn.  Colonic ileus with Ogilvie syndrome, constipation: Dulcolax as needed for constipation.  Rectal tube in place.  Plan was discussed with Dr. Allegra Lai, gastroenterologist, at the bedside.   Poor oral intake, reflux/vomiting, history of erosive esophagitis: Treat with IV Protonix and IV fluids.  Plan for EGD tomorrow.  Autism intellectual disability: Continue supportive care  Hypokalemia: Replete potassium intravenously  Hyponatremia and hyponatremia: Improved  Plan of care was discussed with his mother and father at the bedside.    Diet Order            Diet clear liquid Room service appropriate? Yes; Fluid consistency: Thin  Diet effective now                    Consultants:  Gastroenterologist  Procedures:  Plan for EGD tomorrow    Medications:   . enoxaparin (LOVENOX) injection  40 mg Subcutaneous Q24H  . feeding supplement  1 Container Oral TID BM  . felbamate  900 mg Oral Q12H  .  haloperidol  2 mg Oral Daily  . linaclotide  290 mcg Oral Daily  .  morphine injection  1 mg Intravenous Once  . pantoprazole (PROTONIX) IV  40 mg Intravenous Q12H  . sennosides  15 mL Oral BID   Continuous Infusions: . sodium chloride    . dextrose 5 % and 0.9 % NaCl with KCl 40 mEq/L 75 mL/hr at 08/27/20 1236  . piperacillin-tazobactam 3.375 g (08/27/20 1132)     Anti-infectives (From admission, onward)   Start     Dose/Rate Route Frequency Ordered Stop   08/26/20 0815  piperacillin-tazobactam (ZOSYN) IVPB 3.375 g        3.375 g 12.5 mL/hr over 240 Minutes Intravenous Every 8 hours 08/26/20 0806               Family Communication/Anticipated D/C date and plan/Code Status   DVT prophylaxis: enoxaparin (LOVENOX) injection 40 mg Start: 08/21/20 2200     Code Status: Full Code  Family Communication: Plan discussed with his parents at the bedside Disposition Plan:    Status is: Inpatient  Remains inpatient appropriate because:Ongoing diagnostic testing needed not appropriate for outpatient work up and IV treatments appropriate due to intensity of illness or inability to take PO   Dispo: The patient is from: Home              Anticipated d/c is to: Home              Anticipated d/c date  is: 2 days              Patient currently is not medically stable to d/c.           Subjective:   Interval events noted.  Patient is unable to provide any history.  His parents are at the bedside.  His mother said he has been spitting up a lot and is unable to swallow any liquids or medications  Objective:    Vitals:   08/27/20 0409 08/27/20 0738 08/27/20 1130 08/27/20 1603  BP: 124/82 109/86 121/87 117/79  Pulse: 81 62 78 75  Resp: 18 16 16 16   Temp: 98.1 F (36.7 C) 98.7 F (37.1 C) 99 F (37.2 C) 97.9 F (36.6 C)  TempSrc:   Oral   SpO2: 99% 98% 100% 100%  Weight:      Height:       No data found.   Intake/Output Summary (Last 24 hours) at 08/27/2020  1623 Last data filed at 08/27/2020 1607 Gross per 24 hour  Intake 351.16 ml  Output 0 ml  Net 351.16 ml   Filed Weights   08/21/20 1742  Weight: 48.5 kg    Exam:  GEN: NAD SKIN: Warm and dry EYES: Anicteric ENT: MMM CV: RRR PULM: CTA B ABD: soft, ND, NT, +BS CNS: Sleepy. Autistic and nonverbal EXT: No edema or tenderness   Data Reviewed:   I have personally reviewed following labs and imaging studies:  Labs: Labs show the following:   Basic Metabolic Panel: Recent Labs  Lab 08/23/20 0343 08/23/20 0343 08/24/20 0557 08/24/20 0557 08/25/20 0340 08/25/20 0433 08/25/20 0433 08/26/20 0549 08/27/20 0614  NA 146*  --  142  --   --  138  --  134* 138  K 3.7   < > 3.3*   < >  --  3.1*   < > 3.7 3.4*  CL 111  --  109  --   --  107  --  101 102  CO2 26  --  26  --   --  26  --  25 26  GLUCOSE 98  --  104*  --   --  120*  --  134* 104*  BUN 17  --  13  --   --  7  --  8 14  CREATININE 0.75  --  0.72  --   --  0.52*  --  0.83 0.82  CALCIUM 8.4*  --  8.0*  --   --  7.9*  --  8.2* 8.1*  MG 2.1  --  1.7  --   --  1.6*  --  1.6* 2.0  PHOS  --   --   --   --  2.8  --   --   --   --    < > = values in this interval not displayed.   GFR Estimated Creatinine Clearance: 87.1 mL/min (by C-G formula based on SCr of 0.82 mg/dL). Liver Function Tests: Recent Labs  Lab 08/21/20 1746  AST 30  ALT 48*  ALKPHOS 97  BILITOT 1.1  PROT 8.5*  ALBUMIN 4.1   Recent Labs  Lab 08/21/20 1746  LIPASE 18   No results for input(s): AMMONIA in the last 168 hours. Coagulation profile No results for input(s): INR, PROTIME in the last 168 hours.  CBC: Recent Labs  Lab 08/22/20 0801 08/23/20 0343 08/24/20 0557 08/26/20 0549 08/27/20 0614  WBC 13.7* 9.5 4.7 20.7* 17.1*  NEUTROABS  --  6.4 2.2 16.7* 12.2*  HGB 13.4 11.8* 10.3* 11.7* 11.0*  HCT 40.0 35.1* 31.9* 34.1* 31.8*  MCV 89.1 89.1 91.7 86.8 86.9  PLT 295 259 237 268 233   Cardiac Enzymes: No results for input(s):  CKTOTAL, CKMB, CKMBINDEX, TROPONINI in the last 168 hours. BNP (last 3 results) No results for input(s): PROBNP in the last 8760 hours. CBG: No results for input(s): GLUCAP in the last 168 hours. D-Dimer: No results for input(s): DDIMER in the last 72 hours. Hgb A1c: No results for input(s): HGBA1C in the last 72 hours. Lipid Profile: No results for input(s): CHOL, HDL, LDLCALC, TRIG, CHOLHDL, LDLDIRECT in the last 72 hours. Thyroid function studies: No results for input(s): TSH, T4TOTAL, T3FREE, THYROIDAB in the last 72 hours.  Invalid input(s): FREET3 Anemia work up: Recent Labs    08/25/20 0340  FERRITIN 64  TIBC 200*  IRON 85   Sepsis Labs: Recent Labs  Lab 08/21/20 1919 08/22/20 0801 08/23/20 0343 08/24/20 0557 08/26/20 0549 08/27/20 0614  PROCALCITON  --   --   --   --  0.94 2.74  WBC  --    < > 9.5 4.7 20.7* 17.1*  LATICACIDVEN 1.8  --   --   --   --   --    < > = values in this interval not displayed.    Microbiology Recent Results (from the past 240 hour(s))  Resp Panel by RT-PCR (Flu A&B, Covid) Nasopharyngeal Swab     Status: None   Collection Time: 08/21/20  9:33 PM   Specimen: Nasopharyngeal Swab; Nasopharyngeal(NP) swabs in vial transport medium  Result Value Ref Range Status   SARS Coronavirus 2 by RT PCR NEGATIVE NEGATIVE Final    Comment: (NOTE) SARS-CoV-2 target nucleic acids are NOT DETECTED.  The SARS-CoV-2 RNA is generally detectable in upper respiratory specimens during the acute phase of infection. The lowest concentration of SARS-CoV-2 viral copies this assay can detect is 138 copies/mL. A negative result does not preclude SARS-Cov-2 infection and should not be used as the sole basis for treatment or other patient management decisions. A negative result may occur with  improper specimen collection/handling, submission of specimen other than nasopharyngeal swab, presence of viral mutation(s) within the areas targeted by this assay, and  inadequate number of viral copies(<138 copies/mL). A negative result must be combined with clinical observations, patient history, and epidemiological information. The expected result is Negative.  Fact Sheet for Patients:  BloggerCourse.comhttps://www.fda.gov/media/152166/download  Fact Sheet for Healthcare Providers:  SeriousBroker.ithttps://www.fda.gov/media/152162/download  This test is no t yet approved or cleared by the [place] FDA and  has been authorized for detection and/or diagnosis of SARS-CoV-2 by FDA under an Emergency Use Authorization (EUA). This EUA will remain  in effect (meaning this test can be used) for the duration of the COVID-19 declaration under Section 564(b)(1) of the Act, 21 U.S.C.section 360bbb-3(b)(1), unless the authorization is terminated  or revoked sooner.       Influenza A by PCR NEGATIVE NEGATIVE Final   Influenza B by PCR NEGATIVE NEGATIVE Final    Comment: (NOTE) The Xpert Xpress SARS-CoV-2/FLU/RSV plus assay is intended as an aid in the diagnosis of influenza from Nasopharyngeal swab specimens and should not be used as a sole basis for treatment. Nasal washings and aspirates are unacceptable for Xpert Xpress SARS-CoV-2/FLU/RSV testing.  Fact Sheet for Patients: BloggerCourse.comhttps://www.fda.gov/media/152166/download  Fact Sheet for Healthcare Providers: SeriousBroker.ithttps://www.fda.gov/media/152162/download  This test is not yet approved or cleared by  the [employer] and has been authorized for detection and/or diagnosis of SARS-CoV-2 by FDA under an Emergency Use Authorization (EUA). This EUA will remain in effect (meaning this test can be used) for the duration of the COVID-19 declaration under Section 564(b)(1) of the Act, 21 U.S.C. section 360bbb-3(b)(1), unless the authorization is terminated or revoked.  Performed at [facility], [address] [place], [place] [address]   Urine Culture     Status: Abnormal   Collection Time: 08/24/20  7:00 PM   Specimen:  Urine, Random  Result Value Ref Range Status   Specimen Description   Final    URINE, RANDOM Performed at [facility], [address] [place], [place] [address]    Special Requests   Final    NONE Performed at [facility], [address] [place], [place] [address]    Culture 70,000 COLONIES/mL ESCHERICHIA COLI (A)  Final   Report Status 08/27/2020 FINAL  Final   Organism ID, Bacteria ESCHERICHIA COLI (A)  Final      Susceptibility   Escherichia coli - MIC*    AMPICILLIN >=32 RESISTANT Resistant     CEFAZOLIN <=4 SENSITIVE Sensitive     CEFEPIME <=0.12 SENSITIVE Sensitive     CEFTRIAXONE <=0.25 SENSITIVE Sensitive     CIPROFLOXACIN <=0.25 SENSITIVE Sensitive     GENTAMICIN <=1 SENSITIVE Sensitive     IMIPENEM <=0.25 SENSITIVE Sensitive     NITROFURANTOIN <=16 SENSITIVE Sensitive     TRIMETH/SULFA >=320 RESISTANT Resistant     AMPICILLIN/SULBACTAM 16 INTERMEDIATE Intermediate     PIP/TAZO <=4 SENSITIVE Sensitive     * 70,000 COLONIES/mL ESCHERICHIA COLI    Procedures and diagnostic studies:  DG Chest 2 View  Result Date: 08/26/2020 CLINICAL DATA:  Evaluate for pneumonia EXAM: CHEST - 2 VIEW COMPARISON:  04/14/2020 FINDINGS: Heart and mediastinal contours are within normal limits. No focal opacities or effusions. No acute bony abnormality. IMPRESSION: No active cardiopulmonary disease. Electronically Signed   By: Charlett Nose M.D.   On: 08/26/2020 09:08   DG Abd 1 View  Result Date: 08/26/2020 CLINICAL DATA:  Evaluate for ileus EXAM: ABDOMEN - 1 VIEW COMPARISON:  08/25/2020 FINDINGS: Gaseous distention of the colon again noted with further decreased since prior study. Large stool burden noted within the rectosigmoid colon. No organomegaly or free air. No suspicious calcification. IMPRESSION: Mild gaseous distention of colon suggests ileus, improved since prior study. Large stool burden in the rectosigmoid colon. Electronically Signed   By: Charlett Nose M.D.    On: 08/26/2020 09:08               LOS: 5 days   Twylah Bennetts  [facility]   Pager on [email or web address]. If 7PM-7AM, please contact night-coverage at [email or web address]     08/27/2020, 4:23 PM

## 2020-08-27 NOTE — Progress Notes (Signed)
Jonathan Repress, MD [address]  [address]  [place], [place] [address]  Main: [phone]  Fax: [phone] Pager: [phone]   Subjective: Continues to have poor p.o. intake.  Patient's mom is bedside who is concerned about poor p.o. intake.  Patient attempts to have liquids and also p.o. medication but he is pooling in his mouth when attempting to swallow, patient's mom is concerned he might be experiencing regurgitation of acid.  Rectal tube in place, abdominal distention continues to improve   Objective: Vital signs in last 24 hours: Vitals:   08/26/20 2331 08/27/20 0409 08/27/20 0738 08/27/20 1130  BP: 111/76 124/82 109/86 121/87  Pulse: 95 81 62 78  Resp: 16 18 16 16   Temp: 98.8 F (37.1 C) 98.1 F (36.7 C) 98.7 F (37.1 C) 99 F (37.2 C)  TempSrc: Oral   Oral  SpO2: 96% 99% 98% 100%  Weight:      Height:       Weight change:   Intake/Output Summary (Last 24 hours) at 08/27/2020 1205 Last data filed at 08/27/2020 0945 Gross per 24 hour  Intake 53.17 ml  Output 0 ml  Net 53.17 ml     Exam: Heart:: Regular rate and rhythm or S1S2 present Lungs: Clear Abdomen: soft, nontender, normal bowel sounds   Lab Results: @LABTEST2 @ Micro Results: Recent Results (from the past 240 hour(s))  Resp Panel by RT-PCR (Flu A&B, Covid) Nasopharyngeal Swab     Status: None   Collection Time: 08/21/20  9:33 PM   Specimen: Nasopharyngeal Swab; Nasopharyngeal(NP) swabs in vial transport medium  Result Value Ref Range Status   SARS Coronavirus 2 by RT PCR NEGATIVE NEGATIVE Final    Comment: (NOTE) SARS-CoV-2 target nucleic acids are NOT DETECTED.  The SARS-CoV-2 RNA is generally detectable in upper respiratory specimens during the acute phase of infection. The lowest concentration of SARS-CoV-2 viral copies this assay can detect is 138 copies/mL. A negative result does not preclude SARS-Cov-2 infection and should not be used as the sole basis for treatment  or other patient management decisions. A negative result may occur with  improper specimen collection/handling, submission of specimen other than nasopharyngeal swab, presence of viral mutation(s) within the areas targeted by this assay, and inadequate number of viral copies(<138 copies/mL). A negative result must be combined with clinical observations, patient history, and epidemiological information. The expected result is Negative.  Fact Sheet for Patients:   Fact Sheet for Healthcare Providers:  14/02/21  This test is no t yet approved or cleared by the [employer] FDA and  has been authorized for detection and/or diagnosis of SARS-CoV-2 by FDA under an Emergency Use Authorization (EUA). This EUA will remain  in effect (meaning this test can be used) for the duration of the COVID-19 declaration under Section 564(b)(1) of the Act, 21 U.S.C.section 360bbb-3(b)(1), unless the authorization is terminated  or revoked sooner.       Influenza A by PCR NEGATIVE NEGATIVE Final   Influenza B by PCR NEGATIVE NEGATIVE Final    Comment: (NOTE) The Xpert Xpress SARS-CoV-2/FLU/RSV plus assay is intended as an aid in the diagnosis of influenza from Nasopharyngeal swab specimens and should not be used as a sole basis for treatment. Nasal washings and aspirates are unacceptable for Xpert Xpress SARS-CoV-2/FLU/RSV testing.  Fact Sheet for Patients: SeriousBroker.it  Fact Sheet for Healthcare Providers: [place]  This test is not yet approved or cleared by the [employer] and has been authorized  for detection and/or diagnosis of SARS-CoV-2 by FDA under an Emergency Use Authorization (EUA). This EUA will remain in effect (meaning this test can be used) for the duration of the COVID-19 declaration under Section 564(b)(1) of the Act, 21 U.S.C. section  360bbb-3(b)(1), unless the authorization is terminated or revoked.  Performed at [facility], [address] [place], [place] [address]   Urine Culture     Status: Abnormal   Collection Time: 08/24/20  7:00 PM   Specimen: Urine, Random  Result Value Ref Range Status   Specimen Description   Final    URINE, RANDOM Performed at [facility], [address] [place], [place] [address]    Special Requests   Final    NONE Performed at [facility], [address] [place], [place] [address]    Culture 70,000 COLONIES/mL ESCHERICHIA COLI (A)  Final   Report Status 08/27/2020 FINAL  Final   Organism ID, Bacteria ESCHERICHIA COLI (A)  Final      Susceptibility   Escherichia coli - MIC*    AMPICILLIN >=32 RESISTANT Resistant     CEFAZOLIN <=4 SENSITIVE Sensitive     CEFEPIME <=0.12 SENSITIVE Sensitive     CEFTRIAXONE <=0.25 SENSITIVE Sensitive     CIPROFLOXACIN <=0.25 SENSITIVE Sensitive     GENTAMICIN <=1 SENSITIVE Sensitive     IMIPENEM <=0.25 SENSITIVE Sensitive     NITROFURANTOIN <=16 SENSITIVE Sensitive     TRIMETH/SULFA >=320 RESISTANT Resistant     AMPICILLIN/SULBACTAM 16 INTERMEDIATE Intermediate     PIP/TAZO <=4 SENSITIVE Sensitive     * 70,000 COLONIES/mL ESCHERICHIA COLI   Studies/Results: DG Chest 2 View  Result Date: 08/26/2020 CLINICAL DATA:  Evaluate for pneumonia EXAM: CHEST - 2 VIEW COMPARISON:  04/14/2020 FINDINGS: Heart and mediastinal contours are within normal limits. No focal opacities or effusions. No acute bony abnormality. IMPRESSION: No active cardiopulmonary disease. Electronically Signed   By: Charlett Nose M.D.   On: 08/26/2020 09:08   DG Abd 1 View  Result Date: 08/26/2020 CLINICAL DATA:  Evaluate for ileus EXAM: ABDOMEN - 1 VIEW COMPARISON:  08/25/2020 FINDINGS: Gaseous distention of the colon again noted with further decreased since prior study. Large stool burden noted within the rectosigmoid colon. No organomegaly or  free air. No suspicious calcification. IMPRESSION: Mild gaseous distention of colon suggests ileus, improved since prior study. Large stool burden in the rectosigmoid colon. Electronically Signed   By: Charlett Nose M.D.   On: 08/26/2020 09:08   Medications:  I have reviewed the patient's current medications. Prior to Admission:  Medications Prior to Admission  Medication Sig Dispense Refill Last Dose  . felbamate (FELBATOL) 600 MG/5ML suspension Take 7.5 mL by mouth twice daily 474 mL 5 08/21/2020 at 2230  . haloperidol (HALDOL) 2 MG tablet Crush 1 tablet in place in a puree (Patient taking differently: as needed. Crush 1 tablet in place in a puree) 10 tablet 5 Past Month at PRN  . pantoprazole (PROTONIX) 40 MG tablet Take 1 tablet (40 mg total) by mouth 2 (two) times daily. 60 tablet 1 08/21/2020 at Unknown time  . Plecanatide (TRULANCE) 3 MG TABS Take 3 mg by mouth daily.   08/21/2020 at 0800  . Sennosides 25 MG TABS Take 1 tablet (25 mg total) by mouth in the morning and at bedtime. 120 tablet 3 08/20/2020 at 2230  . sodium phosphate (FLEET) 7-19 GM/118ML ENEM Place 1 enema rectally every three (3) days as needed. If no bowel  movement   prn at prn  . linaclotide (LINZESS) 290 MCG CAPS capsule Take 1 capsule (290 mcg total) by mouth daily. (Patient not taking: Reported on 08/21/2020) 30 capsule 3 Not Taking at Unknown time   Scheduled: . bisacodyl  10 mg Rectal Once  . enoxaparin (LOVENOX) injection  40 mg Subcutaneous Q24H  . feeding supplement  1 Container Oral TID BM  . felbamate  900 mg Oral Q12H  . haloperidol  2 mg Oral Daily  . linaclotide  290 mcg Oral Daily  .  morphine injection  1 mg Intravenous Once  . pantoprazole (PROTONIX) IV  40 mg Intravenous Q12H  . sennosides  15 mL Oral BID   Continuous: . sodium chloride    . dextrose 5 % and 0.9 % NaCl with KCl 40 mEq/L    . piperacillin-tazobactam 3.375 g (08/27/20 1132)   INO:[id number] **OR** ondansetron (ZOFRAN)  IV Anti-infectives (From admission, onward)   Start     Dose/Rate Route Frequency Ordered Stop   08/26/20 0815  piperacillin-tazobactam (ZOSYN) IVPB 3.375 g        3.375 g 12.5 mL/hr over 240 Minutes Intravenous Every 8 hours 08/26/20 0806       Scheduled Meds: . bisacodyl  10 mg Rectal Once  . enoxaparin (LOVENOX) injection  40 mg Subcutaneous Q24H  . feeding supplement  1 Container Oral TID BM  . felbamate  900 mg Oral Q12H  . haloperidol  2 mg Oral Daily  . linaclotide  290 mcg Oral Daily  .  morphine injection  1 mg Intravenous Once  . pantoprazole (PROTONIX) IV  40 mg Intravenous Q12H  . sennosides  15 mL Oral BID   Continuous Infusions: . sodium chloride    . dextrose 5 % and 0.9 % NaCl with KCl 40 mEq/L    . piperacillin-tazobactam 3.375 g (08/27/20 1132)   PRN Meds:.ondansetron **OR** ondansetron (ZOFRAN) IV   Assessment: Principal Problem:   Abdominal pain Active Problems:   Autism spectrum disorder with accompanying language impairment and intellectual disability, requiring very substantial support   Constipation   Decreased oral intake   Hypokalemia   Hypomagnesemia   Ogilvie syndrome   Acute metabolic encephalopathy   Aspiration pneumonia of both lower lobes due to gastric secretions (HCC)   Severe protein-calorie malnutrition [name]: less than 60% of standard weight) (HCC)    Plan: Ileus/Ogilvie's: Significant improvement in abdominal distention Rectal tube in place Not having any bowel movements X-ray abdomen revealed stool in rectosigmoid colon, will try suppository +/- enema  Decreased p.o. intake, history of erosive esophagitis We will switch p.o. Protonix to IV Protonix 40 twice daily Discussed about upper endoscopy to evaluate for any Candida esophagitis or esophageal stricture or erosive esophagitis, will plan to perform tomorrow.  If no etiology is identified, recommend speech pathology evaluation   I have discussed alternative options, risks  & benefits,  which include, but are not limited to, bleeding, infection, perforation,respiratory complication & drug reaction.  The patient agrees with this plan & written consent will be obtained.      LOS: 5 days   Twilla Khouri 08/27/2020, 12:05 PM

## 2020-08-28 ENCOUNTER — Inpatient Hospital Stay: Payer: Medicaid Other

## 2020-08-28 ENCOUNTER — Inpatient Hospital Stay: Payer: Medicaid Other | Admitting: Certified Registered"

## 2020-08-28 ENCOUNTER — Encounter
Admission: EM | Disposition: A | Discharge status: Home Health | Payer: Self-pay | Source: Home / Self Care | Attending: Internal Medicine

## 2020-08-28 ENCOUNTER — Encounter: Payer: Self-pay | Admitting: Internal Medicine

## 2020-08-28 DIAGNOSIS — E440 Moderate protein-calorie malnutrition: Secondary | ICD-10-CM | POA: Insufficient documentation

## 2020-08-28 DIAGNOSIS — R569 Unspecified convulsions: Secondary | ICD-10-CM

## 2020-08-28 DIAGNOSIS — G9341 Metabolic encephalopathy: Secondary | ICD-10-CM

## 2020-08-28 DIAGNOSIS — K2100 Gastro-esophageal reflux disease with esophagitis, without bleeding: Secondary | ICD-10-CM

## 2020-08-28 HISTORY — PX: ESOPHAGOGASTRODUODENOSCOPY (EGD) WITH PROPOFOL: SHX5813

## 2020-08-28 LAB — CBC WITH DIFFERENTIAL/PLATELET
Abs Immature Granulocytes: 0.14 10*3/uL — ABNORMAL HIGH (ref 0.00–0.07)
Basophils Absolute: 0 10*3/uL (ref 0.0–0.1)
Basophils Relative: 0 %
Eosinophils Absolute: 0 10*3/uL (ref 0.0–0.5)
Eosinophils Relative: 1 %
HCT: 30.9 % — ABNORMAL LOW (ref 39.0–52.0)
Hemoglobin: 10.4 g/dL — ABNORMAL LOW (ref 13.0–17.0)
Immature Granulocytes: 2 %
Lymphocytes Relative: 13 %
Lymphs Abs: 1.1 10*3/uL (ref 0.7–4.0)
MCH: 29.3 pg (ref 26.0–34.0)
MCHC: 33.7 g/dL (ref 30.0–36.0)
MCV: 87 fL (ref 80.0–100.0)
Monocytes Absolute: 0.8 10*3/uL (ref 0.1–1.0)
Monocytes Relative: 10 %
Neutro Abs: 6.6 10*3/uL (ref 1.7–7.7)
Neutrophils Relative %: 74 %
Platelets: 285 10*3/uL (ref 150–400)
RBC: 3.55 MIL/uL — ABNORMAL LOW (ref 4.22–5.81)
RDW: 14.6 % (ref 11.5–15.5)
Smear Review: NORMAL
WBC: 8.8 10*3/uL (ref 4.0–10.5)
nRBC: 0 % (ref 0.0–0.2)

## 2020-08-28 LAB — BASIC METABOLIC PANEL
Anion gap: 11 (ref 5–15)
BUN: 19 mg/dL (ref 6–20)
CO2: 27 mmol/L (ref 22–32)
Calcium: 8 mg/dL — ABNORMAL LOW (ref 8.9–10.3)
Chloride: 104 mmol/L (ref 98–111)
Creatinine, Ser: 0.72 mg/dL (ref 0.61–1.24)
GFR, Estimated: >60 mL/min (ref >60)
Glucose, Bld: 90 mg/dL (ref 70–99)
Potassium: 2.9 mmol/L — ABNORMAL LOW (ref 3.5–5.1)
Sodium: 142 mmol/L (ref 135–145)

## 2020-08-28 LAB — MAGNESIUM: Magnesium: 1.8 mg/dL (ref 1.7–2.4)

## 2020-08-28 LAB — PROCALCITONIN: Procalcitonin: 1.68 ng/mL

## 2020-08-28 LAB — PHOSPHORUS: Phosphorus: 3.6 mg/dL (ref 2.5–4.6)

## 2020-08-28 LAB — FOLATE: Folate: 21.2 ng/mL (ref >5.9)

## 2020-08-28 LAB — VITAMIN D 25 HYDROXY (VIT D DEFICIENCY, FRACTURES): Vit D, 25-Hydroxy: 48.09 ng/mL (ref 30–100)

## 2020-08-28 SURGERY — ESOPHAGOGASTRODUODENOSCOPY (EGD) WITH PROPOFOL
Anesthesia: General

## 2020-08-28 MED ORDER — SODIUM CHLORIDE 0.9 % IV SOLN: INTRAVENOUS | Status: DC

## 2020-08-28 MED ORDER — ADULT MULTIVITAMIN W/MINERALS CH: 1.0000 | ORAL_TABLET | Freq: Every day | ORAL | Status: DC

## 2020-08-28 MED ORDER — FELBAMATE 600 MG/5ML PO SUSP
900.0000 mg | Freq: Two times a day (BID) | ORAL | Status: DC
  Administered 2020-08-28 – 2020-09-01 (×9): 900 mg via ORAL
  Filled 2020-08-28 (×10): qty 7.5

## 2020-08-28 MED ORDER — PANTOPRAZOLE SODIUM 40 MG IV SOLR
40.0000 mg | Freq: Two times a day (BID) | INTRAVENOUS | Status: DC
  Administered 2020-08-28 – 2020-08-31 (×7): 40 mg via INTRAVENOUS
  Filled 2020-08-28 (×6): qty 40

## 2020-08-28 MED ORDER — PROPOFOL 10 MG/ML IV BOLUS
INTRAVENOUS | Status: DC | PRN
  Administered 2020-08-28: 40 mg via INTRAVENOUS

## 2020-08-28 MED ORDER — PRENATAL PLUS 27-1 MG PO TABS
1.0000 | ORAL_TABLET | Freq: Every day | ORAL | Status: DC
  Administered 2020-08-28: 1 via ORAL
  Filled 2020-08-28 (×3): qty 1

## 2020-08-28 MED ORDER — POTASSIUM CHLORIDE 10 MEQ/100ML IV SOLN
10.0000 meq | INTRAVENOUS | Status: AC
  Administered 2020-08-28 (×4): 10 meq via INTRAVENOUS
  Filled 2020-08-28 (×4): qty 100

## 2020-08-28 MED ORDER — PROPOFOL 500 MG/50ML IV EMUL
INTRAVENOUS | Status: DC | PRN
  Administered 2020-08-28: 175 ug/kg/min via INTRAVENOUS

## 2020-08-28 MED ORDER — FLEET ENEMA 7-19 GM/118ML RE ENEM
1.0000 | ENEMA | Freq: Once | RECTAL | Status: AC
  Administered 2020-08-28: 1 via RECTAL

## 2020-08-28 MED ORDER — ENSURE ENLIVE PO LIQD
237.0000 mL | Freq: Three times a day (TID) | ORAL | Status: DC
  Administered 2020-08-28 (×2): 237 mL via ORAL

## 2020-08-28 NOTE — Transfer of Care (Signed)
Immediate Anesthesia Transfer of Care Note  Patient: Jonathan Kirby  Procedure(s) Performed: ESOPHAGOGASTRODUODENOSCOPY (EGD) WITH PROPOFOL (N/A )  Patient Location: Endoscopy Unit  Anesthesia Type:General  Level of Consciousness: awake and alert   Airway & Oxygen Therapy: Patient Spontanous Breathing  Post-op Assessment: Report given to RN and Post -op Vital signs reviewed and stable  Post vital signs: Reviewed and stable  Last Vitals:  Vitals Value Taken Time  BP    Temp    Pulse    Resp    SpO2      Last Pain:  Vitals:   08/28/20 0616  TempSrc: Oral  PainSc:          Complications: No complications documented.

## 2020-08-28 NOTE — Procedures (Signed)
Patient Name: Jonathan Kirby  MRN: [id number]  Epilepsy Attending: Charlsie Quest  Referring Physician/Provider: Dr Lurene Shadow Date: 08/28/2020 Duration: 23.07 mins  Patient history: 34 year old male with autism, epilepsy with altered mental status.  EEG to evaluate for seizures.  Level of alertness: Awake, asleep  AEDs during EEG study: Felbamate  Technical aspects: This EEG study was done with scalp electrodes positioned according to the 10-20 International system of electrode placement. Electrical activity was acquired at a sampling rate of 500Hz  and reviewed with a high frequency filter of 70Hz  and a low frequency filter of 1Hz . EEG data were recorded continuously and digitally stored.   Description: No clear posterior dominant rhythm was seen. Sleep was characterized by vertex waves, sleep spindles (12 to 14 Hz), maximal frontocentral region.  EEG showed continuous generalized 3 to 5 Hz theta-delta slowing with overriding 15 to 18 Hz generalized beta activity.  Multifocal spikes were seen, in left frontotemporal, right frontotemporal and bilateral frontocentral region.  Hyperventilation and photic stimulation were not performed.     ABNORMALITY -Continuous slow, generalized -Spikes, left frontotemporal, right frontotemporal and bilateral frontocentral region  IMPRESSION: This study is consistent with patient's known history of epilepsy, which could be fragments of generalized epilepsy  or multifocal epilepsy.  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  Bode Pieper 

## 2020-08-28 NOTE — Anesthesia Preprocedure Evaluation (Signed)
Anesthesia Evaluation  Patient identified by MRN, date of birth, ID band Patient awake    Reviewed: Allergy & Precautions, H&P , NPO status , Patient's Chart, lab work & pertinent test results  History of Anesthesia Complications Negative for: history of anesthetic complications  Airway       Comment: Unable to comply with Mallampati exam Dental   Pulmonary neg pulmonary ROS, neg sleep apnea, neg COPD,    breath sounds clear to auscultation       Cardiovascular (-) angina(-) Past MI and (-) Cardiac Stents negative cardio ROS  (-) dysrhythmias  Rhythm:regular Rate:Normal     Neuro/Psych Seizures -,  Autism and developmental delay negative psych ROS   GI/Hepatic Neg liver ROS, GERD  ,  Endo/Other  negative endocrine ROS  Renal/GU negative Renal ROS  negative genitourinary   Musculoskeletal   Abdominal   Peds  Hematology negative hematology ROS (+)   Anesthesia Other Findings Admitted with abdominal distension and poor PO intake, now abdominal distension improved s/p rectal tube.  No vomiting.  NPO.  Past Medical History: No date: Autism No date: GERD (gastroesophageal reflux disease) No date: Seizures [facility])  Past Surgical History: 1987: CIRCUMCISION 04/17/2020: ESOPHAGOGASTRODUODENOSCOPY; N/A     Comment:  Procedure: ESOPHAGOGASTRODUODENOSCOPY (EGD);  Surgeon:               Pasty Spillers, MD;  Location: [facility] ENDOSCOPY;                Service: Endoscopy;  Laterality: N/A;  BMI    Body Mass Index: 17.79 kg/m      Reproductive/Obstetrics negative OB ROS                             Anesthesia Physical Anesthesia Plan  ASA: III  Anesthesia Plan: General   Post-op Pain Management:    Induction:   PONV Risk Score and Plan: Propofol infusion and TIVA  Airway Management Planned: Simple Face Mask  Additional Equipment:   Intra-op Plan:   Post-operative Plan:    Informed Consent: I have reviewed the patients History and Physical, chart, labs and discussed the procedure including the risks, benefits and alternatives for the proposed anesthesia with the patient or authorized representative who has indicated his/her understanding and acceptance.     Dental Advisory Given  Plan Discussed with: Anesthesiologist, CRNA and Surgeon  Anesthesia Plan Comments:         Anesthesia Quick Evaluation

## 2020-08-28 NOTE — Plan of Care (Signed)
  Problem: Clinical Measurements: Goal: Will remain free from infection Outcome: Progressing Goal: Respiratory complications will improve Outcome: Progressing Goal: Cardiovascular complication will be avoided Outcome: Progressing   Problem: Clinical Measurements: Goal: Diagnostic test results will improve Outcome: Not Progressing

## 2020-08-28 NOTE — Progress Notes (Signed)
Progress Note    Jonathan Kirby  IOM:[id number] DOB: 1986-06-05  DOA: 08/21/2020 PCP: Almetta Lovely, [facility]      Brief Narrative:    Medical records reviewed and are as summarized below:  Jonathan Kirby is a 34 y.o. male       Assessment/Plan:   Principal Problem:   Abdominal pain Active Problems:   Autism spectrum disorder with accompanying language impairment and intellectual disability, requiring very substantial support   Constipation   Decreased oral intake   Hypokalemia   Hypomagnesemia   Ogilvie syndrome   Acute metabolic encephalopathy   Aspiration pneumonia of both lower lobes due to gastric secretions (HCC)   Severe protein-calorie malnutrition [name]: less than 60% of standard weight) (HCC)   Malnutrition of moderate degree   Nutrition Problem: Moderate Malnutrition Etiology: social / environmental circumstances (autism, bedbound)  Signs/Symptoms: mild fat depletion,severe fat depletion,moderate muscle depletion,severe muscle depletion   Body mass index is 17.79 kg/m.  (Underweight)   Probable aspiration pneumonia, leukocytosis: Continue IV Zosyn.  Colonic ileus with Ogilvie syndrome, constipation: Dulcolax as needed for constipation.  Rectal tube in place.  Plan was discussed with Dr. Allegra Lai, gastroenterologist, at the bedside.   Poor oral intake, reflux/vomiting, history of erosive esophagitis: Treat with IV Protonix and IV fluids.  Plan for EGD tomorrow.  Autism intellectual disability: Continue supportive care  Hypokalemia: Replete potassium intravenously  Hyponatremia and hyponatremia: Improved  Plan of care was discussed with his mother and father at the bedside.    Diet Order            DIET SOFT Room service appropriate? Yes; Fluid consistency: Thin  Diet effective now                    Consultants:  Gastroenterologist  Procedures:  Plan for EGD tomorrow    Medications:   . enoxaparin (LOVENOX) injection   40 mg Subcutaneous Q24H  . feeding supplement  237 mL Oral TID BM  . felbamate  900 mg Oral Q12H  . haloperidol  2 mg Oral Daily  . linaclotide  290 mcg Oral Daily  .  morphine injection  1 mg Intravenous Once  . pantoprazole (PROTONIX) IV  40 mg Intravenous Q12H  . prenatal vitamin w/FE, FA  1 tablet Oral Daily  . sennosides  15 mL Oral BID   Continuous Infusions: . sodium chloride    . dextrose 5 % and 0.9 % NaCl with KCl 40 mEq/L 75 mL/hr at 08/28/20 1022  . piperacillin-tazobactam 3.375 g (08/28/20 1043)     Anti-infectives (From admission, onward)   Start     Dose/Rate Route Frequency Ordered Stop   08/26/20 0815  piperacillin-tazobactam (ZOSYN) IVPB 3.375 g        3.375 g 12.5 mL/hr over 240 Minutes Intravenous Every 8 hours 08/26/20 0806               Family Communication/Anticipated D/C date and plan/Code Status   DVT prophylaxis: enoxaparin (LOVENOX) injection 40 mg Start: 08/21/20 2200     Code Status: Full Code  Family Communication: Plan discussed with his parents at the bedside Disposition Plan:    Status is: Inpatient  Remains inpatient appropriate because:Ongoing diagnostic testing needed not appropriate for outpatient work up and IV treatments appropriate due to intensity of illness or inability to take PO   Dispo: The patient is from: Home  Anticipated d/c is to: Home              Anticipated d/c date is: 2 days              Patient currently is not medically stable to d/c.           Subjective:   Interval events noted.  Patient is unable to provide any history.  His parents are at the bedside.  His mother said he has been spitting up a lot and is unable to swallow any liquids or medications  Objective:    Vitals:   08/28/20 0920 08/28/20 1013 08/28/20 1212 08/28/20 1523  BP: 121/79 124/79 123/78 (!) 128/91  Pulse: 85 88 91 77  Resp: Temp:  98.1 F (36.7 C) 99.8 F (37.7 C) 99.2 F (37.3 C)   TempSrc:  Axillary Oral Oral  SpO2: 98% 99% 98% 99%  Weight:      Height:       No data found.   Intake/Output Summary (Last 24 hours) at 08/28/2020 1625 Last data filed at 08/28/2020 1510 Gross per 24 hour  Intake 1060 ml  Output -  Net 1060 ml   Filed Weights   08/21/20 1742 08/28/20 0723  Weight: 48.5 kg 48.5 kg    Exam:  GEN: NAD SKIN: Warm and dry EYES: Anicteric ENT: MMM CV: RRR PULM: CTA B ABD: soft, ND, NT, +BS CNS: Sleepy. Autistic and nonverbal EXT: No edema or tenderness   Data Reviewed:   I have personally reviewed following labs and imaging studies:  Labs: Labs show the following:   Basic Metabolic Panel: Recent Labs  Lab 08/24/20 0557 08/25/20 0340 08/25/20 0433 08/26/20 0549 08/27/20 0614 08/28/20 0547  NA 142  --  138 134* 138 142  K 3.3*  --  3.1* 3.7 3.4* 2.9*  CL 109  --  107 101 102 104  CO2 26  --  GLUCOSE 104*  --  120* 134* 104* 90  BUN 13  --  CREATININE 0.72  --  0.52* 0.83 0.82 0.72  CALCIUM 8.0*  --  7.9* 8.2* 8.1* 8.0*  MG 1.7  --  1.6* 1.6* 2.0 1.8  PHOS  --  2.8  --   --   --  3.6   GFR Estimated Creatinine Clearance: 89.3 mL/min (by C-G formula based on SCr of 0.72 mg/dL). Liver Function Tests: Recent Labs  Lab 08/21/20 1746  AST 30  ALT 48*  ALKPHOS 97  BILITOT 1.1  PROT 8.5*  ALBUMIN 4.1   Recent Labs  Lab 08/21/20 1746  LIPASE 18   No results for input(s): AMMONIA in the last 168 hours. Coagulation profile No results for input(s): INR, PROTIME in the last 168 hours.  CBC: Recent Labs  Lab 08/23/20 0343 08/24/20 0557 08/26/20 0549 08/27/20 0614 08/28/20 0547  WBC 9.5 4.7 20.7* 17.1* 8.8  NEUTROABS 6.4 2.2 16.7* 12.2* 6.6  HGB 11.8* 10.3* 11.7* 11.0* 10.4*  HCT 35.1* 31.9* 34.1* 31.8* 30.9*  MCV 89.1 91.7 86.8 86.9 87.0  PLT 259 237 268 233 285   Cardiac Enzymes: No results for input(s): CKTOTAL, CKMB, CKMBINDEX, TROPONINI in the last 168 hours. BNP (last 3  results) No results for input(s): PROBNP in the last 8760 hours. CBG: No results for input(s): GLUCAP in the last 168 hours. D-Dimer: No results for input(s): DDIMER in the last 72 hours. Hgb A1c:  No results for input(s): HGBA1C in the last 72 hours. Lipid Profile: No results for input(s): CHOL, HDL, LDLCALC, TRIG, CHOLHDL, LDLDIRECT in the last 72 hours. Thyroid function studies: No results for input(s): TSH, T4TOTAL, T3FREE, THYROIDAB in the last 72 hours.  Invalid input(s): FREET3 Anemia work up: Recent Labs    08/28/20 1305  FOLATE 21.2   Sepsis Labs: Recent Labs  Lab 08/21/20 1919 08/22/20 0801 08/24/20 0557 08/26/20 0549 08/27/20 0614 08/28/20 0547  PROCALCITON  --   --   --  0.94 2.74 1.68  WBC  --    < > 4.7 20.7* 17.1* 8.8  LATICACIDVEN 1.8  --   --   --   --   --    < > = values in this interval not displayed.    Microbiology Recent Results (from the past 240 hour(s))  Resp Panel by RT-PCR (Flu A&B, Covid) Nasopharyngeal Swab     Status: None   Collection Time: 08/21/20  9:33 PM   Specimen: Nasopharyngeal Swab; Nasopharyngeal(NP) swabs in vial transport medium  Result Value Ref Range Status   SARS Coronavirus 2 by RT PCR NEGATIVE NEGATIVE Final    Comment: (NOTE) SARS-CoV-2 target nucleic acids are NOT DETECTED.  The SARS-CoV-2 RNA is generally detectable in upper respiratory specimens during the acute phase of infection. The lowest concentration of SARS-CoV-2 viral copies this assay can detect is 138 copies/mL. A negative result does not preclude SARS-Cov-2 infection and should not be used as the sole basis for treatment or other patient management decisions. A negative result may occur with  improper specimen collection/handling, submission of specimen other than nasopharyngeal swab, presence of viral mutation(s) within the areas targeted by this assay, and inadequate number of viral copies(<138 copies/mL). A negative result must be combined  with clinical observations, patient history, and epidemiological information. The expected result is Negative.  Fact Sheet for Patients:  BloggerCourse.com  Fact Sheet for Healthcare Providers:  SeriousBroker.it  This test is no t yet approved or cleared by the [place] FDA and  has been authorized for detection and/or diagnosis of SARS-CoV-2 by FDA under an Emergency Use Authorization (EUA). This EUA will remain  in effect (meaning this test can be used) for the duration of the COVID-19 declaration under Section 564(b)(1) of the Act, 21 U.S.C.section 360bbb-3(b)(1), unless the authorization is terminated  or revoked sooner.       Influenza A by PCR NEGATIVE NEGATIVE Final   Influenza B by PCR NEGATIVE NEGATIVE Final    Comment: (NOTE) The Xpert Xpress SARS-CoV-2/FLU/RSV plus assay is intended as an aid in the diagnosis of influenza from Nasopharyngeal swab specimens and should not be used as a sole basis for treatment. Nasal washings and aspirates are unacceptable for Xpert Xpress SARS-CoV-2/FLU/RSV testing.  Fact Sheet for Patients: BloggerCourse.com  Fact Sheet for Healthcare Providers: SeriousBroker.it  This test is not yet approved or cleared by the [place] FDA and has been authorized for detection and/or diagnosis of SARS-CoV-2 by FDA under an Emergency Use Authorization (EUA). This EUA will remain in effect (meaning this test can be used) for the duration of the COVID-19 declaration under Section 564(b)(1) of the Act, 21 U.S.C. section 360bbb-3(b)(1), unless the authorization is terminated or revoked.  Performed at [facility], [address], [place], [place] [address]   Urine Culture     Status: Abnormal   Collection Time: 08/24/20  7:00 PM   Specimen: Urine, Random  Result Value Ref Range Status  Specimen Description   Final    URINE,  RANDOM Performed at [facility], [address] [place], [place] [address]    Special Requests   Final    NONE Performed at [facility], [address] [place], [place] [address]    Culture 70,000 COLONIES/mL ESCHERICHIA COLI (A)  Final   Report Status 08/27/2020 FINAL  Final   Organism ID, Bacteria ESCHERICHIA COLI (A)  Final      Susceptibility   Escherichia coli - MIC*    AMPICILLIN >=32 RESISTANT Resistant     CEFAZOLIN <=4 SENSITIVE Sensitive     CEFEPIME <=0.12 SENSITIVE Sensitive     CEFTRIAXONE <=0.25 SENSITIVE Sensitive     CIPROFLOXACIN <=0.25 SENSITIVE Sensitive     GENTAMICIN <=1 SENSITIVE Sensitive     IMIPENEM <=0.25 SENSITIVE Sensitive     NITROFURANTOIN <=16 SENSITIVE Sensitive     TRIMETH/SULFA >=320 RESISTANT Resistant     AMPICILLIN/SULBACTAM 16 INTERMEDIATE Intermediate     PIP/TAZO <=4 SENSITIVE Sensitive     * 70,000 COLONIES/mL ESCHERICHIA COLI    Procedures and diagnostic studies:  EEG  Result Date: 08/28/2020 Charlsie Quest, MD     08/28/2020  3:41 PM Patient Name: Jonathan Kirby MRN: [id number] Epilepsy Attending: Charlsie Quest Referring Physician/Provider: Dr Lurene Shadow Date: 08/28/2020 Duration: 23.07 mins Patient history: 34 year old male with autism, epilepsy with altered mental status.  EEG to evaluate for seizures. Level of alertness: Awake, asleep AEDs during EEG study: Felbamate Technical aspects: This EEG study was done with scalp electrodes positioned according to the 10-20 International system of electrode placement. Electrical activity was acquired at a sampling rate of 500Hz  and reviewed with a high frequency filter of 70Hz  and a low frequency filter of 1Hz . EEG data were recorded continuously and digitally stored. Description: No clear posterior dominant rhythm was seen. Sleep was characterized by vertex waves, sleep spindles (12 to 14 Hz), maximal frontocentral region.  EEG showed continuous generalized 3 to 5 Hz  theta-delta slowing with overriding 15 to 18 Hz generalized beta activity.  Multifocal spikes were seen, in left frontotemporal, right frontotemporal and bilateral frontocentral region.  Hyperventilation and photic stimulation were not performed.   ABNORMALITY -Continuous slow, generalized -Spikes, left frontotemporal, right frontotemporal and bilateral frontocentral region IMPRESSION: This study is consistent with patient's known history of epilepsy, which could be fragments of generalized epilepsy  or multifocal epilepsy.  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 Charlsie Quest   CT HEAD WO CONTRAST  Result Date: 08/28/2020 CLINICAL DATA:  34 year old male male with unexplained lethargy, autism. EXAM: CT HEAD WITHOUT CONTRAST TECHNIQUE: Contiguous axial images were obtained from the base of the skull through the vertex without intravenous contrast. COMPARISON:  None. FINDINGS: Brain: Cerebellar volume is below that expected for age. Brainstem volume also appears somewhat reduced. But supratentorial volume appears more normal. No midline shift, ventriculomegaly, mass effect, evidence of mass lesion, intracranial hemorrhage or evidence of cortically based acute infarction. Gray-white matter differentiation is within normal limits throughout the brain. Vascular: No suspicious intracranial vascular hyperdensity. Skull: Negative. Sinuses/Orbits: Hyperplastic paranasal sinuses, mastoid and sphenoid wing air cells are clear. Other: Visualized orbits and scalp soft tissues are within normal limits. IMPRESSION: 1.  No acute intracranial abnormality. 2. Nonspecific volume loss involving the cerebellum and possibly also the brainstem. Electronically Signed   By: Odessa Fleming M.D.   On: 08/28/2020 15:20  LOS: 6 days   Valentin Benney  [facility]   Pager on [email or web address]. If 7PM-7AM, please contact  night-coverage at [email or web address]     08/28/2020, 4:25 PM              Progress Note    Jonathan Kirby  ZOX:[id number] DOB: [birth year]  DOA: 08/21/2020 PCP: Almetta Lovely, [facility]      Brief Narrative:    Medical records reviewed and are as summarized below:  Jonathan Kirby is a 34 y.o. male       Assessment/Plan:   Principal Problem:   Abdominal pain Active Problems:   Autism spectrum disorder with accompanying language impairment and intellectual disability, requiring very substantial support   Constipation   Decreased oral intake   Hypokalemia   Hypomagnesemia   Ogilvie syndrome   Acute metabolic encephalopathy   Aspiration pneumonia of both lower lobes due to gastric secretions (HCC)   Severe protein-calorie malnutrition [name]: less than 60% of standard weight) (HCC)   Malnutrition of moderate degree   Nutrition Problem: Moderate Malnutrition Etiology: social / environmental circumstances (autism, bedbound)  Signs/Symptoms: mild fat depletion,severe fat depletion,moderate muscle depletion,severe muscle depletion   Body mass index is 17.79 kg/m.  (Underweight)   Probable aspiration pneumonia, leukocytosis: Leukocytosis has resolved.  Continue IV Zosyn for now.   Colonic ileus with Ogilvie syndrome, constipation: Continue laxatives including enema as needed  Poor oral intake, moderate protein caloric malnutrition, reflux/vomiting, history of erosive esophagitis: s/p EGD on 09/08/2020 which showed erythema in the gastric antrum.  Continue IV Protonix.  Discontinue IV fluids and encourage oral intake. Enteral nutrition via nasogastric tube was discussed with his parents but they are concerned that he will pull out the NG tube.  Parents declined NG tube at this time.  Patient has been evaluated by the dietitian.  Lethargy/altered mental status: CT head done today without was no acute abnormality noted.  Consulted neurologist to assist with  management.  EEG has been ordered as well.  Autism intellectual disability: Continue supportive care  Hypokalemia: Worsened.  Continue IV potassium repletion and monitor levels.  Hyponatremia and hypernatremia: Improved  Plan of care was discussed with his parents at the bedside.    Diet Order            DIET SOFT Room service appropriate? Yes; Fluid consistency: Thin  Diet effective now                    Consultants:  Gastroenterologist  Procedures:  Plan for EGD tomorrow    Medications:   . enoxaparin (LOVENOX) injection  40 mg Subcutaneous Q24H  . feeding supplement  237 mL Oral TID BM  . felbamate  900 mg Oral Q12H  . haloperidol  2 mg Oral Daily  . linaclotide  290 mcg Oral Daily  .  morphine injection  1 mg Intravenous Once  . pantoprazole (PROTONIX) IV  40 mg Intravenous Q12H  . prenatal vitamin w/FE, FA  1 tablet Oral Daily  . sennosides  15 mL Oral BID   Continuous Infusions: . sodium chloride    . dextrose 5 % and 0.9 % NaCl with KCl 40 mEq/L 75 mL/hr at 08/28/20 1022  . piperacillin-tazobactam 3.375 g (08/28/20 1043)     Anti-infectives (From admission, onward)   Start     Dose/Rate Route Frequency Ordered Stop   08/26/20 0815  piperacillin-tazobactam (ZOSYN) IVPB 3.375 g        3.375 g 12.5 mL/hr over  240 Minutes Intravenous Every 8 hours 08/26/20 0806               Family Communication/Anticipated D/C date and plan/Code Status   DVT prophylaxis: enoxaparin (LOVENOX) injection 40 mg Start: 08/21/20 2200     Code Status: Full Code  Family Communication: Plan discussed with his parents at the bedside Disposition Plan:    Status is: Inpatient  Remains inpatient appropriate because:Ongoing diagnostic testing needed not appropriate for outpatient work up and IV treatments appropriate due to intensity of illness or inability to take PO   Dispo: The patient is from: Home              Anticipated d/c is to: Home               Anticipated d/c date is: 2 days              Patient currently is not medically stable to d/c.           Subjective:   Interval events noted.  His parents are at the bedside.  His parents are concerned that he has been mostly lethargic during this admission and oral intake has been poor.  Objective:    Vitals:   08/28/20 0920 08/28/20 1013 08/28/20 1212 08/28/20 1523  BP: 121/79 124/79 123/78 (!) 128/91  Pulse: 85 88 91 77  Resp: 19 16 17 16   Temp:  98.1 F (36.7 C) 99.8 F (37.7 C) 99.2 F (37.3 C)  TempSrc:  Axillary Oral Oral  SpO2: 98% 99% 98% 99%  Weight:      Height:       No data found.   Intake/Output Summary (Last 24 hours) at 08/28/2020 1625 Last data filed at 08/28/2020 1510 Gross per 24 hour  Intake 1060 ml  Output -  Net 1060 ml   Filed Weights   08/21/20 1742 08/28/20 0723  Weight: 48.5 kg 48.5 kg    Exam:  GEN: NAD SKIN: Warm and dry EYES: No pallor or icterus ENT: MMM CV: RRR PULM: CTA B ABD: soft, ND, NT, +BS CNS: Sleepy.  Nonverbal EXT: No edema or tenderness     Data Reviewed:   I have personally reviewed following labs and imaging studies:  Labs: Labs show the following:   Basic Metabolic Panel: Recent Labs  Lab 08/24/20 0557 08/25/20 0340 08/25/20 0433 08/26/20 0549 08/27/20 0614 08/28/20 0547  NA 142  --  138 134* 138 142  K 3.3*  --  3.1* 3.7 3.4* 2.9*  CL 109  --  107 101 102 104  CO2 26  --  26 25 26 27   GLUCOSE 104*  --  120* 134* 104* 90  BUN 13  --  7 8 14 19   CREATININE 0.72  --  0.52* 0.83 0.82 0.72  CALCIUM 8.0*  --  7.9* 8.2* 8.1* 8.0*  MG 1.7  --  1.6* 1.6* 2.0 1.8  PHOS  --  2.8  --   --   --  3.6   GFR Estimated Creatinine Clearance: 89.3 mL/min (by C-G formula based on SCr of 0.72 mg/dL). Liver Function Tests: Recent Labs  Lab 08/21/20 1746  AST 30  ALT 48*  ALKPHOS 97  BILITOT 1.1  PROT 8.5*  ALBUMIN 4.1   Recent Labs  Lab 08/21/20 1746  LIPASE 18   No results for input(s):  AMMONIA in the last 168 hours. Coagulation profile No results for input(s): INR, PROTIME in the last 168 hours.  CBC: Recent Labs  Lab 08/23/20 0343 08/24/20 0557 08/26/20 0549 08/27/20 0614 08/28/20 0547  WBC 9.5 4.7 20.7* 17.1* 8.8  NEUTROABS 6.4 2.2 16.7* 12.2* 6.6  HGB 11.8* 10.3* 11.7* 11.0* 10.4*  HCT 35.1* 31.9* 34.1* 31.8* 30.9*  MCV 89.1 91.7 86.8 86.9 87.0  PLT 259 237 268 233 285   Cardiac Enzymes: No results for input(s): CKTOTAL, CKMB, CKMBINDEX, TROPONINI in the last 168 hours. BNP (last 3 results) No results for input(s): PROBNP in the last 8760 hours. CBG: No results for input(s): GLUCAP in the last 168 hours. D-Dimer: No results for input(s): DDIMER in the last 72 hours. Hgb A1c: No results for input(s): HGBA1C in the last 72 hours. Lipid Profile: No results for input(s): CHOL, HDL, LDLCALC, TRIG, CHOLHDL, LDLDIRECT in the last 72 hours. Thyroid function studies: No results for input(s): TSH, T4TOTAL, T3FREE, THYROIDAB in the last 72 hours.  Invalid input(s): FREET3 Anemia work up: Recent Labs    08/28/20 1305  FOLATE 21.2   Sepsis Labs: Recent Labs  Lab 08/21/20 1919 08/22/20 0801 08/24/20 0557 08/26/20 0549 08/27/20 0614 08/28/20 0547  PROCALCITON  --   --   --  0.94 2.74 1.68  WBC  --    < > 4.7 20.7* 17.1* 8.8  LATICACIDVEN 1.8  --   --   --   --   --    < > = values in this interval not displayed.    Microbiology Recent Results (from the past 240 hour(s))  Resp Panel by RT-PCR (Flu A&B, Covid) Nasopharyngeal Swab     Status: None   Collection Time: 08/21/20  9:33 PM   Specimen: Nasopharyngeal Swab; Nasopharyngeal(NP) swabs in vial transport medium  Result Value Ref Range Status   SARS Coronavirus 2 by RT PCR NEGATIVE NEGATIVE Final    Comment: (NOTE) SARS-CoV-2 target nucleic acids are NOT DETECTED.  The SARS-CoV-2 RNA is generally detectable in upper respiratory specimens during the acute phase of infection. The  lowest concentration of SARS-CoV-2 viral copies this assay can detect is 138 copies/mL. A negative result does not preclude SARS-Cov-2 infection and should not be used as the sole basis for treatment or other patient management decisions. A negative result may occur with  improper specimen collection/handling, submission of specimen other than nasopharyngeal swab, presence of viral mutation(s) within the areas targeted by this assay, and inadequate number of viral copies(<138 copies/mL). A negative result must be combined with clinical observations, patient history, and epidemiological information. The expected result is Negative.  Fact Sheet for Patients:  BloggerCourse.com  Fact Sheet for Healthcare Providers:  SeriousBroker.it  This test is no t yet approved or cleared by the [place] FDA and  has been authorized for detection and/or diagnosis of SARS-CoV-2 by FDA under an Emergency Use Authorization (EUA). This EUA will remain  in effect (meaning this test can be used) for the duration of the COVID-19 declaration under Section 564(b)(1) of the Act, 21 U.S.C.section 360bbb-3(b)(1), unless the authorization is terminated  or revoked sooner.       Influenza A by PCR NEGATIVE NEGATIVE Final   Influenza B by PCR NEGATIVE NEGATIVE Final    Comment: (NOTE) The Xpert Xpress SARS-CoV-2/FLU/RSV plus assay is intended as an aid in the diagnosis of influenza from Nasopharyngeal swab specimens and should not be used as a sole basis for treatment. Nasal washings and aspirates are unacceptable for Xpert Xpress SARS-CoV-2/FLU/RSV testing.  Fact Sheet for Patients: BloggerCourse.com  Fact Sheet for Healthcare  Providers: SeriousBroker.it  This test is not yet approved or cleared by the [place] and has been authorized for detection and/or diagnosis of SARS-CoV-2 by FDA under  an Emergency Use Authorization (EUA). This EUA will remain in effect (meaning this test can be used) for the duration of the COVID-19 declaration under Section 564(b)(1) of the Act, 21 U.S.C. section 360bbb-3(b)(1), unless the authorization is terminated or revoked.  Performed at [facility], [address] [place], [place] [address]   Urine Culture     Status: Abnormal   Collection Time: 08/24/20  7:00 PM   Specimen: Urine, Random  Result Value Ref Range Status   Specimen Description   Final    URINE, RANDOM Performed at [facility], [address] [place], [place] [address]    Special Requests   Final    NONE Performed at [facility], [address] [place], [place] [address]    Culture 70,000 COLONIES/mL ESCHERICHIA COLI (A)  Final   Report Status 08/27/2020 FINAL  Final   Organism ID, Bacteria ESCHERICHIA COLI (A)  Final      Susceptibility   Escherichia coli - MIC*    AMPICILLIN >=32 RESISTANT Resistant     CEFAZOLIN <=4 SENSITIVE Sensitive     CEFEPIME <=0.12 SENSITIVE Sensitive     CEFTRIAXONE <=0.25 SENSITIVE Sensitive     CIPROFLOXACIN <=0.25 SENSITIVE Sensitive     GENTAMICIN <=1 SENSITIVE Sensitive     IMIPENEM <=0.25 SENSITIVE Sensitive     NITROFURANTOIN <=16 SENSITIVE Sensitive     TRIMETH/SULFA >=320 RESISTANT Resistant     AMPICILLIN/SULBACTAM 16 INTERMEDIATE Intermediate     PIP/TAZO <=4 SENSITIVE Sensitive     * 70,000 COLONIES/mL ESCHERICHIA COLI    Procedures and diagnostic studies:  EEG  Result Date: 08/28/2020 Charlsie Quest, MD     08/28/2020  3:41 PM Patient Name: Jonathan Kirby MRN: [id number] Epilepsy Attending: Charlsie Quest Referring Physician/Provider: Dr Lurene Shadow Date: 08/28/2020 Duration: 23.07 mins Patient history: 33 year old male with autism, epilepsy with altered mental status.  EEG to evaluate for seizures. Level of alertness: Awake, asleep AEDs during EEG study: Felbamate Technical aspects:  This EEG study was done with scalp electrodes positioned according to the 10-20 International system of electrode placement. Electrical activity was acquired at a sampling rate of 500Hz  and reviewed with a high frequency filter of 70Hz  and a low frequency filter of 1Hz . EEG data were recorded continuously and digitally stored. Description: No clear posterior dominant rhythm was seen. Sleep was characterized by vertex waves, sleep spindles (12 to 14 Hz), maximal frontocentral region.  EEG showed continuous generalized 3 to 5 Hz theta-delta slowing with overriding 15 to 18 Hz generalized beta activity.  Multifocal spikes were seen, in left frontotemporal, right frontotemporal and bilateral frontocentral region.  Hyperventilation and photic stimulation were not performed.   ABNORMALITY -Continuous slow, generalized -Spikes, left frontotemporal, right frontotemporal and bilateral frontocentral region IMPRESSION: This study is consistent with patient's known history of epilepsy, which could be fragments of generalized epilepsy  or multifocal epilepsy.  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 Charlsie Quest   CT HEAD WO CONTRAST  Result Date: 08/28/2020 CLINICAL DATA:  34 year old male male with unexplained lethargy, autism. EXAM: CT HEAD WITHOUT CONTRAST TECHNIQUE: Contiguous axial images were obtained from the base of the skull through the vertex without intravenous contrast. COMPARISON:  None. FINDINGS: Brain: Cerebellar volume is  below that expected for age. Brainstem volume also appears somewhat reduced. But supratentorial volume appears more normal. No midline shift, ventriculomegaly, mass effect, evidence of mass lesion, intracranial hemorrhage or evidence of cortically based acute infarction. Gray-white matter differentiation is within normal limits throughout the brain. Vascular: No suspicious intracranial vascular  hyperdensity. Skull: Negative. Sinuses/Orbits: Hyperplastic paranasal sinuses, mastoid and sphenoid wing air cells are clear. Other: Visualized orbits and scalp soft tissues are within normal limits. IMPRESSION: 1.  No acute intracranial abnormality. 2. Nonspecific volume loss involving the cerebellum and possibly also the brainstem. Electronically Signed   By: Odessa Fleming M.D.   On: 08/28/2020 15:20               LOS: 6 days   Kristen Fromm  [facility]   Pager on [email or web address]. If 7PM-7AM, please contact night-coverage at [email or web address]     08/28/2020, 4:25 PM

## 2020-08-28 NOTE — Progress Notes (Signed)
Initial Nutrition Assessment  DOCUMENTATION CODES:   Non-severe (moderate) malnutrition in context of social or environmental circumstances  INTERVENTION:   Ensure Enlive po TID, each supplement provides 350 kcal and 20 grams of protein  Magic cup TID with meals, each supplement provides 290 kcal and 9 grams of protein  MVI daily   Pt at high refeed risk; recommend monitor potassium, magnesium and phosphorus labs daily as oral intake improves.  If pt's oral intake does not improve in the next 1-2 days, would recommend nasogastric tube placement and tube feeds  If tube feeds initiated, recommend:  Jevity 1.5 '@50ml' /hr- Initiate at 84m/hr and increase by 122mhr q 8 hours until goal rate is reached  Pro-Source 4571maily via tube, provides 40kcal and 11g of protein per serving   Free water flushes 71m38m hours to maintain tube patency   Regimen provides 1840kcal/day, 87g/day protein, 25g/day fiber and 1092ml25m free water   NUTRITION DIAGNOSIS:   Moderate Malnutrition related to social / environmental circumstances (autism, bedbound) as evidenced by mild fat depletion,severe fat depletion,moderate muscle depletion,severe muscle depletion.  GOAL:   Patient will meet greater than or equal to 90% of their needs  MONITOR:   PO intake,Supplement acceptance,Labs,Weight trends,I & O's,Skin  REASON FOR ASSESSMENT:   Other (Comment) (low BMI)    ASSESSMENT:   34 y.80 male with medical history significant for severe autism, nonverbal at baseline, epilepsy and erosive esophagitis who is admitted with colonic ileus with Ogilvie syndrome   Pt s/p EGD today and found to have hiatal hernia and erythematous mucosa in the antrum  Met with pt and family in room today. Pt is non-verbal and unable to provide any history so history obtained through chart and by family member at bedside. Pt sleeping at time of RD visit. Family member at bedside reports pt with good appetite and oral  intake at baseline. Pt enjoys many foods including chicken nuggets, fries and ice cream at home. Pt has had poor appetite and oral intake since admit. Pt has remained on NPO/liquid diet since admit and is now without adequate nutrition for 7 days. Per family report, pt has just been too sleepy to eat. Spoke with MD, plan is to advance pt to a soft diet today to see if this will improve pt's oral intake. Family member reports that pt loves milk and that he has had the Magic Cups in the past and he enjoys them (vanilla). RD will add supplements and MVI to help pt meet his estimated needs. If patient's oral intake does improve in the next 1-2 days, would recommend nasogastric feeding tube placement and tube feeds. Pt is at high refeed risk. Spoke with MD and family member regarding feeding tube; both family and MD feel as though pt would remove tube.   Medications reviewed and include: lovenox, protonix, sonokot, NaCl w/ 5% dextrose and KCl '@75ml' /hr, zosyn, KCl  Labs reviewed: K 2.9(L) Hgb 10.4(L), Hct 30.9(L)  NUTRITION - FOCUSED PHYSICAL EXAM:  Flowsheet Row Most Recent Value  Orbital Region No depletion  Upper Arm Region Severe depletion  Thoracic and Lumbar Region Mild depletion  Buccal Region No depletion  Temple Region Mild depletion  Clavicle Bone Region Moderate depletion  Clavicle and Acromion Bone Region Moderate depletion  Scapular Bone Region Unable to assess  Dorsal Hand Moderate depletion  Patellar Region Severe depletion  Anterior Thigh Region Severe depletion  Posterior Calf Region Severe depletion  Edema (RD Assessment) None  Hair Reviewed  Eyes Reviewed  Mouth Reviewed  Skin Reviewed  Nails Reviewed     Diet Order:   Diet Order            DIET SOFT Room service appropriate? Yes; Fluid consistency: Thin  Diet effective now                EDUCATION NEEDS:   Education needs have been addressed (with pt's family)  Skin:  Skin Assessment: Reviewed RN  Assessment  Last BM:  12/9- type 7- small amount per family report  Height:   Ht Readings from Last 1 Encounters:  08/28/20 '5\' 5"'  (1.651 m)    Weight:   Wt Readings from Last 1 Encounters:  08/28/20 48.5 kg   BMI:  Body mass index is 17.79 kg/m.  Estimated Nutritional Needs:   Kcal:  1600-1800kcal/day  Protein:  80-90g/day  Fluid:  1.5-1.7L/day  Koleen Distance MS, RD, LDN Please refer to [facility] for RD and/or RD on-call/weekend/after hours pager

## 2020-08-28 NOTE — Progress Notes (Signed)
eeg done °

## 2020-08-28 NOTE — Consult Note (Signed)
NEUROLOGY CONSULTATION NOTE   Date of service: August 28, 2020 Patient Name: Jonathan Kirby MRN:  [id number] DOB:  1985-12-07 Reason for consult: "persistent encephalopathy" _ _ _   _ __   _ __ _ _  __ __   _ __   __ _  History of Present Illness  Jonathan Kirby is a 34 y.o. male with PMH significant for  has a past medical history of severe Autism and non verbal at baseline but communicates with his parents, GERD, and Seizures on Felbamate 900mg  Q12 hours who is admitted with poor po intake with GI distention found to have Ogilvie's.  He is persistently encephalopathic and thus neurology was consulted. He is also being treated with Zosyn for Aspiration Pneumonia.  Mom and Dad at bedside ad provide history. They say that Jonathan Kirby has had poor PO intake for the last few days. He is able to take solids, problems comes when he tries to take liquids. Jonathan Kirby has also had longstanding constipation that has been resistant to medications. They state that Jonathan Kirby has esophagitis and when they tried to back down on protonix, swallowing got worse.  They endorse that they have noted that over the last couple of days, Jonathan Kirby has had been more sleepy. He wakes up and does his usual things but has been more and more sleepy. He has good and bad times. They do report that Jonathan Kirby does withdraw when he is around strangers or in unfamiliar settings.  They report that due to issues with swallowing liquids, they were unable to give Jonathan Kirby his felbamate for about 3 days and then Jonathan Kirby had a seizure lasting about 2 mins. The seizure started with Jonathan Kirby making a loud noise, then his hand hand was stiff and he had a blank stare and that was it. Jonathan Kirby was sleepy afterwards. Jonathan Kirby has not had a seizure in a long time and that this seizure only happened when he was unable to take his Felbamate. [name] still follows with a pediatric neurologist. Vega has been on multiple seizure medications in the past and nothing has helped him. They  report that Jonathan Kirby has been back on his felbamate over the last 2-3 days and they soak a bread in it and have been helping nursing staff give Jonathan Kirby his medications.    ROS   Jonathan Kirby is non verbal at baseline.  Past History   Past Medical History:  Diagnosis Date  . Autism   . GERD (gastroesophageal reflux disease)   . Seizures (HCC)    Past Surgical History:  Procedure Laterality Date  . CIRCUMCISION  1987  . ESOPHAGOGASTRODUODENOSCOPY N/A 04/17/2020   Procedure: ESOPHAGOGASTRODUODENOSCOPY (EGD);  Surgeon: 04/19/2020, MD;  Location: [facility] ENDOSCOPY;  Service: Endoscopy;  Laterality: N/A;   Family History  Problem Relation Age of Onset  . Cancer Maternal Grandmother        Died at 81  . Alzheimer's disease Maternal Grandfather        Died at 69  . Stroke Paternal Grandfather        Died at 1   Social History   Socioeconomic History  . Marital status: Single    Spouse name: Not on file  . Number of children: Not on file  . Years of education: Not on file  . Highest education level: Not on file  Occupational History  . Not on file  Tobacco Use  . Smoking status: Never Smoker  . Smokeless tobacco: Never Used  Substance and  Sexual Activity  . Alcohol use: No    Alcohol/week: 0.0 standard drinks  . Drug use: No  . Sexual activity: Never  Other Topics Concern  . Not on file  Social History Narrative   Jonathan Kirby does not attend any school or day program at this time.    He lives with his parents.    He enjoys listening to music, playing with remote control cars, and collecting newspapers.   Social Determinants of Health   Financial Resource Strain: Not on file  Food Insecurity: Not on file  Transportation Needs: Not on file  Physical Activity: Not on file  Stress: Not on file  Social Connections: Not on file   No Known Allergies  Medications   Medications Prior to Admission  Medication Sig Dispense Refill Last Dose  . felbamate (FELBATOL) 600 MG/5ML  suspension Take 7.5 mL by mouth twice daily 474 mL 5 08/21/2020 at 2230  . haloperidol (HALDOL) 2 MG tablet Crush 1 tablet in place in a puree (Patient taking differently: as needed. Crush 1 tablet in place in a puree) 10 tablet 5 Past Month at PRN  . pantoprazole (PROTONIX) 40 MG tablet Take 1 tablet (40 mg total) by mouth 2 (two) times daily. 60 tablet 1 08/21/2020 at Unknown time  . Plecanatide (TRULANCE) 3 MG TABS Take 3 mg by mouth daily.   08/21/2020 at 0800  . Sennosides 25 MG TABS Take 1 tablet (25 mg total) by mouth in the morning and at bedtime. 120 tablet 3 08/20/2020 at 2230  . sodium phosphate (FLEET) 7-19 GM/118ML ENEM Place 1 enema rectally every three (3) days as needed. If no bowel movement   prn at prn  . linaclotide (LINZESS) 290 MCG CAPS capsule Take 1 capsule (290 mcg total) by mouth daily. (Patient not taking: Reported on 08/21/2020) 30 capsule 3 Not Taking at Unknown time     Vitals   Vitals:   08/28/20 0910 08/28/20 0920 08/28/20 1013 08/28/20 1212  BP: 114/80 121/79 124/79 123/78  Pulse: 83 85 88 91  Resp: (!) 21 19 16 17   Temp:   98.1 F (36.7 C) 99.8 F (37.7 C)  TempSrc:   Axillary Oral  SpO2: 99% 98% 99% 98%  Weight:      Height:         Body mass index is 17.79 kg/m.  Physical Exam   General: Laying comfortably in bed; in no acute distress. HENT: Normal oropharynx and mucosa. Normal external appearance of ears and nose. Neck: Supple, no pain or tenderness CV: No JVD. No peripheral edema. Pulmonary: Symmetric Chest rise. Normal respiratory effort. Abdomen: Soft to touch, non-tender. Ext: No cyanosis, edema, or deformity Skin: No rash. Normal palpation of skin.  Musculoskeletal: Normal digits and nails by inspection. No clubbing.  Neurologic Examination  Mental status/Cognition: Did not open eyes for me. He interacts with his mom and does seems to vocalize to her touch and moves all extremities. Speech/language: Non verbal at baseline. Unable to do a  detailed Cranial nerve exam. No facial asymmetry.  Motor:  Muscle bulk: poor, tone increased in all extremities, no tremor.  Unable to do detailed strength testing as Sevyn would not participate.  Reflexes:  Right Left Comments  Pectoralis      Biceps (C5/6) 1 1   Brachioradialis (C5/6) 1 1    Triceps (C6/7) 1 1    Patellar (L3/4) 1 1    Achilles (S1)      Hoffman  Plantar     Jaw jerk    Sensation:  Light touch Withdraws arms and legs to mild nailbed stimulation.   Pin prick    Temperature    Vibration   Proprioception    Coordination/Complex Motor:  Unable to assess but no obvious ataxia.  Labs   CBC:  Recent Labs  Lab 08/27/20 0614 08/28/20 0547  WBC 17.1* 8.8  NEUTROABS 12.2* 6.6  HGB 11.0* 10.4*  HCT 31.8* 30.9*  MCV 86.9 87.0  PLT 233 285    Basic Metabolic Panel:  Lab Results  Component Value Date   NA 142 08/28/2020   K 2.9 (L) 08/28/2020   CO2 27 08/28/2020   GLUCOSE 90 08/28/2020   BUN 19 08/28/2020   CREATININE 0.72 08/28/2020   CALCIUM 8.0 (L) 08/28/2020   GFRNONAA >60 08/28/2020   GFRAA >60 04/19/2020   Lipid Panel: No results found for: LDLCALC HgbA1c: No results found for: HGBA1C Urine Drug Screen: No results found for: LABOPIA, COCAINSCRNUR, LABBENZ, AMPHETMU, THCU, LABBARB  Alcohol Level No results found for: [facility]  CT Head without contrast: 1.  No acute intracranial abnormality. 2. Nonspecific volume loss involving the cerebellum and possibly also the brainstem.  rEEG:  -Spikes, left frontotemporal, right frontotemporal and bilateral frontocentral region. This study is consistent with patient's known history of epilepsy, which could be fragments of generalized epilepsy  or multifocal epilepsy.  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  Impression   KENYATA GUESS is a 34 y.o. male with PMH significant for with PMH significant for  has a past  medical history of severe Autism and non verbal at baseline but communicates with his parents, GERD, and Seizures on Felbamate 900mg  Q12 hours who is admitted with poor po intake with GI distention found to have Ogilvie's and Aspiration Pneumonia. We were consulted for encephalopathy given his history of seizures. He did havea clinical seizure witnessed by mom a couple days ago in the setting of not being able to take his felbamate. rEEG does show epileptiform discharges but no seizures. CTH with no acute abnormalities.  My concern is that his symptoms are most likely due to toxic metabolic encephalopathy in the setting of poor PO intake and aspiration Pneumonia. Althou, post ictal confusion can also cause seizures.  We should maintain seizure precautions, clinically monitor him for seizure, continue delirium precautions. If Rube does not improve with these, will consider transferring him for cEEG to monitor for subclinical seizures.  I am less concerned about vitamin deficiencies even with his GI issues as Pio takes a multivitamin daily at home. His B12 levels were normal in august, folate is normal, TSH is pending and B1 levels are pending.  Recommendations  - Seizure precautions - Continue Felbamate 900mg  BID - Delirium precautions - discussed low dose melatonin but family declined - Ativan 1mg  IV PRN for seizure activity lasting more than 3 mins - If does no timprove over the next 2-3 days, would recommend transfer for cEEG. - TSH and Vitamin B1 and Ammonia levels ordered for AM - I changed his multivitamin to a chewable multivitamin  ______________________________________________________________________   Thank you for the opportunity to take part in the care of this patient. If you have any further questions, please contact the neurology consultation attending.  Signed,  September [facility] Pager Number _ _ _   _ __   _ __ _ _  __ __  _ __   __  _  

## 2020-08-28 NOTE — Op Note (Signed)
[facility] Gastroenterology Patient Name: Jonathan Kirby Procedure Date: 08/28/2020 7:12 AM MRN: [id number] Account #: [id number] Date of Birth: Oct 28, 1985 Admit Type: Inpatient Age: [age] [facility] END[facility] ROOM 4 Gender: Male Note Status: Finalized Procedure:             Upper GI endoscopy Indications:           Follow-up of reflux esophagitis Providers:             Toney Reilohini Reddy Cache Bills MD, MD Referring MD:          No Local Md, MD (Referring MD) Medicines:             General Anesthesia Complications:         No immediate complications. Estimated blood loss: None. Procedure:             Pre-Anesthesia Assessment:                        - Prior to the procedure, a History and Physical was                         performed, and patient medications and allergies were                         reviewed. The patient is unable to give consent                         secondary to the patient being legally incompetent to                         consent. The risks and benefits of the procedure and                         the sedation options and risks were discussed with the                         patient's mother. All questions were answered and                         informed consent was obtained. Patient identification                         and proposed procedure were verified by the physician,                         the nurse, the anesthesiologist, the anesthetist and                         the technician in the pre-procedure area in the                         procedure room in the endoscopy suite. Mental Status                         Examination: lethargic. Airway Examination: normal                         oropharyngeal airway and neck mobility. Respiratory  Examination: clear to auscultation. CV Examination:                         normal. Prophylactic Antibiotics: The patient does not                         require prophylactic antibiotics.  Prior                         Anticoagulants: The patient has taken no previous                         anticoagulant or antiplatelet agents. ASA Grade                         Assessment: III - A patient with severe systemic                         disease. After reviewing the risks and benefits, the                         patient was deemed in satisfactory condition to                         undergo the procedure. The anesthesia plan was to use                         general anesthesia. Immediately prior to                         administration of medications, the patient was                         re-assessed for adequacy to receive sedatives. The                         heart rate, respiratory rate, oxygen saturations,                         blood pressure, adequacy of pulmonary ventilation, and                         response to care were monitored throughout the                         procedure. The physical status of the patient was                         re-assessed after the procedure.                        After obtaining informed consent, the endoscope was                         passed under direct vision. Throughout the procedure,                         the patient's blood pressure, pulse, and oxygen  saturations were monitored continuously. The Endoscope                         was introduced through the mouth, and advanced to the                         second part of duodenum. The upper GI endoscopy was                         accomplished without difficulty. The patient tolerated                         the procedure well. Findings:      The duodenal bulb and second portion of the duodenum were normal.      A 3 cm hiatal hernia was present.      Diffuse mildly erythematous mucosa without bleeding was found in the       gastric antrum. Biopsies were taken with a cold forceps for histology.      The cardia and gastric fundus were normal on  retroflexion.      The gastroesophageal junction and examined esophagus were normal. Impression:            - Normal duodenal bulb and second portion of the                         duodenum.                        - 3 cm hiatal hernia.                        - Erythematous mucosa in the antrum. Biopsied.                        - Normal gastroesophageal junction and esophagus. Recommendation:        - Await pathology results.                        - Return patient to hospital ward for ongoing care.                        - Advance diet as tolerated today.                        - Continue present medications. Procedure Code(s):     --- Professional ---                        [phone], Esophagogastroduodenoscopy, flexible,                         transoral; with biopsy, single or multiple Diagnosis Code(s):     --- Professional ---                        K44.9, Diaphragmatic hernia without obstruction or                         gangrene  K31.89, Other diseases of stomach and duodenum                        K21.00, Gastro-esophageal reflux disease with                         esophagitis, without bleeding CPT copyright 2019 American Medical Association. All rights reserved. The codes documented in this report are preliminary and upon coder review may  be revised to meet current compliance requirements. Dr. Libby Maw Toney Reil MD, MD 08/28/2020 8:42:57 AM This report has been signed electronically. Number of Addenda: 0 Note Initiated On: 08/28/2020 7:12 AM Estimated Blood Loss:  Estimated blood loss: none.      [facility]

## 2020-08-29 ENCOUNTER — Encounter: Payer: Self-pay | Admitting: Gastroenterology

## 2020-08-29 ENCOUNTER — Inpatient Hospital Stay: Payer: Medicaid Other

## 2020-08-29 DIAGNOSIS — R638 Other symptoms and signs concerning food and fluid intake: Secondary | ICD-10-CM

## 2020-08-29 DIAGNOSIS — R140 Abdominal distension (gaseous): Secondary | ICD-10-CM

## 2020-08-29 DIAGNOSIS — E43 Unspecified severe protein-calorie malnutrition: Secondary | ICD-10-CM

## 2020-08-29 LAB — BASIC METABOLIC PANEL
Anion gap: 13 (ref 5–15)
BUN: 17 mg/dL (ref 6–20)
CO2: 26 mmol/L (ref 22–32)
Calcium: 8 mg/dL — ABNORMAL LOW (ref 8.9–10.3)
Chloride: 108 mmol/L (ref 98–111)
Creatinine, Ser: 0.77 mg/dL (ref 0.61–1.24)
GFR, Estimated: >60 mL/min (ref >60)
Glucose, Bld: 92 mg/dL (ref 70–99)
Potassium: 2.8 mmol/L — ABNORMAL LOW (ref 3.5–5.1)
Sodium: 147 mmol/L — ABNORMAL HIGH (ref 135–145)

## 2020-08-29 LAB — CBC WITH DIFFERENTIAL/PLATELET
Abs Immature Granulocytes: 0.09 10*3/uL — ABNORMAL HIGH (ref 0.00–0.07)
Basophils Absolute: 0 10*3/uL (ref 0.0–0.1)
Basophils Relative: 1 %
Eosinophils Absolute: 0 10*3/uL (ref 0.0–0.5)
Eosinophils Relative: 1 %
HCT: 30.4 % — ABNORMAL LOW (ref 39.0–52.0)
Hemoglobin: 10.4 g/dL — ABNORMAL LOW (ref 13.0–17.0)
Immature Granulocytes: 2 %
Lymphocytes Relative: 23 %
Lymphs Abs: 1.1 10*3/uL (ref 0.7–4.0)
MCH: 29.6 pg (ref 26.0–34.0)
MCHC: 34.2 g/dL (ref 30.0–36.0)
MCV: 86.6 fL (ref 80.0–100.0)
Monocytes Absolute: 0.7 10*3/uL (ref 0.1–1.0)
Monocytes Relative: 15 %
Neutro Abs: 2.9 10*3/uL (ref 1.7–7.7)
Neutrophils Relative %: 58 %
Platelets: 321 10*3/uL (ref 150–400)
RBC: 3.51 MIL/uL — ABNORMAL LOW (ref 4.22–5.81)
RDW: 15 % (ref 11.5–15.5)
Smear Review: NORMAL
WBC: 5 10*3/uL (ref 4.0–10.5)
nRBC: 0 % (ref 0.0–0.2)

## 2020-08-29 LAB — TSH: TSH: 1.318 u[IU]/mL (ref 0.350–4.500)

## 2020-08-29 LAB — AMMONIA: Ammonia: 15 umol/L (ref 9–35)

## 2020-08-29 LAB — SURGICAL PATHOLOGY

## 2020-08-29 LAB — POTASSIUM: Potassium: 3.6 mmol/L (ref 3.5–5.1)

## 2020-08-29 LAB — MAGNESIUM: Magnesium: 1.8 mg/dL (ref 1.7–2.4)

## 2020-08-29 MED ORDER — LORAZEPAM 2 MG/ML IJ SOLN: 1.0000 mg | Freq: Once | INTRAMUSCULAR | Status: DC

## 2020-08-29 MED ORDER — SODIUM CHLORIDE 0.45 % IV SOLN
INTRAVENOUS | Status: DC
  Filled 2020-08-29 (×4): qty 1000

## 2020-08-29 MED ORDER — MAGNESIUM SULFATE 2 GM/50ML IV SOLN
2.0000 g | Freq: Once | INTRAVENOUS | Status: AC
  Administered 2020-08-29: 2 g via INTRAVENOUS
  Filled 2020-08-29: qty 50

## 2020-08-29 MED ORDER — POTASSIUM CHLORIDE 10 MEQ/100ML IV SOLN
10.0000 meq | INTRAVENOUS | Status: AC
  Administered 2020-08-29 (×4): 10 meq via INTRAVENOUS
  Filled 2020-08-29 (×2): qty 100

## 2020-08-29 NOTE — Anesthesia Postprocedure Evaluation (Signed)
Anesthesia Post Note  Patient: Jonathan Kirby  Procedure(s) Performed: ESOPHAGOGASTRODUODENOSCOPY (EGD) WITH PROPOFOL (N/A )  Patient location during evaluation: PACU Anesthesia Type: General Level of consciousness: awake and alert Pain management: pain level controlled Vital Signs Assessment: post-procedure vital signs reviewed and stable Respiratory status: spontaneous breathing, nonlabored ventilation and respiratory function stable Cardiovascular status: blood pressure returned to baseline and stable Postop Assessment: no apparent nausea or vomiting Anesthetic complications: no   No complications documented.   Last Vitals:  Vitals:   08/29/20 0910 08/29/20 1648  BP: 124/83 (!) 135/94  Pulse: 61 63  Resp: 16 16  Temp: 36.8 C 37.2 C  SpO2: 99% 98%    Last Pain:  Vitals:   08/29/20 1648  TempSrc: Axillary  PainSc:                  Karleen Hampshire

## 2020-08-29 NOTE — Progress Notes (Signed)
PT Cancellation Note  Patient Details Name: Jonathan Kirby MRN: [id number] DOB: August 11, 1986   Cancelled Treatment:    Reason Eval/Treat Not Completed: Medical issues which prohibited therapy: Pt's recent Ka 2.8 falling outside guidelines for participation with PT services.  Will attempt to see pt at a future date/time as medically appropriate.    Ovidio Hanger PT, DPT 08/29/20, 10:55 AM

## 2020-08-29 NOTE — Progress Notes (Signed)
Arlyss Repressohini R Lindsee Labarre, MD [address]  [address]  [place], [place] [id number]  Main: [phone]  Fax: [phone] Pager: [phone]   Subjective: Patient underwent upper endoscopy which revealed very small hiatal hernia only, otherwise unremarkable.  Patient developed worsening of abdominal distention since removal of rectal tube.  He received a suppository as well as enema overnight, patient's mom reported a small smear of liquid bowel movement this morning.  His abdominal distention has worsened, confirmed as ileus by the x-ray.  Patient also had bedside speech and swallow evaluation.  Patient's mom is very concerned regarding his overall clinical progress and recurrence of ileus.  He appears withdrawn, very limited p.o. intake.  He had only half a cup of Magic cup   Objective: Vital signs in last 24 hours: Vitals:   08/28/20 2032 08/28/20 2340 08/29/20 0910 08/29/20 1648  BP: (!) 129/91 127/82 124/83 (!) 135/94  Pulse: 78 73 61 63  Resp: 14 16 16 16   Temp: 98.2 F (36.8 C) 98.3 F (36.8 C) 98.2 F (36.8 C) 99 F (37.2 C)  TempSrc: Oral  Axillary Axillary  SpO2: 99% 99% 99% 98%  Weight:      Height:       Weight change:   Intake/Output Summary (Last 24 hours) at 08/29/2020 1702 Last data filed at 08/29/2020 1417 Gross per 24 hour  Intake 0 ml  Output --  Net 0 ml     Exam: Heart:: Regular rate and rhythm or S1S2 present Lungs: normal Abdomen: Soft, severely distended, not tense, tympanic to percussion, hypoactive bowel sounds   Lab Results: CBC Latest Ref Rng & Units 08/29/2020 08/28/2020 08/27/2020  WBC 4.0 - 10.5 K/uL 5.0 8.8 17.1(H)  Hemoglobin 13.0 - 17.0 g/dL 10.4(L) 10.4(L) 11.0(L)  Hematocrit 39.0 - 52.0 % 30.4(L) 30.9(L) 31.8(L)  Platelets 150 - 400 K/uL 321 285 233   CMP Latest Ref Rng & Units 08/29/2020 08/29/2020 08/28/2020  Glucose 70 - 99 mg/dL - 92 90  BUN 6 - 20 mg/dL - 17 19  Creatinine 5.180.61 - 1.24 mg/dL - 8.410.77 6.600.72  Sodium 630135 - 145  mmol/L - 147(H) 142  Potassium 3.5 - 5.1 mmol/L 3.6 2.8(L) 2.9(L)  Chloride 98 - 111 mmol/L - 108 104  CO2 22 - 32 mmol/L - 26 27  Calcium 8.9 - 10.3 mg/dL - 8.0(L) 8.0(L)  Total Protein 6.5 - 8.1 g/dL - - -  Total Bilirubin 0.3 - 1.2 mg/dL - - -  Alkaline Phos 38 - 126 U/L - - -  AST 15 - 41 U/L - - -  ALT 0 - 44 U/L - - -    Micro Results: Recent Results (from the past 240 hour(s))  Resp Panel by RT-PCR (Flu A&B, Covid) Nasopharyngeal Swab     Status: None   Collection Time: 08/21/20  9:33 PM   Specimen: Nasopharyngeal Swab; Nasopharyngeal(NP) swabs in vial transport medium  Result Value Ref Range Status   SARS Coronavirus 2 by RT PCR NEGATIVE NEGATIVE Final    Comment: (NOTE) SARS-CoV-2 target nucleic acids are NOT DETECTED.  The SARS-CoV-2 RNA is generally detectable in upper respiratory specimens during the acute phase of infection. The lowest concentration of SARS-CoV-2 viral copies this assay can detect is 138 copies/mL. A negative result does not preclude SARS-Cov-2 infection and should not be used as the sole basis for treatment or other patient management decisions. A negative result may occur with  improper specimen collection/handling, submission of specimen other than  nasopharyngeal swab, presence of viral mutation(s) within the areas targeted by this assay, and inadequate number of viral copies(<138 copies/mL). A negative result must be combined with clinical observations, patient history, and epidemiological information. The expected result is Negative.  Fact Sheet for Patients:  BloggerCourse.com  Fact Sheet for Healthcare Providers:  SeriousBroker.it  This test is no t yet approved or cleared by the [place] FDA and  has been authorized for detection and/or diagnosis of SARS-CoV-2 by FDA under an Emergency Use Authorization (EUA). This EUA will remain  in effect (meaning this test can be used) for the  duration of the COVID-19 declaration under Section 564(b)(1) of the Act, 21 U.S.C.section 360bbb-3(b)(1), unless the authorization is terminated  or revoked sooner.       Influenza A by PCR NEGATIVE NEGATIVE Final   Influenza B by PCR NEGATIVE NEGATIVE Final    Comment: (NOTE) The Xpert Xpress SARS-CoV-2/FLU/RSV plus assay is intended as an aid in the diagnosis of influenza from Nasopharyngeal swab specimens and should not be used as a sole basis for treatment. Nasal washings and aspirates are unacceptable for Xpert Xpress SARS-CoV-2/FLU/RSV testing.  Fact Sheet for Patients: BloggerCourse.com  Fact Sheet for Healthcare Providers: SeriousBroker.it  This test is not yet approved or cleared by the [place] FDA and has been authorized for detection and/or diagnosis of SARS-CoV-2 by FDA under an Emergency Use Authorization (EUA). This EUA will remain in effect (meaning this test can be used) for the duration of the COVID-19 declaration under Section 564(b)(1) of the Act, 21 U.S.C. section 360bbb-3(b)(1), unless the authorization is terminated or revoked.  Performed at [facility], [address] [place], [place] [address]   Urine Culture     Status: Abnormal   Collection Time: 08/24/20  7:00 PM   Specimen: Urine, Random  Result Value Ref Range Status   Specimen Description   Final    URINE, RANDOM Performed at [facility], [address] [place], [place] [address]    Special Requests   Final    NONE Performed at [facility], [address] [place], [place] [address]    Culture 70,000 COLONIES/mL ESCHERICHIA COLI (A)  Final   Report Status 08/27/2020 FINAL  Final   Organism ID, Bacteria ESCHERICHIA COLI (A)  Final      Susceptibility   Escherichia coli - MIC*    AMPICILLIN >=32 RESISTANT Resistant     CEFAZOLIN <=4 SENSITIVE Sensitive     CEFEPIME <=0.12 SENSITIVE Sensitive      CEFTRIAXONE <=0.25 SENSITIVE Sensitive     CIPROFLOXACIN <=0.25 SENSITIVE Sensitive     GENTAMICIN <=1 SENSITIVE Sensitive     IMIPENEM <=0.25 SENSITIVE Sensitive     NITROFURANTOIN <=16 SENSITIVE Sensitive     TRIMETH/SULFA >=320 RESISTANT Resistant     AMPICILLIN/SULBACTAM 16 INTERMEDIATE Intermediate     PIP/TAZO <=4 SENSITIVE Sensitive     * 70,000 COLONIES/mL ESCHERICHIA COLI   Studies/Results: EEG  Result Date: 08/28/2020 Charlsie Quest, MD     08/28/2020  3:41 PM Patient Name: AMILIO ZEHNDER MRN: [id number] Epilepsy Attending: Charlsie Quest Referring Physician/Provider: Dr Lurene Shadow Date: 08/28/2020 Duration: 23.07 mins Patient history: 34 year old male with autism, epilepsy with altered mental status.  EEG to evaluate for seizures. Level of alertness: Awake, asleep AEDs during EEG study: Felbamate Technical aspects: This EEG study was done with scalp electrodes positioned according to the 10-20 International system of electrode placement. Electrical activity was acquired at a  sampling rate of 500Hz  and reviewed with a high frequency filter of 70Hz  and a low frequency filter of 1Hz . EEG data were recorded continuously and digitally stored. Description: No clear posterior dominant rhythm was seen. Sleep was characterized by vertex waves, sleep spindles (12 to 14 Hz), maximal frontocentral region.  EEG showed continuous generalized 3 to 5 Hz theta-delta slowing with overriding 15 to 18 Hz generalized beta activity.  Multifocal spikes were seen, in left frontotemporal, right frontotemporal and bilateral frontocentral region.  Hyperventilation and photic stimulation were not performed.   ABNORMALITY -Continuous slow, generalized -Spikes, left frontotemporal, right frontotemporal and bilateral frontocentral region IMPRESSION: This study is consistent with patient's known history of epilepsy, which could be fragments of generalized epilepsy  or multifocal epilepsy.  Additionally there is evidence  of moderate diffuse encephalopathy, which could be part of patient's epileptic encephalopathy.   DG Abd 1 View  Result Date: 08/29/2020 CLINICAL DATA:  Poor p.o. intake.  History of constipation EXAM: ABDOMEN - 1 VIEW COMPARISON:  08/26/2020 FINDINGS: Gaseous distention of large in small bowel diffusely. Small amount of stool in the colon. No free air. Markedly dilated sigmoid colon as noted on prior CT 08/21/2020 Normal skeletal structures IMPRESSION: Moderate distention of large and small bowel loops diffusely extending to the sigmoid colon. Findings compatible with ileus Electronically Signed   By: 14/06/2020 M.D.   On: 08/29/2020 11:36   CT HEAD WO CONTRAST  Result Date: 08/28/2020 CLINICAL DATA:  34 year old male male with unexplained lethargy, autism. EXAM: CT HEAD WITHOUT CONTRAST TECHNIQUE: Contiguous axial images were obtained from the base of the skull through the vertex without intravenous contrast. COMPARISON:  None. FINDINGS: Brain: Cerebellar volume is below that expected for age. Brainstem volume also appears somewhat reduced. But supratentorial volume appears more normal. No midline shift, ventriculomegaly, mass effect, evidence of mass lesion, intracranial hemorrhage or evidence of cortically based acute infarction. Gray-white matter differentiation is within normal limits throughout the brain. Vascular: No suspicious intracranial vascular hyperdensity. Skull: Negative. Sinuses/Orbits: Hyperplastic paranasal sinuses, mastoid and sphenoid wing air cells are clear. Other: Visualized orbits and scalp soft tissues are within normal limits. IMPRESSION: 1.  No acute intracranial abnormality. 2. Nonspecific volume loss involving the cerebellum and possibly also the brainstem. Electronically Signed   By: 14/06/2020 M.D.   On: 08/28/2020 15:20   Medications:  I have reviewed the patient's current medications. Prior to Admission:  Medications Prior to Admission  Medication Sig  Dispense Refill Last Dose  . felbamate (FELBATOL) 600 MG/5ML suspension Take 7.5 mL by mouth twice daily 474 mL 5 08/21/2020 at 2230  . haloperidol (HALDOL) 2 MG tablet Crush 1 tablet in place in a puree (Patient taking differently: as needed. Crush 1 tablet in place in a puree) 10 tablet 5 Past Month at PRN  . pantoprazole (PROTONIX) 40 MG tablet Take 1 tablet (40 mg total) by mouth 2 (two) times daily. 60 tablet 1 08/21/2020 at Unknown time  . Plecanatide (TRULANCE) 3 MG TABS Take 3 mg by mouth daily.   08/21/2020 at 0800  . Sennosides 25 MG TABS Take 1 tablet (25 mg total) by mouth in the morning and at bedtime. 120 tablet 3 08/20/2020 at 2230  . sodium phosphate (FLEET) 7-19 GM/118ML ENEM Place 1 enema rectally every three (3) days as needed. If no bowel movement   prn at prn  . linaclotide (LINZESS) 290 MCG CAPS capsule Take 1 capsule (290 mcg total) by mouth  daily. (Patient not taking: Reported on 08/21/2020) 30 capsule 3 Not Taking at Unknown time   Scheduled: . enoxaparin (LOVENOX) injection  40 mg Subcutaneous Q24H  . feeding supplement  237 mL Oral TID BM  . felbamate  900 mg Oral Q12H  . linaclotide  290 mcg Oral Daily  .  morphine injection  1 mg Intravenous Once  . pantoprazole (PROTONIX) IV  40 mg Intravenous Q12H  . prenatal vitamin w/FE, FA  1 tablet Oral Daily  . sennosides  15 mL Oral BID   Continuous: . sodium chloride    . piperacillin-tazobactam 3.375 g (08/29/20 1043)  . sodium chloride 0.45 % with kcl 75 mL/hr at 08/29/20 6294   TML:[id number] **OR** ondansetron (ZOFRAN) IV Anti-infectives (From admission, onward)   Start     Dose/Rate Route Frequency Ordered Stop   08/26/20 0815  piperacillin-tazobactam (ZOSYN) IVPB 3.375 g        3.375 g 12.5 mL/hr over 240 Minutes Intravenous Every 8 hours 08/26/20 0806 08/30/20 2359     Scheduled Meds: . enoxaparin (LOVENOX) injection  40 mg Subcutaneous Q24H  . feeding supplement  237 mL Oral TID BM  . felbamate  900 mg Oral  Q12H  . linaclotide  290 mcg Oral Daily  .  morphine injection  1 mg Intravenous Once  . pantoprazole (PROTONIX) IV  40 mg Intravenous Q12H  . prenatal vitamin w/FE, FA  1 tablet Oral Daily  . sennosides  15 mL Oral BID   Continuous Infusions: . sodium chloride    . piperacillin-tazobactam 3.375 g (08/29/20 1043)  . potassium chloride 10 mEq (08/29/20 1604)  . sodium chloride 0.45 % with kcl 75 mL/hr at 08/29/20 0929   PRN Meds:.ondansetron **OR** ondansetron (ZOFRAN) IV   Assessment: Principal Problem:   Abdominal pain Active Problems:   Autism spectrum disorder with accompanying language impairment and intellectual disability, requiring very substantial support   Constipation   Decreased oral intake   Hypokalemia   Hypomagnesemia   Ogilvie syndrome   Acute metabolic encephalopathy   Aspiration pneumonia of both lower lobes due to gastric secretions (HCC)   Severe protein-calorie malnutrition [name]: less than 60% of standard weight) (HCC)   Malnutrition of moderate degree    Plan: Generalized ileus involving small and large bowel Recurred after removal of rectal tube Today, I have extensively discussed regarding management of ileus including correction of electrolyte abnormalities, bowel decompression using rectal tube or via sigmoidoscopy or trial of neostigmine, including risks and benefits.  Patient's mom is not ready to put him through endoscopy decompression or neostigmine trial yet.  She believes that the rectal tube immensely helped previously and would like [place] to attempt again.  Patient will receive rectal tube placement today Patient has minimal stool burden at this time based on several abdominal x-rays.  And, she believes Fleet enema and suppository are making his distention worse.  Patient had tried Linzess and Trulance as outpatient which also did not help.  In last few months, patient's physical activity has been gradually declining which may have also attributed  to ileus Maintain potassium above 4, phosphorus above 3, magnesium about 2, currently at goal  Poor p.o. intake: No evidence of esophageal dysphagia or oropharyngeal dysphagia.  Per speech therapy, patient is not initiating swallow due to cognitive impairment. I have discussed with patient's mom that tube feeds via [place] is a temporary option to check if he can tolerate the feeds.  I have also discussed with her about  G-tube as a long-term option for nutrition and she is not ready to make that decision yet   Dr. Tobi Bastos will be covering for the weekend   LOS: 7 days   Lizzeth Meder 08/29/2020, 5:02 PM

## 2020-08-29 NOTE — Evaluation (Signed)
Clinical/Bedside Swallow Evaluation Patient Details  Name: Jonathan Kirby MRN: [id number] Date of Birth: 04-25-1986  Today's Date: 08/29/2020 Time: SLP Start Time (ACUTE ONLY): 1330 SLP Stop Time (ACUTE ONLY): 1410 SLP Time Calculation (min) (ACUTE ONLY): 40 min  Past Medical History:  Past Medical History:  Diagnosis Date  . Autism   . GERD (gastroesophageal reflux disease)   . Seizures (HCC)    Past Surgical History:  Past Surgical History:  Procedure Laterality Date  . CIRCUMCISION  1987  . ESOPHAGOGASTRODUODENOSCOPY N/A 04/17/2020   Procedure: ESOPHAGOGASTRODUODENOSCOPY (EGD);  Surgeon: Pasty Spillers, MD;  Location[facility] ENDOSCOPY;  Service: Endoscopy;  Laterality: N/A;  . ESOPHAGOGASTRODUODENOSCOPY (EGD) WITH PROPOFOL N/A 08/28/2020   Procedure: ESOPHAGOGASTRODUODENOSCOPY (EGD) WITH PROPOFOL;  Surgeon: Toney Reil, MD;  Location[facility] ENDOSCOPY;  Service: Gastroenterology;  Laterality: N/A;   HPI:  Jonathan Kirby is a 34 year old male known to this Clinical research associate from previous admission on April 14, 2020. Pt has medical history significant for severe autism, nonverbal at baseline, who was brought in by his parents for evaluation of abdominal distention and poor oral intake for couple of days.  His mother states that his last bowel movement was a small amount on Sunday 11/28 despite giving him laxatives and enemas.  She got concerned because patient's abdomen is very distended and he appears uncomfortable.  He also has very poor oral intake and decreased urine output. CT scan abdomen/pelvis with contrast showed severe dilated colon. Chest x-ray is negative.   Assessment / Plan / Recommendation Clinical Impression  Pt continues to present with chronic inability to consume liquids and sufficient amounts of food. Per his mother's report, his presentation is unchanged since his admission on April 14, 2020. Pt's chest x-ray continues to be negative. She states that he is more willing to consume  food items than liquids. He continually holds liquids in his mouth. She felt that he grimaced less when taking Protonix BID, however his intake didn't increase or his ability to consume liquids. At bedside today, pt orally held trial spoonful of jello in his mouth. Unfortunately he was not responsive to verbal cues and he responded aggressively to tactile cues to help facilitate swallow. His current presentation is c/b of cognitive based impairments that result in him oral holding bolus. Unfortunately, skilled ST intervention is unable to impact pt's cognitive response of orally holding liquids as pt is not able to cognitively participate in skilled therapy. Given the chronic nature of pt's deficits (since July), pt's family may wish to consider a long term means of nutrition. GI, neurologist and pt's nurse made aware of evaluation and recommendations. At this time, ST will sign off.  SLP Visit Diagnosis: Cognitive communication deficit (R41.841);Dysphagia, unspecified (R13.10)    Aspiration Risk  Risk for inadequate nutrition/hydration    Diet Recommendation  (none at this time)   Medication Administration: Via alternative means    Other  Recommendations Oral Care Recommendations: Oral care QID Other Recommendations: Have oral suction available   Follow up Recommendations None             Prognosis Prognosis for Safe Diet Advancement: Guarded Barriers to Reach Goals: Cognitive deficits;Severity of deficits (chronic nature of deficits)      Swallow Study   General Date of Onset: 08/21/20 HPI: Jonathan Kirby is a 34 year old male known to this Clinical research associate from previous admission on April 14, 2020. Pt has medical history significant for severe autism, nonverbal at baseline, who was brought in by  his parents for evaluation of abdominal distention and poor oral intake for couple of days.  His mother states that his last bowel movement was a small amount on Sunday 11/28 despite giving him laxatives and  enemas.  She got concerned because patient's abdomen is very distended and he appears uncomfortable.  He also has very poor oral intake and decreased urine output. CT scan abdomen/pelvis with contrast showed severe dilated colon. Chest x-ray is negative. Type of Study: Bedside Swallow Evaluation Previous Swallow Assessment: previous BSE 03/2020 Diet Prior to this Study: NPO Temperature Spikes Noted: No Respiratory Status: Room air History of Recent Intubation: No Behavior/Cognition: Confused;Agitated;Doesn't follow directions Oral Cavity Assessment:  (unable to assess d/t cognitive impairments) Oral Cavity - Dentition: Adequate natural dentition Self-Feeding Abilities: Total assist Patient Positioning: Upright in bed Baseline Vocal Quality: Not observed Volitional Cough: Cognitively unable to elicit Volitional Swallow: Unable to elicit    Oral/Motor/Sensory Function Overall Oral Motor/Sensory Function: Within functional limits                  Miabella Shannahan B. Dreama Saa M.S., CCC-SLP, [facility] Speech-Language Pathologist Rehabilitation Services Office [phone]     Rohn Fritsch 08/29/2020,4:58 PM

## 2020-08-29 NOTE — Progress Notes (Signed)
PT Cancellation Note  Patient Details Name: Jonathan Kirby MRN: [id number] DOB: 30-Jul-1986   Cancelled Treatment:    Reason Eval/Treat Not Completed: Other (comment): Upon entering room pt found being attended to by multiple nurses and family.  Per nursing and family request PT to be held this date with family stating to please check in tomorrow and they will determine if they are interested in PT services.  Will attempt to see pt at a future date/time as medically appropriate.     Ovidio Hanger PT, DPT 08/29/20, 4:25 PM

## 2020-08-29 NOTE — Progress Notes (Addendum)
Progress Note    Jonathan Kirby  IRS:[id number] DOB: 1986/01/28  DOA: 08/21/2020 PCP: Almetta Lovely, [facility]      Brief Narrative:    Medical records reviewed and are as summarized below:  Jonathan Kirby is a 34 y.o. male       Assessment/Plan:   Principal Problem:   Abdominal pain Active Problems:   Autism spectrum disorder with accompanying language impairment and intellectual disability, requiring very substantial support   Constipation   Decreased oral intake   Hypokalemia   Hypomagnesemia   Ogilvie syndrome   Acute metabolic encephalopathy   Aspiration pneumonia of both lower lobes due to gastric secretions (HCC)   Severe protein-calorie malnutrition [name]: less than 60% of standard weight) (HCC)   Malnutrition of moderate degree   Nutrition Problem: Moderate Malnutrition Etiology: social / environmental circumstances (autism, bedbound)  Signs/Symptoms: mild fat depletion,severe fat depletion,moderate muscle depletion,severe muscle depletion   Body mass index is 17.79 kg/m.  (Underweight)   Probable aspiration pneumonia, leukocytosis: Continue IV Zosyn.  Colonic ileus with Ogilvie syndrome, constipation: Dulcolax as needed for constipation.  Rectal tube in place.  Plan was discussed with Dr. Allegra Lai, gastroenterologist, at the bedside.   Poor oral intake, reflux/vomiting, history of erosive esophagitis: Treat with IV Protonix and IV fluids.  Plan for EGD tomorrow.  Autism intellectual disability: Continue supportive care  Hypokalemia: Replete potassium intravenously  Hyponatremia and hyponatremia: Improved  Plan of care was discussed with his mother and father at the bedside.    Diet Order            Diet NPO time specified Except for: Sips with Meds  Diet effective now                    Consultants:  Gastroenterologist  Procedures:  Plan for EGD tomorrow    Medications:   . enoxaparin (LOVENOX) injection  40 mg  Subcutaneous Q24H  . feeding supplement  237 mL Oral TID BM  . felbamate  900 mg Oral Q12H  . haloperidol  2 mg Oral Daily  . linaclotide  290 mcg Oral Daily  .  morphine injection  1 mg Intravenous Once  . pantoprazole (PROTONIX) IV  40 mg Intravenous Q12H  . prenatal vitamin w/FE, FA  1 tablet Oral Daily  . sennosides  15 mL Oral BID   Continuous Infusions: . sodium chloride    . piperacillin-tazobactam 3.375 g (08/29/20 1043)  . potassium chloride 10 mEq (08/29/20 1604)  . sodium chloride 0.45 % with kcl 75 mL/hr at 08/29/20 0929     Anti-infectives (From admission, onward)   Start     Dose/Rate Route Frequency Ordered Stop   08/26/20 0815  piperacillin-tazobactam (ZOSYN) IVPB 3.375 g        3.375 g 12.5 mL/hr over 240 Minutes Intravenous Every 8 hours 08/26/20 0806 08/30/20 2359             Family Communication/Anticipated D/C date and plan/Code Status   DVT prophylaxis: enoxaparin (LOVENOX) injection 40 mg Start: 08/21/20 2200     Code Status: Full Code  Family Communication: Plan discussed with his parents at the bedside Disposition Plan:    Status is: Inpatient  Remains inpatient appropriate because:Ongoing diagnostic testing needed not appropriate for outpatient work up and IV treatments appropriate due to intensity of illness or inability to take PO   Dispo: The patient is from: Home  Anticipated d/c is to: Home              Anticipated d/c date is: 2 days              Patient currently is not medically stable to d/c.           Subjective:   Interval events noted.  Patient is unable to provide any history.  His parents are at the bedside.  His mother said he has been spitting up a lot and is unable to swallow any liquids or medications  Objective:    Vitals:   08/28/20 1523 08/28/20 2032 08/28/20 2340 08/29/20 0910  BP: (!) 128/91 (!) 129/91 127/82 124/83  Pulse: 77 78 73 61  Resp: 16 14 16 16   Temp: 99.2 F (37.3 C)  98.2 F (36.8 C) 98.3 F (36.8 C) 98.2 F (36.8 C)  TempSrc: Oral Oral  Axillary  SpO2: 99% 99% 99% 99%  Weight:      Height:       No data found.   Intake/Output Summary (Last 24 hours) at 08/29/2020 1648 Last data filed at 08/29/2020 1417 Gross per 24 hour  Intake 0 ml  Output --  Net 0 ml   Filed Weights   08/21/20 1742 08/28/20 0723  Weight: 48.5 kg 48.5 kg    Exam:  GEN: NAD SKIN: Warm and dry EYES: Anicteric ENT: MMM CV: RRR PULM: CTA B ABD: soft, distended, NT, +BS CNS: Sleepy. Autistic and nonverbal EXT: No edema or tenderness   Data Reviewed:   I have personally reviewed following labs and imaging studies:  Labs: Labs show the following:   Basic Metabolic Panel: Recent Labs  Lab 08/25/20 0340 08/25/20 0433 08/26/20 0549 08/27/20 0614 08/28/20 0547 08/29/20 0552 08/29/20 1551  NA  --  138 134* 138 142 147*  --   K  --  3.1* 3.7 3.4* 2.9* 2.8* 3.6  CL  --  107 101 102 104 108  --   CO2  --  26 25 26 27 26   --   GLUCOSE  --  120* 134* 104* 90 92  --   BUN  --  7 8 14 19 17   --   CREATININE  --  0.52* 0.83 0.82 0.72 0.77  --   CALCIUM  --  7.9* 8.2* 8.1* 8.0* 8.0*  --   MG  --  1.6* 1.6* 2.0 1.8 1.8  --   PHOS 2.8  --   --   --  3.6  --   --    GFR Estimated Creatinine Clearance: 89.3 mL/min (by C-G formula based on SCr of 0.77 mg/dL). Liver Function Tests: No results for input(s): AST, ALT, ALKPHOS, BILITOT, PROT, ALBUMIN in the last 168 hours. No results for input(s): LIPASE, AMYLASE in the last 168 hours. Recent Labs  Lab 08/29/20 0552  AMMONIA 15   Coagulation profile No results for input(s): INR, PROTIME in the last 168 hours.  CBC: Recent Labs  Lab 08/24/20 0557 08/26/20 0549 08/27/20 0614 08/28/20 0547 08/29/20 0552  WBC 4.7 20.7* 17.1* 8.8 5.0  NEUTROABS 2.2 16.7* 12.2* 6.6 2.9  HGB 10.3* 11.7* 11.0* 10.4* 10.4*  HCT 31.9* 34.1* 31.8* 30.9* 30.4*  MCV 91.7 86.8 86.9 87.0 86.6  PLT 237 268 233 285 321   Cardiac  Enzymes: No results for input(s): CKTOTAL, CKMB, CKMBINDEX, TROPONINI in the last 168 hours. BNP (last 3 results) No results for input(s): PROBNP in the last 8760 hours.  CBG: No results for input(s): GLUCAP in the last 168 hours. D-Dimer: No results for input(s): DDIMER in the last 72 hours. Hgb A1c: No results for input(s): HGBA1C in the last 72 hours. Lipid Profile: No results for input(s): CHOL, HDL, LDLCALC, TRIG, CHOLHDL, LDLDIRECT in the last 72 hours. Thyroid function studies: Recent Labs    08/29/20 0552  TSH 1.318   Anemia work up: Recent Labs    08/28/20 1305  FOLATE 21.2   Sepsis Labs: Recent Labs  Lab 08/26/20 0549 08/27/20 0614 08/28/20 0547 08/29/20 0552  PROCALCITON 0.94 2.74 1.68  --   WBC 20.7* 17.1* 8.8 5.0    Microbiology Recent Results (from the past 240 hour(s))  Resp Panel by RT-PCR (Flu A&B, Covid) Nasopharyngeal Swab     Status: None   Collection Time: 08/21/20  9:33 PM   Specimen: Nasopharyngeal Swab; Nasopharyngeal(NP) swabs in vial transport medium  Result Value Ref Range Status   SARS Coronavirus 2 by RT PCR NEGATIVE NEGATIVE Final    Comment: (NOTE) SARS-CoV-2 target nucleic acids are NOT DETECTED.  The SARS-CoV-2 RNA is generally detectable in upper respiratory specimens during the acute phase of infection. The lowest concentration of SARS-CoV-2 viral copies this assay can detect is 138 copies/mL. A negative result does not preclude SARS-Cov-2 infection and should not be used as the sole basis for treatment or other patient management decisions. A negative result may occur with  improper specimen collection/handling, submission of specimen other than nasopharyngeal swab, presence of viral mutation(s) within the areas targeted by this assay, and inadequate number of viral copies(<138 copies/mL). A negative result must be combined with clinical observations, patient history, and epidemiological information. The expected result is  Negative.  Fact Sheet for Patients:  BloggerCourse.com  Fact Sheet for Healthcare Providers:  SeriousBroker.it  This test is no t yet approved or cleared by the [place] FDA and  has been authorized for detection and/or diagnosis of SARS-CoV-2 by FDA under an Emergency Use Authorization (EUA). This EUA will remain  in effect (meaning this test can be used) for the duration of the COVID-19 declaration under Section 564(b)(1) of the Act, 21 U.S.C.section 360bbb-3(b)(1), unless the authorization is terminated  or revoked sooner.       Influenza A by PCR NEGATIVE NEGATIVE Final   Influenza B by PCR NEGATIVE NEGATIVE Final    Comment: (NOTE) The Xpert Xpress SARS-CoV-2/FLU/RSV plus assay is intended as an aid in the diagnosis of influenza from Nasopharyngeal swab specimens and should not be used as a sole basis for treatment. Nasal washings and aspirates are unacceptable for Xpert Xpress SARS-CoV-2/FLU/RSV testing.  Fact Sheet for Patients: BloggerCourse.com  Fact Sheet for Healthcare Providers: SeriousBroker.it  This test is not yet approved or cleared by the [place] FDA and has been authorized for detection and/or diagnosis of SARS-CoV-2 by FDA under an Emergency Use Authorization (EUA). This EUA will remain in effect (meaning this test can be used) for the duration of the COVID-19 declaration under Section 564(b)(1) of the Act, 21 U.S.C. section 360bbb-3(b)(1), unless the authorization is terminated or revoked.  Performed at [facility], [address] [place], [place] [address]   Urine Culture     Status: Abnormal   Collection Time: 08/24/20  7:00 PM   Specimen: Urine, Random  Result Value Ref Range Status   Specimen Description   Final    URINE, RANDOM Performed at [facility], [address], [place], [place] [address]  Special  Requests   Final    NONE Performed at [facility], [address] [place], [place] [address]    Culture 70,000 COLONIES/mL ESCHERICHIA COLI (A)  Final   Report Status 08/27/2020 FINAL  Final   Organism ID, Bacteria ESCHERICHIA COLI (A)  Final      Susceptibility   Escherichia coli - MIC*    AMPICILLIN >=32 RESISTANT Resistant     CEFAZOLIN <=4 SENSITIVE Sensitive     CEFEPIME <=0.12 SENSITIVE Sensitive     CEFTRIAXONE <=0.25 SENSITIVE Sensitive     CIPROFLOXACIN <=0.25 SENSITIVE Sensitive     GENTAMICIN <=1 SENSITIVE Sensitive     IMIPENEM <=0.25 SENSITIVE Sensitive     NITROFURANTOIN <=16 SENSITIVE Sensitive     TRIMETH/SULFA >=320 RESISTANT Resistant     AMPICILLIN/SULBACTAM 16 INTERMEDIATE Intermediate     PIP/TAZO <=4 SENSITIVE Sensitive     * 70,000 COLONIES/mL ESCHERICHIA COLI    Procedures and diagnostic studies:  EEG  Result Date: 08/28/2020 Charlsie QuestYadav, Priyanka O, MD     08/28/2020  3:41 PM Patient Name: Jonathan AngelKevin L Richoux MRN: [id number] Epilepsy Attending: Charlsie QuestPriyanka O Yadav Referring Physician/Provider: Dr Lurene ShadowBernard Cammy Sanjurjo Date: 08/28/2020 Duration: 23.07 mins Patient history: 34 year old male with autism, epilepsy with altered mental status.  EEG to evaluate for seizures. Level of alertness: Awake, asleep AEDs during EEG study: Felbamate Technical aspects: This EEG study was done with scalp electrodes positioned according to the 10-20 International system of electrode placement. Electrical activity was acquired at a sampling rate of 500Hz  and reviewed with a high frequency filter of 70Hz  and a low frequency filter of 1Hz . EEG data were recorded continuously and digitally stored. Description: No clear posterior dominant rhythm was seen. Sleep was characterized by vertex waves, sleep spindles (12 to 14 Hz), maximal frontocentral region.  EEG showed continuous generalized 3 to 5 Hz theta-delta slowing with overriding 15 to 18 Hz generalized beta activity.  Multifocal spikes were seen,  in left frontotemporal, right frontotemporal and bilateral frontocentral region.  Hyperventilation and photic stimulation were not performed.   ABNORMALITY -Continuous slow, generalized -Spikes, left frontotemporal, right frontotemporal and bilateral frontocentral region IMPRESSION: This study is consistent with patient's known history of epilepsy, which could be fragments of generalized epilepsy  or multifocal epilepsy.  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 Charlsie QuestPriyanka O Yadav   DG Abd 1 View  Result Date: 08/29/2020 CLINICAL DATA:  Poor p.o. intake.  History of constipation EXAM: ABDOMEN - 1 VIEW COMPARISON:  08/26/2020 FINDINGS: Gaseous distention of large in small bowel diffusely. Small amount of stool in the colon. No free air. Markedly dilated sigmoid colon as noted on prior CT 08/21/2020 Normal skeletal structures IMPRESSION: Moderate distention of large and small bowel loops diffusely extending to the sigmoid colon. Findings compatible with ileus Electronically Signed   By: Marlan Palauharles  Clark M.D.   On: 08/29/2020 11:36   CT HEAD WO CONTRAST  Result Date: 08/28/2020 CLINICAL DATA:  34 year old male male with unexplained lethargy, autism. EXAM: CT HEAD WITHOUT CONTRAST TECHNIQUE: Contiguous axial images were obtained from the base of the skull through the vertex without intravenous contrast. COMPARISON:  None. FINDINGS: Brain: Cerebellar volume is below that expected for age. Brainstem volume also appears somewhat reduced. But supratentorial volume appears more normal. No midline shift, ventriculomegaly, mass effect, evidence of mass lesion, intracranial hemorrhage or evidence of cortically based acute infarction. Gray-white matter differentiation is within normal limits throughout the brain.  Vascular: No suspicious intracranial vascular hyperdensity. Skull: Negative. Sinuses/Orbits: Hyperplastic paranasal sinuses,  mastoid and sphenoid wing air cells are clear. Other: Visualized orbits and scalp soft tissues are within normal limits. IMPRESSION: 1.  No acute intracranial abnormality. 2. Nonspecific volume loss involving the cerebellum and possibly also the brainstem. Electronically Signed   By: Odessa Fleming M.D.   On: 08/28/2020 15:20               LOS: 7 days   Makaley Storts  [facility]   Pager on [email or web address]. If 7PM-7AM, please contact night-coverage at [email or web address]     08/29/2020, 4:48 PM              Progress Note    FINBAR NIPPERT  OEU:[id number] DOB: Feb 17, 1986  DOA: 08/21/2020 PCP: Almetta Lovely, [facility]      Brief Narrative:    Medical records reviewed and are as summarized below:  Jonathan Kirby is a 34 y.o. male       Assessment/Plan:   Principal Problem:   Abdominal pain Active Problems:   Autism spectrum disorder with accompanying language impairment and intellectual disability, requiring very substantial support   Constipation   Decreased oral intake   Hypokalemia   Hypomagnesemia   Ogilvie syndrome   Acute metabolic encephalopathy   Aspiration pneumonia of both lower lobes due to gastric secretions (HCC)   Severe protein-calorie malnutrition [name]: less than 60% of standard weight) (HCC)   Malnutrition of moderate degree   Nutrition Problem: Moderate Malnutrition Etiology: social / environmental circumstances (autism, bedbound)  Signs/Symptoms: mild fat depletion,severe fat depletion,moderate muscle depletion,severe muscle depletion   Body mass index is 17.79 kg/m.  (Underweight)   Probable aspiration pneumonia, leukocytosis: Leukocytosis has resolved.  Continue IV Zosyn till tomorrow.  Colonic ileus with Ogilvie syndrome, constipation: Abdominal x-ray done today showed recurrence of ileus with moderate distention of large and small bowel loops.  Additional laxatives (enema or suppository) were offered but parents declined  and said they wanted to wait for input from the gastroenterologist.  Dr. Allegra Lai, gastroenterologist, has been notified and she will see the patient today. She recommended rectal tube  Poor oral intake, moderate protein caloric malnutrition, reflux/vomiting, history of erosive esophagitis: s/p EGD on 09/08/2020 which showed erythema in the gastric antrum.  Continue IV Protonix and IV fluids.    Lethargy/altered mental status: Mental status is a little better today.  CT scan of the head done on 08/2020 did not show any acute abnormality.  No epileptic activity on EEG done on 08/28/2020.   Autism intellectual disability: Continue supportive care  Persistent hypokalemia: Patient was given extra doses of IV potassium today.  Continue to monitor potassium and replete as needed.  IV magnesium sulfate was given for low normal magnesium level.  Mild hypernatremia: IV fluids has been changed from D5NS with KCl to 0.45% NS with KCl because of persistent hypokalemia. Monitor sodium level  Plan of care was discussed with his parents at the bedside.    Diet Order            Diet NPO time specified Except for: Sips with Meds  Diet effective now                    Consultants:  Gastroenterologist  Procedures:  EGD on 08/28/2020 which showed erythema in the gastric antrum    Medications:   . enoxaparin (LOVENOX) injection  40 mg Subcutaneous Q24H  . feeding supplement  237 mL Oral  TID BM  . felbamate  900 mg Oral Q12H  . haloperidol  2 mg Oral Daily  . linaclotide  290 mcg Oral Daily  .  morphine injection  1 mg Intravenous Once  . pantoprazole (PROTONIX) IV  40 mg Intravenous Q12H  . prenatal vitamin w/FE, FA  1 tablet Oral Daily  . sennosides  15 mL Oral BID   Continuous Infusions: . sodium chloride    . piperacillin-tazobactam 3.375 g (08/29/20 1043)  . potassium chloride 10 mEq (08/29/20 1604)  . sodium chloride 0.45 % with kcl 75 mL/hr at 08/29/20 0929     Anti-infectives  (From admission, onward)   Start     Dose/Rate Route Frequency Ordered Stop   08/26/20 0815  piperacillin-tazobactam (ZOSYN) IVPB 3.375 g        3.375 g 12.5 mL/hr over 240 Minutes Intravenous Every 8 hours 08/26/20 0806 08/30/20 2359             Family Communication/Anticipated D/C date and plan/Code Status   DVT prophylaxis: enoxaparin (LOVENOX) injection 40 mg Start: 08/21/20 2200     Code Status: Full Code  Family Communication: Plan discussed with his parents at the bedside Disposition Plan:    Status is: Inpatient  Remains inpatient appropriate because:Ongoing diagnostic testing needed not appropriate for outpatient work up and IV treatments appropriate due to intensity of illness or inability to take PO   Dispo: The patient is from: Home              Anticipated d/c is to: Home              Anticipated d/c date is: 2 days              Patient currently is not medically stable to d/c.           Subjective:   Interval events noted.  Her parents at the bedside.  They are concerned that her abdomen is becoming more distended.  He has not had any significant bowel movement.  He only had "a smear" despite using laxatives yesterday.   Objective:    Vitals:   08/28/20 1523 08/28/20 2032 08/28/20 2340 08/29/20 0910  BP: (!) 128/91 (!) 129/91 127/82 124/83  Pulse: 77 78 73 61  Resp: Temp: 99.2 F (37.3 C) 98.2 F (36.8 C) 98.3 F (36.8 C) 98.2 F (36.8 C)  TempSrc: Oral Oral  Axillary  SpO2: 99% 99% 99% 99%  Weight:      Height:       No data found.   Intake/Output Summary (Last 24 hours) at 08/29/2020 1648 Last data filed at 08/29/2020 1417 Gross per 24 hour  Intake 0 ml  Output --  Net 0 ml   Filed Weights   08/21/20 1742 08/28/20 0723  Weight: 48.5 kg 48.5 kg    Exam:   GEN: NAD SKIN: Warm and dry EYES: No pallor or icterus ENT: MMM CV: RRR PULM: CTA B ABD: soft, ND, NT, +BS CNS: More awake compared to  yesterday.  He opens his eyes spontaneously.  Nonverbal. EXT: No edema or tenderness       Data Reviewed:   I have personally reviewed following labs and imaging studies:  Labs: Labs show the following:   Basic Metabolic Panel: Recent Labs  Lab 08/25/20 0340 08/25/20 0433 08/26/20 0549 08/27/20 0614 08/28/20 0547 08/29/20 0552 08/29/20 1551  NA  --  138 134* 138 142 147*  --  K  --  3.1* 3.7 3.4* 2.9* 2.8* 3.6  CL  --  107 101 102 104 108  --   CO2  --  26 25 26 27 26   --   GLUCOSE  --  120* 134* 104* 90 92  --   BUN  --  7 8 14 19 17   --   CREATININE  --  0.52* 0.83 0.82 0.72 0.77  --   CALCIUM  --  7.9* 8.2* 8.1* 8.0* 8.0*  --   MG  --  1.6* 1.6* 2.0 1.8 1.8  --   PHOS 2.8  --   --   --  3.6  --   --    GFR Estimated Creatinine Clearance: 89.3 mL/min (by C-G formula based on SCr of 0.77 mg/dL). Liver Function Tests: No results for input(s): AST, ALT, ALKPHOS, BILITOT, PROT, ALBUMIN in the last 168 hours. No results for input(s): LIPASE, AMYLASE in the last 168 hours. Recent Labs  Lab 08/29/20 0552  AMMONIA 15   Coagulation profile No results for input(s): INR, PROTIME in the last 168 hours.  CBC: Recent Labs  Lab 08/24/20 0557 08/26/20 0549 08/27/20 0614 08/28/20 0547 08/29/20 0552  WBC 4.7 20.7* 17.1* 8.8 5.0  NEUTROABS 2.2 16.7* 12.2* 6.6 2.9  HGB 10.3* 11.7* 11.0* 10.4* 10.4*  HCT 31.9* 34.1* 31.8* 30.9* 30.4*  MCV 91.7 86.8 86.9 87.0 86.6  PLT 237 268 233 285 321   Cardiac Enzymes: No results for input(s): CKTOTAL, CKMB, CKMBINDEX, TROPONINI in the last 168 hours. BNP (last 3 results) No results for input(s): PROBNP in the last 8760 hours. CBG: No results for input(s): GLUCAP in the last 168 hours. D-Dimer: No results for input(s): DDIMER in the last 72 hours. Hgb A1c: No results for input(s): HGBA1C in the last 72 hours. Lipid Profile: No results for input(s): CHOL, HDL, LDLCALC, TRIG, CHOLHDL, LDLDIRECT in the last 72 hours. Thyroid  function studies: Recent Labs    08/29/20 0552  TSH 1.318   Anemia work up: Recent Labs    08/28/20 1305  FOLATE 21.2   Sepsis Labs: Recent Labs  Lab 08/26/20 0549 08/27/20 0614 08/28/20 0547 08/29/20 0552  PROCALCITON 0.94 2.74 1.68  --   WBC 20.7* 17.1* 8.8 5.0    Microbiology Recent Results (from the past 240 hour(s))  Resp Panel by RT-PCR (Flu A&B, Covid) Nasopharyngeal Swab     Status: None   Collection Time: 08/21/20  9:33 PM   Specimen: Nasopharyngeal Swab; Nasopharyngeal(NP) swabs in vial transport medium  Result Value Ref Range Status   SARS Coronavirus 2 by RT PCR NEGATIVE NEGATIVE Final    Comment: (NOTE) SARS-CoV-2 target nucleic acids are NOT DETECTED.  The SARS-CoV-2 RNA is generally detectable in upper respiratory specimens during the acute phase of infection. The lowest concentration of SARS-CoV-2 viral copies this assay can detect is 138 copies/mL. A negative result does not preclude SARS-Cov-2 infection and should not be used as the sole basis for treatment or other patient management decisions. A negative result may occur with  improper specimen collection/handling, submission of specimen other than nasopharyngeal swab, presence of viral mutation(s) within the areas targeted by this assay, and inadequate number of viral copies(<138 copies/mL). A negative result must be combined with clinical observations, patient history, and epidemiological information. The expected result is Negative.  Fact Sheet for Patients:  BloggerCourse.com  Fact Sheet for Healthcare Providers:  SeriousBroker.it  This test is no t yet approved or cleared by the  [place] [employer] and  has been authorized for detection and/or diagnosis of SARS-CoV-2 by FDA under an TEFL teacher (EUA). This EUA will remain  in effect (meaning this test can be used) for the duration of the COVID-19 declaration under Section  564(b)(1) of the Act, 21 U.S.C.section 360bbb-3(b)(1), unless the authorization is terminated  or revoked sooner.       Influenza A by PCR NEGATIVE NEGATIVE Final   Influenza B by PCR NEGATIVE NEGATIVE Final    Comment: (NOTE) The Xpert Xpress SARS-CoV-2/FLU/RSV plus assay is intended as an aid in the diagnosis of influenza from Nasopharyngeal swab specimens and should not be used as a sole basis for treatment. Nasal washings and aspirates are unacceptable for Xpert Xpress SARS-CoV-2/FLU/RSV testing.  Fact Sheet for Patients: BloggerCourse.com  Fact Sheet for Healthcare Providers: SeriousBroker.it  This test is not yet approved or cleared by the [place] FDA and has been authorized for detection and/or diagnosis of SARS-CoV-2 by FDA under an Emergency Use Authorization (EUA). This EUA will remain in effect (meaning this test can be used) for the duration of the COVID-19 declaration under Section 564(b)(1) of the Act, 21 U.S.C. section 360bbb-3(b)(1), unless the authorization is terminated or revoked.  Performed at [facility], [address] [place], [place] [address]   Urine Culture     Status: Abnormal   Collection Time: 08/24/20  7:00 PM   Specimen: Urine, Random  Result Value Ref Range Status   Specimen Description   Final    URINE, RANDOM Performed at [facility], [address] [place], [place] [address]    Special Requests   Final    NONE Performed at [facility], [address] [place], [place] [address]    Culture 70,000 COLONIES/mL ESCHERICHIA COLI (A)  Final   Report Status 08/27/2020 FINAL  Final   Organism ID, Bacteria ESCHERICHIA COLI (A)  Final      Susceptibility   Escherichia coli - MIC*    AMPICILLIN >=32 RESISTANT Resistant     CEFAZOLIN <=4 SENSITIVE Sensitive     CEFEPIME <=0.12 SENSITIVE Sensitive     CEFTRIAXONE <=0.25 SENSITIVE Sensitive      CIPROFLOXACIN <=0.25 SENSITIVE Sensitive     GENTAMICIN <=1 SENSITIVE Sensitive     IMIPENEM <=0.25 SENSITIVE Sensitive     NITROFURANTOIN <=16 SENSITIVE Sensitive     TRIMETH/SULFA >=320 RESISTANT Resistant     AMPICILLIN/SULBACTAM 16 INTERMEDIATE Intermediate     PIP/TAZO <=4 SENSITIVE Sensitive     * 70,000 COLONIES/mL ESCHERICHIA COLI    Procedures and diagnostic studies:  EEG  Result Date: 08/28/2020 Charlsie Quest, MD     08/28/2020  3:41 PM Patient Name: ARDELL Kirby MRN: [id number] Epilepsy Attending: Charlsie Quest Referring Physician/Provider: Dr Lurene Shadow Date: 08/28/2020 Duration: 23.07 mins Patient history: 34 year old male with autism, epilepsy with altered mental status.  EEG to evaluate for seizures. Level of alertness: Awake, asleep AEDs during EEG study: Felbamate Technical aspects: This EEG study was done with scalp electrodes positioned according to the 10-20 International system of electrode placement. Electrical activity was acquired at a sampling rate of 500Hz  and reviewed with a high frequency filter of 70Hz  and a low frequency filter of 1Hz . EEG data were recorded continuously and digitally stored. Description: No clear posterior dominant rhythm was seen. Sleep was characterized by vertex waves, sleep spindles (12 to 14 Hz), maximal frontocentral region.  EEG showed continuous generalized 3 to 5 Hz theta-delta  slowing with overriding 15 to 18 Hz generalized beta activity.  Multifocal spikes were seen, in left frontotemporal, right frontotemporal and bilateral frontocentral region.  Hyperventilation and photic stimulation were not performed.   ABNORMALITY -Continuous slow, generalized -Spikes, left frontotemporal, right frontotemporal and bilateral frontocentral region IMPRESSION: This study is consistent with patient's known history of epilepsy, which could be fragments of generalized epilepsy  or multifocal epilepsy.  Additionally there is evidence of moderate diffuse  encephalopathy, which could be part of patient's epileptic encephalopathy. Charlsie Quest   DG Abd 1 View  Result Date: 08/29/2020 CLINICAL DATA:  Poor p.o. intake.  History of constipation EXAM: ABDOMEN - 1 VIEW COMPARISON:  08/26/2020 FINDINGS: Gaseous distention of large in small bowel diffusely. Small amount of stool in the colon. No free air. Markedly dilated sigmoid colon as noted on prior CT 08/21/2020 Normal skeletal structures IMPRESSION: Moderate distention of large and small bowel loops diffusely extending to the sigmoid colon. Findings compatible with ileus Electronically Signed   By: Marlan Palau M.D.   On: 08/29/2020 11:36   CT HEAD WO CONTRAST  Result Date: 08/28/2020 CLINICAL DATA:  34 year old male male with unexplained lethargy, autism. EXAM: CT HEAD WITHOUT CONTRAST TECHNIQUE: Contiguous axial images were obtained from the base of the skull through the vertex without intravenous contrast. COMPARISON:  None. FINDINGS: Brain: Cerebellar volume is below that expected for age. Brainstem volume also appears somewhat reduced. But supratentorial volume appears more normal. No midline shift, ventriculomegaly, mass effect, evidence of mass lesion, intracranial hemorrhage or evidence of cortically based acute infarction. Gray-white matter differentiation is within normal limits throughout the brain. Vascular: No suspicious intracranial vascular hyperdensity. Skull: Negative. Sinuses/Orbits: Hyperplastic paranasal sinuses, mastoid and sphenoid wing air cells are clear. Other: Visualized orbits and scalp soft tissues are within normal limits. IMPRESSION: 1.  No acute intracranial abnormality. 2. Nonspecific volume loss involving the cerebellum and possibly also the brainstem. Electronically Signed   By: Odessa Fleming M.D.   On: 08/28/2020 15:20               LOS: 7 days   Lessa Huge  [facility]   Pager on [email or web address]. If 7PM-7AM, please contact night-coverage at  [email or web address]     08/29/2020, 4:48 PM

## 2020-08-30 DIAGNOSIS — K567 Ileus, unspecified: Secondary | ICD-10-CM

## 2020-08-30 LAB — PHOSPHORUS: Phosphorus: 3.6 mg/dL (ref 2.5–4.6)

## 2020-08-30 LAB — BASIC METABOLIC PANEL
Anion gap: 12 (ref 5–15)
BUN: 15 mg/dL (ref 6–20)
CO2: 25 mmol/L (ref 22–32)
Calcium: 8.4 mg/dL — ABNORMAL LOW (ref 8.9–10.3)
Chloride: 111 mmol/L (ref 98–111)
Creatinine, Ser: 0.82 mg/dL (ref 0.61–1.24)
GFR, Estimated: >60 mL/min (ref >60)
Glucose, Bld: 75 mg/dL (ref 70–99)
Potassium: 4.6 mmol/L (ref 3.5–5.1)
Sodium: 148 mmol/L — ABNORMAL HIGH (ref 135–145)

## 2020-08-30 LAB — MAGNESIUM: Magnesium: 2 mg/dL (ref 1.7–2.4)

## 2020-08-30 MED ORDER — POTASSIUM CL IN DEXTROSE 5% 20 MEQ/L IV SOLN
20.0000 meq | INTRAVENOUS | Status: DC
  Administered 2020-08-30 – 2020-08-31 (×2): 20 meq via INTRAVENOUS
  Filled 2020-08-30 (×3): qty 1000

## 2020-08-30 NOTE — Progress Notes (Addendum)
Progress Note    Jonathan Kirby  XQJ:[id number] DOB: [birth year]  DOA: 08/21/2020 PCP: Almetta Lovely, [facility]      Brief Narrative:    Medical records reviewed and are as summarized below:  Jonathan Kirby is a 34 y.o. male       Assessment/Plan:   Principal Problem:   Abdominal pain Active Problems:   Autism spectrum disorder with accompanying language impairment and intellectual disability, requiring very substantial support   Constipation   Decreased oral intake   Hypokalemia   Hypomagnesemia   Ogilvie syndrome   Acute metabolic encephalopathy   Aspiration pneumonia of both lower lobes due to gastric secretions (HCC)   Severe protein-calorie malnutrition [name]: less than 60% of standard weight) (HCC)   Malnutrition of moderate degree   Nutrition Problem: Moderate Malnutrition Etiology: social / environmental circumstances (autism, bedbound)  Signs/Symptoms: mild fat depletion,severe fat depletion,moderate muscle depletion,severe muscle depletion   Body mass index is 17.79 kg/m.  (Underweight)   Probable aspiration pneumonia, leukocytosis: Leukocytosis has resolved.  Discontinue IV Zosyn  Colonic ileus with Ogilvie syndrome, constipation: Improving.  He has been able to pass stool through the rectal tube.  Advance diet as tolerated.  Start with full liquid diet.  Poor oral intake, moderate protein caloric malnutrition, reflux/vomiting, history of erosive esophagitis: s/p EGD on 09/08/2020 which showed erythema in the gastric antrum.  Continue IV Protonix and IV fluids.    Lethargy/altered mental status: Mental status is close to baseline.  CT scan of the head done on 08/28/2020 did not show any acute abnormality.  No epileptic activity on EEG done on 08/28/2020.  Neurologist has signed off.  Autism with intellectual disability: Continue supportive care  Persistent hypokalemia: Potassium level has improved.  Continue potassium repletion because of poor  oral intake.    Mild hypernatremia: IV fluids has been changed from half NS with KCl to D5 with KCl.  Monitor BMP.  Plan of care was discussed with his mother at the bedside.    Diet Order            Diet full liquid Room service appropriate? Yes; Fluid consistency: Thin  Diet effective now                    Consultants:  Gastroenterologist  Procedures:  EGD on 08/28/2020 which showed erythema in the gastric antrum    Medications:   . enoxaparin (LOVENOX) injection  40 mg Subcutaneous Q24H  . feeding supplement  237 mL Oral TID BM  . felbamate  900 mg Oral Q12H  .  morphine injection  1 mg Intravenous Once  . pantoprazole (PROTONIX) IV  40 mg Intravenous Q12H   Continuous Infusions: . sodium chloride    . dextrose 5 % with KCl 20 mEq / L 20 mEq (08/30/20 1006)  . piperacillin-tazobactam 3.375 g (08/30/20 1109)     Anti-infectives (From admission, onward)   Start     Dose/Rate Route Frequency Ordered Stop   08/26/20 0815  piperacillin-tazobactam (ZOSYN) IVPB 3.375 g        3.375 g 12.5 mL/hr over 240 Minutes Intravenous Every 8 hours 08/26/20 0806 08/30/20 2359             Family Communication/Anticipated D/C date and plan/Code Status   DVT prophylaxis: enoxaparin (LOVENOX) injection 40 mg Start: 08/21/20 2200     Code Status: Full Code  Family Communication: Plan discussed with his parents at the bedside Disposition Plan:  Status is: Inpatient  Remains inpatient appropriate because:Ongoing diagnostic testing needed not appropriate for outpatient work up and IV treatments appropriate due to intensity of illness or inability to take PO   Dispo: The patient is from: Home              Anticipated d/c is to: Home              Anticipated d/c date is: 2 days              Patient currently is not medically stable to d/c.           Subjective:   Interval events noted.  Her mother is at the bedside.  Her mother said that her mental  status is close to baseline.  He has been more alert and has been able to take some ice chips.  Objective:    Vitals:   08/29/20 1958 08/30/20 0319 08/30/20 0810 08/30/20 1207  BP: 114/78 118/80 115/84 117/81  Pulse: 67 65 62 63  Resp: 16 15 17 16   Temp:  97.9 F (36.6 C) 97.9 F (36.6 C) 97.9 F (36.6 C)  TempSrc: Oral  Axillary   SpO2: 97% 98% 99% 100%  Weight:      Height:       No data found.   Intake/Output Summary (Last 24 hours) at 08/30/2020 1527 Last data filed at 08/30/2020 0330 Gross per 24 hour  Intake 727.26 ml  Output 400 ml  Net 327.26 ml   Filed Weights   08/21/20 1742 08/28/20 0723  Weight: 48.5 kg 48.5 kg    Exam:  GEN: NAD SKIN: Warm and dry EYES: Anicteric ENT: MMM CV: RRR PULM: CTA B ABD: soft, distended but improved, NT, +BS, + rectal tube in place CNS: Alert but nonverbal, non focal EXT: No edema or tenderness        Data Reviewed:   I have personally reviewed following labs and imaging studies:  Labs: Labs show the following:   Basic Metabolic Panel: Recent Labs  Lab 08/25/20 0340 08/25/20 0433 08/26/20 0549 08/27/20 0614 08/28/20 0547 08/29/20 0552 08/29/20 1551 08/30/20 0458  NA  --    < > 134* 138 142 147*  --  148*  K  --    < > 3.7 3.4* 2.9* 2.8* 3.6 4.6  CL  --    < > 101 102 104 108  --  111  CO2  --    < > 25 26 27 26   --  25  GLUCOSE  --    < > 134* 104* 90 92  --  75  BUN  --    < > 8 14 19 17   --  15  CREATININE  --    < > 0.83 0.82 0.72 0.77  --  0.82  CALCIUM  --    < > 8.2* 8.1* 8.0* 8.0*  --  8.4*  MG  --    < > 1.6* 2.0 1.8 1.8  --  2.0  PHOS 2.8  --   --   --  3.6  --   --  3.6   < > = values in this interval not displayed.   GFR Estimated Creatinine Clearance: 87.1 mL/min (by C-G formula based on SCr of 0.82 mg/dL). Liver Function Tests: No results for input(s): AST, ALT, ALKPHOS, BILITOT, PROT, ALBUMIN in the last 168 hours. No results for input(s): LIPASE, AMYLASE in the last 168  hours. Recent Labs  Lab 08/29/20 0552  AMMONIA 15   Coagulation profile No results for input(s): INR, PROTIME in the last 168 hours.  CBC: Recent Labs  Lab 08/24/20 0557 08/26/20 0549 08/27/20 0614 08/28/20 0547 08/29/20 0552  WBC 4.7 20.7* 17.1* 8.8 5.0  NEUTROABS 2.2 16.7* 12.2* 6.6 2.9  HGB 10.3* 11.7* 11.0* 10.4* 10.4*  HCT 31.9* 34.1* 31.8* 30.9* 30.4*  MCV 91.7 86.8 86.9 87.0 86.6  PLT 237 268 233 285 321   Cardiac Enzymes: No results for input(s): CKTOTAL, CKMB, CKMBINDEX, TROPONINI in the last 168 hours. BNP (last 3 results) No results for input(s): PROBNP in the last 8760 hours. CBG: No results for input(s): GLUCAP in the last 168 hours. D-Dimer: No results for input(s): DDIMER in the last 72 hours. Hgb A1c: No results for input(s): HGBA1C in the last 72 hours. Lipid Profile: No results for input(s): CHOL, HDL, LDLCALC, TRIG, CHOLHDL, LDLDIRECT in the last 72 hours. Thyroid function studies: Recent Labs    08/29/20 0552  TSH 1.318   Anemia work up: Recent Labs    08/28/20 1305  FOLATE 21.2   Sepsis Labs: Recent Labs  Lab 08/26/20 0549 08/27/20 0614 08/28/20 0547 08/29/20 0552  PROCALCITON 0.94 2.74 1.68  --   WBC 20.7* 17.1* 8.8 5.0    Microbiology Recent Results (from the past 240 hour(s))  Resp Panel by RT-PCR (Flu A&B, Covid) Nasopharyngeal Swab     Status: None   Collection Time: 08/21/20  9:33 PM   Specimen: Nasopharyngeal Swab; Nasopharyngeal(NP) swabs in vial transport medium  Result Value Ref Range Status   SARS Coronavirus 2 by RT PCR NEGATIVE NEGATIVE Final    Comment: (NOTE) SARS-CoV-2 target nucleic acids are NOT DETECTED.  The SARS-CoV-2 RNA is generally detectable in upper respiratory specimens during the acute phase of infection. The lowest concentration of SARS-CoV-2 viral copies this assay can detect is 138 copies/mL. A negative result does not preclude SARS-Cov-2 infection and should not be used as the sole basis for  treatment or other patient management decisions. A negative result may occur with  improper specimen collection/handling, submission of specimen other than nasopharyngeal swab, presence of viral mutation(s) within the areas targeted by this assay, and inadequate number of viral copies(<138 copies/mL). A negative result must be combined with clinical observations, patient history, and epidemiological information. The expected result is Negative.  Fact Sheet for Patients:  BloggerCourse.com  Fact Sheet for Healthcare Providers:  SeriousBroker.it  This test is no t yet approved or cleared by the [place] FDA and  has been authorized for detection and/or diagnosis of SARS-CoV-2 by FDA under an Emergency Use Authorization (EUA). This EUA will remain  in effect (meaning this test can be used) for the duration of the COVID-19 declaration under Section 564(b)(1) of the Act, 21 U.S.C.section 360bbb-3(b)(1), unless the authorization is terminated  or revoked sooner.       Influenza A by PCR NEGATIVE NEGATIVE Final   Influenza B by PCR NEGATIVE NEGATIVE Final    Comment: (NOTE) The Xpert Xpress SARS-CoV-2/FLU/RSV plus assay is intended as an aid in the diagnosis of influenza from Nasopharyngeal swab specimens and should not be used as a sole basis for treatment. Nasal washings and aspirates are unacceptable for Xpert Xpress SARS-CoV-2/FLU/RSV testing.  Fact Sheet for Patients: BloggerCourse.com  Fact Sheet for Healthcare Providers: SeriousBroker.it  This test is not yet approved or cleared by the [place] FDA and has been authorized for detection and/or diagnosis of SARS-CoV-2 by FDA under  an Emergency Use Authorization (EUA). This EUA will remain in effect (meaning this test can be used) for the duration of the COVID-19 declaration under Section 564(b)(1) of the Act, 21  U.S.C. section 360bbb-3(b)(1), unless the authorization is terminated or revoked.  Performed at [facility], [address] [place], [place] [address]   Urine Culture     Status: Abnormal   Collection Time: 08/24/20  7:00 PM   Specimen: Urine, Random  Result Value Ref Range Status   Specimen Description   Final    URINE, RANDOM Performed at [facility], [address] [place], [place] [address]    Special Requests   Final    NONE Performed at [facility], [address] [place], [place] [address]    Culture 70,000 COLONIES/mL ESCHERICHIA COLI (A)  Final   Report Status 08/27/2020 FINAL  Final   Organism ID, Bacteria ESCHERICHIA COLI (A)  Final      Susceptibility   Escherichia coli - MIC*    AMPICILLIN >=32 RESISTANT Resistant     CEFAZOLIN <=4 SENSITIVE Sensitive     CEFEPIME <=0.12 SENSITIVE Sensitive     CEFTRIAXONE <=0.25 SENSITIVE Sensitive     CIPROFLOXACIN <=0.25 SENSITIVE Sensitive     GENTAMICIN <=1 SENSITIVE Sensitive     IMIPENEM <=0.25 SENSITIVE Sensitive     NITROFURANTOIN <=16 SENSITIVE Sensitive     TRIMETH/SULFA >=320 RESISTANT Resistant     AMPICILLIN/SULBACTAM 16 INTERMEDIATE Intermediate     PIP/TAZO <=4 SENSITIVE Sensitive     * 70,000 COLONIES/mL ESCHERICHIA COLI    Procedures and diagnostic studies:  DG Abd 1 View  Result Date: 08/29/2020 CLINICAL DATA:  Poor p.o. intake.  History of constipation EXAM: ABDOMEN - 1 VIEW COMPARISON:  08/26/2020 FINDINGS: Gaseous distention of large in small bowel diffusely. Small amount of stool in the colon. No free air. Markedly dilated sigmoid colon as noted on prior CT 08/21/2020 Normal skeletal structures IMPRESSION: Moderate distention of large and small bowel loops diffusely extending to the sigmoid colon. Findings compatible with ileus Electronically Signed   By: Marlan Palau M.D.   On: 08/29/2020 11:36               LOS: 8 days   Janalyn Higby  [facility]   Pager on [email or web address]. If 7PM-7AM, please contact night-coverage at [email or web address]     08/30/2020, 3:27 PM

## 2020-08-30 NOTE — Progress Notes (Signed)
Wyline Mood , MD [address], [address], [place], [place], [address] [address], [address], [place], [place], [address] Phone: [phone]  Fax: [phone]   Jonathan Kirby is being followed for Ogilvie syndrome  Subjective: He appears comfortable laying in the bed.  Does not provide much history by himself.  Mother by the bedside says he appears a bit better today.  Once the place the colitis tube yesterday he had a good stool output which was higher than what it was previously been placed initially on admission.  Had some pudding to eat earlier today.   Objective: Vital signs in last 24 hours: Vitals:   08/29/20 1958 08/30/20 0319 08/30/20 0810 08/30/20 1207  BP: 114/78 118/80 115/84 117/81  Pulse: 67 65 62 63  Resp: 16 15 17 16   Temp:  97.9 F (36.6 C) 97.9 F (36.6 C) 97.9 F (36.6 C)  TempSrc: Oral  Axillary   SpO2: 97% 98% 99% 100%  Weight:      Height:       Weight change:   Intake/Output Summary (Last 24 hours) at 08/30/2020 1428 Last data filed at 08/30/2020 0330 Gross per 24 hour  Intake 727.26 ml  Output 400 ml  Net 327.26 ml     Exam:  Abdomen: soft, nontender, normal bowel sounds   Lab Results: @LABTEST2 @ Micro Results: Recent Results (from the past 240 hour(s))  Resp Panel by RT-PCR (Flu A&B, Covid) Nasopharyngeal Swab     Status: None   Collection Time: 08/21/20  9:33 PM   Specimen: Nasopharyngeal Swab; Nasopharyngeal(NP) swabs in vial transport medium  Result Value Ref Range Status   SARS Coronavirus 2 by RT PCR NEGATIVE NEGATIVE Final    Comment: (NOTE) SARS-CoV-2 target nucleic acids are NOT DETECTED.  The SARS-CoV-2 RNA is generally detectable in upper respiratory specimens during the acute phase of infection. The lowest concentration of SARS-CoV-2 viral copies this assay can detect is 138 copies/mL. A negative result does not preclude SARS-Cov-2 infection and should not be used as the sole basis for treatment or other patient  management decisions. A negative result may occur with  improper specimen collection/handling, submission of specimen other than nasopharyngeal swab, presence of viral mutation(s) within the areas targeted by this assay, and inadequate number of viral copies(<138 copies/mL). A negative result must be combined with clinical observations, patient history, and epidemiological information. The expected result is Negative.  Fact Sheet for Patients:   Fact Sheet for Healthcare Providers:  14/02/21  This test is no t yet approved or cleared by the [employer] FDA and  has been authorized for detection and/or diagnosis of SARS-CoV-2 by FDA under an Emergency Use Authorization (EUA). This EUA will remain  in effect (meaning this test can be used) for the duration of the COVID-19 declaration under Section 564(b)(1) of the Act, 21 U.S.C.section 360bbb-3(b)(1), unless the authorization is terminated  or revoked sooner.       Influenza A by PCR NEGATIVE NEGATIVE Final   Influenza B by PCR NEGATIVE NEGATIVE Final    Comment: (NOTE) The Xpert Xpress SARS-CoV-2/FLU/RSV plus assay is intended as an aid in the diagnosis of influenza from Nasopharyngeal swab specimens and should not be used as a sole basis for treatment. Nasal washings and aspirates are unacceptable for Xpert Xpress SARS-CoV-2/FLU/RSV testing.  Fact Sheet for Patients: [employer]ker[employer]it  Fact Sheet for Healthcare Providers: [place]  This test is not yet approved or cleared by the [employer] FDA and has  been authorized for detection and/or diagnosis of SARS-CoV-2 by FDA under an Emergency Use Authorization (EUA). This EUA will remain in effect (meaning this test can be used) for the duration of the COVID-19 declaration under Section 564(b)(1) of the Act, 21 U.S.C. section 360bbb-3(b)(1),  unless the authorization is terminated or revoked.  Performed at [facility], [address] [place], [place] [address]   Urine Culture     Status: Abnormal   Collection Time: 08/24/20  7:00 PM   Specimen: Urine, Random  Result Value Ref Range Status   Specimen Description   Final    URINE, RANDOM Performed at [facility], [address] [place], [place] [address]    Special Requests   Final    NONE Performed at [facility], [address] [place], [place] [address]    Culture 70,000 COLONIES/mL ESCHERICHIA COLI (A)  Final   Report Status 08/27/2020 FINAL  Final   Organism ID, Bacteria ESCHERICHIA COLI (A)  Final      Susceptibility   Escherichia coli - MIC*    AMPICILLIN >=32 RESISTANT Resistant     CEFAZOLIN <=4 SENSITIVE Sensitive     CEFEPIME <=0.12 SENSITIVE Sensitive     CEFTRIAXONE <=0.25 SENSITIVE Sensitive     CIPROFLOXACIN <=0.25 SENSITIVE Sensitive     GENTAMICIN <=1 SENSITIVE Sensitive     IMIPENEM <=0.25 SENSITIVE Sensitive     NITROFURANTOIN <=16 SENSITIVE Sensitive     TRIMETH/SULFA >=320 RESISTANT Resistant     AMPICILLIN/SULBACTAM 16 INTERMEDIATE Intermediate     PIP/TAZO <=4 SENSITIVE Sensitive     * 70,000 COLONIES/mL ESCHERICHIA COLI   Studies/Results: DG Abd 1 View  Result Date: 08/29/2020 CLINICAL DATA:  Poor p.o. intake.  History of constipation EXAM: ABDOMEN - 1 VIEW COMPARISON:  08/26/2020 FINDINGS: Gaseous distention of large in small bowel diffusely. Small amount of stool in the colon. No free air. Markedly dilated sigmoid colon as noted on prior CT 08/21/2020 Normal skeletal structures IMPRESSION: Moderate distention of large and small bowel loops diffusely extending to the sigmoid colon. Findings compatible with ileus Electronically Signed   By: Marlan Palau M.D.   On: 08/29/2020 11:36   Medications: I have reviewed the patient's current medications. Scheduled Meds: . enoxaparin (LOVENOX) injection  40 mg  Subcutaneous Q24H  . feeding supplement  237 mL Oral TID BM  . felbamate  900 mg Oral Q12H  .  morphine injection  1 mg Intravenous Once  . pantoprazole (PROTONIX) IV  40 mg Intravenous Q12H   Continuous Infusions: . sodium chloride    . dextrose 5 % with KCl 20 mEq / L 20 mEq (08/30/20 1006)  . piperacillin-tazobactam 3.375 g (08/30/20 1109)   PRN Meds:.ondansetron **OR** ondansetron (ZOFRAN) IV   Assessment: Principal Problem:   Abdominal pain Active Problems:   Autism spectrum disorder with accompanying language impairment and intellectual disability, requiring very substantial support   Constipation   Decreased oral intake   Hypokalemia   Hypomagnesemia   Ogilvie syndrome   Acute metabolic encephalopathy   Aspiration pneumonia of both lower lobes due to gastric secretions (HCC)   Severe protein-calorie malnutrition [name]: less than 60% of standard weight) (HCC)   Malnutrition of moderate degree  History of severe autism.  Progressive decreased oral intake.  Malnutrition secondary to decreased oral intake.  Admitted with low potassium and Ogilvie syndrome.  He has improved in terms of his abdominal distention which she does not have presently.  Good output with  a flatus tube, started tolerating p.o. intake today.  Potassium has improved since yesterday.  I had a long discussion with the mother and father spent over 45 minutes discussing acute issues as well as long-term options.  Plan: 1.  Gradually increase diet consider taking out flatus tube tomorrow and watch to ensure that he is passing gas spontaneously.  If he does can plan to send home in 2 to 3 days time. 2.  In terms of his inability to pass stool/constipation.  Has failed lactulose, inability to take MiraLAX orally, failed Linzess.  Mother feels that he did best with Trulance.  She is willing to retry Trulance along with senna and I also suggested MiraLAX could be added as needed down the road.  Other option which he  has not been tried is Motegrity which I suggested.  I also discussed long-term options in terms of nutrition would include a G-tube and consideration of an end ileostomy for recurrent Ogilvie syndrome.  I suggested to establish care with a surgeon to discuss options as an outpatient.  Mother wanted to know whether she would benefit from a second opinion with GI and I have welcomed that option.  I suggested she should speak to her gastroenterologist at [facility] who probably would say the same thing that we had suggested but she should consider it as an option.  Follow-up with Dr. Maximino Greenland as an outpatient.  3.  Ensure potassium is over 4.0 and magnesium over 2.0.  4.  Continue outpatient PPI.  I will sign off.  Please call me if any further GI concerns or questions.  We would like to thank you for the opportunity to participate in the care of Jonathan Kirby.    LOS: 8 days   Wyline Mood, MD 08/30/2020, 2:28 PM

## 2020-08-30 NOTE — Evaluation (Signed)
Physical Therapy Evaluation Patient Details Name: Jonathan Kirby MRN: [id number] DOB: 1986/05/08 Today's Date: 08/30/2020   History of Present Illness  Jonathan Kirby is a 34yoM who [facility] on [facility]/21[facility]20 c ABD distention, pain.   PMH: severe autism (nonverbal baseline), At baseline pt is fully independent with mobility, needs assist with ADL.  Clinical Impression  Pt admitted with above diagnosis. Pt currently with functional limitations due to the deficits listed below (see "PT Problem List"). Upon entry, pt in bed, awake, minimally interactive, nonverbal per baseline. Mother at bedside provides all background info and initiates most mobility attempts. The pt is alert, calm, somewhat interactive to mother, but easily agitated, not common, likely due to feeling ill. Mother able to provide info regarding prior level of function, both in tolerance and independence. Patient's performance this date reveals decreased desire, independence, and tolerance in performing all basic mobility required for performance of activities of daily living. Pt also has a rectal tube in place which would preclude extensive overground gait and stairs, however, pt does not desire to sit at EOB. Pt may require additional DME, close physical assistance, and cues for safe participate in mobility. Therapy services will continue to focus on caregiver education due to patient's cognitive limitations in participation. Parents have been instrumental inpatient mobilizing in prior admission, however pt now has more lines/leads limiting. Pt will benefit from skilled PT intervention to increase independence and safety with basic mobility in preparation for discharge to the venue listed below.       Follow Up Recommendations Home health PT    Equipment Recommendations  None recommended by PT    Recommendations for Other Services       Precautions / Restrictions Precautions Precautions: Fall Restrictions Weight Bearing Restrictions:  No      Mobility  Bed Mobility Overal bed mobility: Needs Assistance Bed Mobility:  (pt refuses moving to EOB, in pain)           General bed mobility comments: Mother and Jonathan Kirby provide +2 for safe repositioning of trunk lean and caudal slump    Transfers Overall transfer level:  (pt refusing mobility at this time, feels poorly, still having pain issues.)                  Ambulation/Gait                Stairs            Wheelchair Mobility    Modified Rankin (Stroke Patients Only)       Balance Overall balance assessment: No apparent balance deficits (not formally assessed)                                           Pertinent Vitals/Pain Pain Assessment: Faces Faces Pain Scale: Hurts even more Pain Location: ABD (aggitated when cued to mobilize) Pain Intervention(s): Limited activity within patient's tolerance;Monitored during session;Repositioned    Home Living Family/patient expects to be discharged to:: Private residence Living Arrangements: Parent Available Help at Discharge: Family Type of Home: House Home Access: Stairs to enter Entrance Stairs-Rails: Lawyer of Steps: 3 Home Layout: One level        Prior Function Level of Independence: Needs assistance   Gait / Transfers Assistance Needed: PTA pt amb ad lib, no device, no falls history; typically tolerates AMB longer distances.  ADL's / Homemaking  Assistance Needed: Need bathing and dressing; urinates with supervision, needs help with BM cleanup.  Comments: Prior hospitalization, pt was able to maintain normal ADL/routien with parents; no difficultty at DC.     Hand Dominance        Extremity/Trunk Assessment   Upper Extremity Assessment Upper Extremity Assessment: Overall WFL for tasks assessed    Lower Extremity Assessment Lower Extremity Assessment: Overall WFL for tasks assessed       Communication      Cognition  Arousal/Alertness: Awake/alert Behavior During Therapy: Agitated;Flat affect Overall Cognitive Status: Within Functional Limits for tasks assessed                                        General Comments      Exercises     Assessment/Plan    PT Assessment Patient needs continued PT services  PT Problem List Decreased activity tolerance;Decreased knowledge of use of DME;Decreased safety awareness;Decreased knowledge of precautions;Decreased balance;Decreased mobility       PT Treatment Interventions Patient/family education;Therapeutic activities;Therapeutic exercise;Functional mobility training    PT Goals (Current goals can be found in the Care Plan section)  Acute Rehab PT Goals Patient Stated Goal: avoid deconditioning while admitted PT Goal Formulation: With family Time For Goal Achievement: 09/13/20 Potential to Achieve Goals: Fair    Frequency Min 2X/week   Barriers to discharge        Co-evaluation               AM-PAC PT "6 Clicks" Mobility  Outcome Measure Help needed turning from your back to your side while in a flat bed without using bedrails?: A Little Help needed moving from lying on your back to sitting on the side of a flat bed without using bedrails?: A Little Help needed moving to and from a bed to a chair (including a wheelchair)?: A Lot Help needed standing up from a chair using your arms (e.g., wheelchair or bedside chair)?: A Lot Help needed to walk in hospital room?: A Lot Help needed climbing 3-5 steps with a railing? : A Lot 6 Click Score: 14    End of Session   Activity Tolerance: Patient limited by pain;Treatment limited secondary to agitation Patient left: in bed;with family/visitor present   PT Visit Diagnosis: Difficulty in walking, not elsewhere classified (R26.2);Other abnormalities of gait and mobility (R26.89)    Time: 1020-1043 PT Time Calculation (min) (ACUTE ONLY): 23 min   Charges:   PT  Evaluation $PT Eval Moderate Complexity: 1 Mod PT Treatments $Self Care/Home Management: 8-22        1:56 PM, 08/30/20 Rosamaria Lints, PT, DPT Physical Therapist - [facility]  [phone] (ASCOM)    Jonathan Kirby C 08/30/2020, 1:45 PM

## 2020-08-30 NOTE — Progress Notes (Signed)
NEUROLOGY CONSULTATION PROGRESS NOTE   Date of service: August 30, 2020 Patient Name: Jonathan Kirby MRN:  [id number] DOB:  14-Jan-1986  Brief HPI   Jonathan Kirby is a 34 y.o. 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 We were consulted for encephalopathy given his history of seizures.  Of note, he did have a brief seizure in the setting of missing a couple days of Felbamate, no further seizures since he has been on Felbamate.   Interval Hx   Mom reports that Jonathan Kirby woke up earlier today and was able to feed himself felbatol in apple sauce by himself. He was very close to his baseline, he went back to sleep afterwards. No further seizure activity has been witnessed by mom or documented in the chart.  Vitals   Vitals:   08/29/20 1958 08/30/20 0319 08/30/20 0810 08/30/20 1207  BP: 114/78 118/80 115/84 117/81  Pulse: 67 65 62 63  Resp: 16 15 17 16   Temp:  97.9 F (36.6 C) 97.9 F (36.6 C) 97.9 F (36.6 C)  TempSrc: Oral  Axillary   SpO2: 97% 98% 99% 100%  Weight:      Height:         Body mass index is 17.79 kg/m.  Physical Exam   General: Laying comfortably in bed; in no acute distress. HENT: Normal oropharynx and mucosa. Normal external appearance of ears and nose. Neck: Supple, no pain or tenderness CV: No JVD. No peripheral edema. Pulmonary: Symmetric Chest rise. Normal respiratory effort. Abdomen: Soft to touch, non-tender. Ext: No cyanosis, edema, or deformity Skin: No rash. Normal palpation of skin.  Musculoskeletal: Normal digits and nails by inspection. No clubbing.  Neurologic Examination   Limited exam due to poor cooperation: Somnolent and slept as mom provided history, opens eyes when his mom  gently touches him. Very briefly looked at me and then closed his eyes. He did vocalize to her touch, he pulled up the bedsheets with both hands over his hands. He is able to purposefully move his legs away with babinskis.   Labs   Basic Metabolic Panel:  Lab Results  Component Value Date   NA 148 (H) 08/30/2020   K 4.6 08/30/2020   CO2 25 08/30/2020   GLUCOSE 75 08/30/2020   BUN 15 08/30/2020   CREATININE 0.82 08/30/2020   CALCIUM 8.4 (L) 08/30/2020   GFRNONAA >60 08/30/2020   GFRAA >60 04/19/2020   HbA1c: No results found for: HGBA1C LDL: No results found for: [facility] Urine Drug Screen: No results found for: LABOPIA, COCAINSCRNUR, LABBENZ, AMPHETMU, THCU, LABBARB  Alcohol Level No results found for: ETH No results found for: PHENYTOIN, ZONISAMIDE, LAMOTRIGINE, LEVETIRACETA No results found for: PHENYTOIN, PHENOBARB, VALPROATE, CBMZ  Imaging and Diagnostic studies   CT Head without contrast: 1. No acute intracranial abnormality. 2. Nonspecific volume loss involving the cerebellum and possibly also the brainstem.  rEEG:  -Spikes, left frontotemporal, right frontotemporal and bilateral frontocentral region. 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  Impression   Jonathan Kirby is a 34 y.o. 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 We were consulted for encephalopathy given his history of seizures. Jonathan Kirby woke up today and fed himself apple sauce, mom reports that he appeared very close to  his baseline and was even asking for his toys. No seizure activity has been witnessed by mom during this admission or documented in the chart. I am not worried about Jonathan Kirby having seizures.  Recommendations  - Seizure precautions - Continue Felbamate 900mg  BID - Delirium precautions - Encephalopathy labs with normal TSH, Ammonia, B12 levels were normal in august, folate is normal. - Neurology inpatient team will signoff. Please let know if you or the family would like September to see Toby again. ______________________________________________________________________   Thank you for the opportunity to take part in the care of this patient. If you have any further questions, please contact the neurology consultation attending.  Signed,  [place] [facility] Pager Number [phone] [phone]

## 2020-08-31 LAB — BASIC METABOLIC PANEL
Anion gap: 7 (ref 5–15)
BUN: 15 mg/dL (ref 6–20)
CO2: 27 mmol/L (ref 22–32)
Calcium: 8 mg/dL — ABNORMAL LOW (ref 8.9–10.3)
Chloride: 108 mmol/L (ref 98–111)
Creatinine, Ser: 0.61 mg/dL (ref 0.61–1.24)
GFR, Estimated: >60 mL/min (ref >60)
Glucose, Bld: 103 mg/dL — ABNORMAL HIGH (ref 70–99)
Potassium: 3.8 mmol/L (ref 3.5–5.1)
Sodium: 142 mmol/L (ref 135–145)

## 2020-08-31 MED ORDER — PANTOPRAZOLE SODIUM 40 MG PO PACK
40.0000 mg | PACK | Freq: Two times a day (BID) | ORAL | Status: DC
  Administered 2020-08-31 – 2020-09-01 (×2): 40 mg via ORAL
  Filled 2020-08-31 (×3): qty 20

## 2020-08-31 MED ORDER — PANTOPRAZOLE SODIUM 40 MG PO PACK
40.0000 mg | PACK | Freq: Two times a day (BID) | ORAL | Status: DC
  Filled 2020-08-31: qty 20

## 2020-08-31 MED ORDER — POTASSIUM CHLORIDE 20 MEQ PO PACK
40.0000 meq | PACK | Freq: Once | ORAL | Status: AC
  Administered 2020-08-31: 13:00:00 40 meq via ORAL
  Filled 2020-08-31: qty 2

## 2020-08-31 MED ORDER — PANTOPRAZOLE SODIUM 40 MG PO TBEC
40.0000 mg | DELAYED_RELEASE_TABLET | Freq: Two times a day (BID) | ORAL | Status: DC
  Filled 2020-08-31: qty 1

## 2020-08-31 NOTE — Progress Notes (Addendum)
Progress Note    Jonathan Kirby  DVV:[id number] DOB: [birth year]  DOA: 08/21/2020 PCP: [facility]      Brief Narrative:    Medical records reviewed and are as summarized below:  Jonathan Kirby is a 34 y.o. male       Assessment/Plan:   Principal Problem:   Abdominal pain Active Problems:   Autism spectrum disorder with accompanying language impairment and intellectual disability, requiring very substantial support   Constipation   Decreased oral intake   Hypokalemia   Hypomagnesemia   Ogilvie syndrome   Acute metabolic encephalopathy   Aspiration pneumonia of both lower lobes due to gastric secretions (HCC)   Severe protein-calorie malnutrition [name]: less than 60% of standard weight) (HCC)   Malnutrition of moderate degree   Nutrition Problem: Moderate Malnutrition Etiology: social / environmental circumstances (autism, bedbound)  Signs/Symptoms: mild fat depletion,severe fat depletion,moderate muscle depletion,severe muscle depletion   Body mass index is 17.79 kg/m.  (Underweight)   Probable aspiration pneumonia, leukocytosis: Leukocytosis has resolved.  Completed 5-day course of Zosyn on 08/30/2020  Colonic ileus with Ogilvie syndrome, chronic constipation: Improved.  Plan to remove rectal tube today.  Advance diet to soft diet transition to regular diet tomorrow.  Outpatient follow-up with general surgeon  Poor oral intake, moderate protein caloric malnutrition, reflux/vomiting, history of erosive esophagitis: s/p EGD on 09/08/2020 which showed erythema in the gastric antrum.  Pathology report showed focal reactive gastritis.  Change IV to oral Protonix.  Lethargy/altered mental status: Improved.  Mental status is close to baseline.   CT scan of the head done on 08/28/2020 did not show any acute abnormality.  No epileptic activity on EEG done on 08/28/2020.  Neurologist has signed off.  Autism with intellectual disability: Continue  supportive care  Persistent hypokalemia: Improved.  Hypernatremia: Resolved   Plan of care was discussed with his parents at the bedside.  Parents were told that patient will likely be discharged home tomorrow if he tolerates regular diet.    Diet Order            Diet regular Room service appropriate? Yes; Fluid consistency: Thin  Diet effective now           DIET SOFT Room service appropriate? Yes; Fluid consistency: Thin  Diet effective now                    Consultants:  Gastroenterologist  Procedures:  EGD on 08/28/2020 which showed erythema in the gastric antrum    Medications:   . enoxaparin (LOVENOX) injection  40 mg Subcutaneous Q24H  . feeding supplement  237 mL Oral TID BM  . felbamate  900 mg Oral Q12H  .  morphine injection  1 mg Intravenous Once  . pantoprazole  40 mg Oral BID  . potassium chloride  40 mEq Oral Once   Continuous Infusions: . sodium chloride       Anti-infectives (From admission, onward)   Start     Dose/Rate Route Frequency Ordered Stop   08/26/20 0815  piperacillin-tazobactam (ZOSYN) IVPB 3.375 g  Status:  Discontinued        3.375 g 12.5 mL/hr over 240 Minutes Intravenous Every 8 hours 08/26/20 0806 08/30/20 1753             Family Communication/Anticipated D/C date and plan/Code Status   DVT prophylaxis: enoxaparin (LOVENOX) injection 40 mg Start: 08/21/20 2200     Code Status: Full Code  Family Communication:  Plan discussed with his parents at the bedside Disposition Plan:    Status is: Inpatient  Remains inpatient appropriate because:Ongoing diagnostic testing needed not appropriate for outpatient work up and IV treatments appropriate due to intensity of illness or inability to take PO   Dispo: The patient is from: Home              Anticipated d/c is to: Home              Anticipated d/c date is: 1 day              Patient currently is not medically stable to d/c.           Subjective:    Interval events noted.  Her parents are at the bedside.  He has been able to tolerate full liquid diet without any vomiting.  He is more alert and close to baseline mental status according to his parents.  Objective:    Vitals:   08/30/20 2031 08/30/20 2343 08/31/20 0549 08/31/20 0819  BP: (!) 133/91 126/84 115/82 122/89  Pulse: 67 72 65 (!) 57  Resp: 16 16 17 15   Temp: 97.8 F (36.6 C) 98.1 F (36.7 C) (!) 97.5 F (36.4 C) 97.8 F (36.6 C)  TempSrc:    Oral  SpO2: 98% 99% 99% 100%  Weight:      Height:       No data found.   Intake/Output Summary (Last 24 hours) at 08/31/2020 1058 Last data filed at 08/31/2020 1025 Gross per 24 hour  Intake 762.1 ml  Output --  Net 762.1 ml   Filed Weights   08/21/20 1742 08/28/20 0723  Weight: 48.5 kg 48.5 kg    Exam:  GEN: NAD.  He is agitated and trying to hit family and staff SKIN: No rash EYES: No pallor or icterus ENT: MMM CV: RRR PULM: CTA B ABD: soft, ND, NT, +BS CNS: Alert but nonverbal.  Non focal EXT: No edema or tenderness         Data Reviewed:   I have personally reviewed following labs and imaging studies:  Labs: Labs show the following:   Basic Metabolic Panel: Recent Labs  Lab 08/25/20 0340 08/25/20 0433 08/26/20 0549 08/27/20 0614 08/28/20 0547 08/29/20 0552 08/29/20 1551 08/30/20 0458 08/31/20 0542  NA  --    < > 134* 138 142 147*  --  148* 142  K  --    < > 3.7 3.4* 2.9* 2.8*   < > 4.6 3.8  CL  --    < > 101 102 104 108  --  111 108  CO2  --    < > 25 26 27 26   --  25 27  GLUCOSE  --    < > 134* 104* 90 92  --  75 103*  BUN  --    < > 8 14 19 17   --  15 15  CREATININE  --    < > 0.83 0.82 0.72 0.77  --  0.82 0.61  CALCIUM  --    < > 8.2* 8.1* 8.0* 8.0*  --  8.4* 8.0*  MG  --    < > 1.6* 2.0 1.8 1.8  --  2.0  --   PHOS 2.8  --   --   --  3.6  --   --  3.6  --    < > = values in this interval not displayed.   GFR Estimated  Creatinine Clearance: 89.3 mL/min (by C-G formula  based on SCr of 0.61 mg/dL). Liver Function Tests: No results for input(s): AST, ALT, ALKPHOS, BILITOT, PROT, ALBUMIN in the last 168 hours. No results for input(s): LIPASE, AMYLASE in the last 168 hours. Recent Labs  Lab 08/29/20 0552  AMMONIA 15   Coagulation profile No results for input(s): INR, PROTIME in the last 168 hours.  CBC: Recent Labs  Lab 08/26/20 0549 08/27/20 0614 08/28/20 0547 08/29/20 0552  WBC 20.7* 17.1* 8.8 5.0  NEUTROABS 16.7* 12.2* 6.6 2.9  HGB 11.7* 11.0* 10.4* 10.4*  HCT 34.1* 31.8* 30.9* 30.4*  MCV 86.8 86.9 87.0 86.6  PLT 268 233 285 321   Cardiac Enzymes: No results for input(s): CKTOTAL, CKMB, CKMBINDEX, TROPONINI in the last 168 hours. BNP (last 3 results) No results for input(s): PROBNP in the last 8760 hours. CBG: No results for input(s): GLUCAP in the last 168 hours. D-Dimer: No results for input(s): DDIMER in the last 72 hours. Hgb A1c: No results for input(s): HGBA1C in the last 72 hours. Lipid Profile: No results for input(s): CHOL, HDL, LDLCALC, TRIG, CHOLHDL, LDLDIRECT in the last 72 hours. Thyroid function studies: Recent Labs    08/29/20 0552  TSH 1.318   Anemia work up: Recent Labs    08/28/20 1305  FOLATE 21.2   Sepsis Labs: Recent Labs  Lab 08/26/20 0549 08/27/20 0614 08/28/20 0547 08/29/20 0552  PROCALCITON 0.94 2.74 1.68  --   WBC 20.7* 17.1* 8.8 5.0    Microbiology Recent Results (from the past 240 hour(s))  Resp Panel by RT-PCR (Flu A&B, Covid) Nasopharyngeal Swab     Status: None   Collection Time: 08/21/20  9:33 PM   Specimen: Nasopharyngeal Swab; Nasopharyngeal(NP) swabs in vial transport medium  Result Value Ref Range Status   SARS Coronavirus 2 by RT PCR NEGATIVE NEGATIVE Final    Comment: (NOTE) SARS-CoV-2 target nucleic acids are NOT DETECTED.  The SARS-CoV-2 RNA is generally detectable in upper respiratory specimens during the acute phase of infection. The lowest concentration of SARS-CoV-2  viral copies this assay can detect is 138 copies/mL. A negative result does not preclude SARS-Cov-2 infection and should not be used as the sole basis for treatment or other patient management decisions. A negative result may occur with  improper specimen collection/handling, submission of specimen other than nasopharyngeal swab, presence of viral mutation(s) within the areas targeted by this assay, and inadequate number of viral copies(<138 copies/mL). A negative result must be combined with clinical observations, patient history, and epidemiological information. The expected result is Negative.  Fact Sheet for Patients:  BloggerCourse.comhttps://www.fda.gov/media/152166/download  Fact Sheet for Healthcare Providers:  SeriousBroker.ithttps://www.fda.gov/media/152162/download  This test is no t yet approved or cleared by the [place] FDA and  has been authorized for detection and/or diagnosis of SARS-CoV-2 by FDA under an Emergency Use Authorization (EUA). This EUA will remain  in effect (meaning this test can be used) for the duration of the COVID-19 declaration under Section 564(b)(1) of the Act, 21 U.S.C.section 360bbb-3(b)(1), unless the authorization is terminated  or revoked sooner.       Influenza A by PCR NEGATIVE NEGATIVE Final   Influenza B by PCR NEGATIVE NEGATIVE Final    Comment: (NOTE) The Xpert Xpress SARS-CoV-2/FLU/RSV plus assay is intended as an aid in the diagnosis of influenza from Nasopharyngeal swab specimens and should not be used as a sole basis for treatment. Nasal washings and aspirates are unacceptable for Xpert Xpress SARS-CoV-2/FLU/RSV testing.  Fact  Sheet for Patients: BloggerCourse.com  Fact Sheet for Healthcare Providers: SeriousBroker.it  This test is not yet approved or cleared by the [place] FDA and has been authorized for detection and/or diagnosis of SARS-CoV-2 by FDA under an Emergency Use Authorization  (EUA). This EUA will remain in effect (meaning this test can be used) for the duration of the COVID-19 declaration under Section 564(b)(1) of the Act, 21 U.S.C. section 360bbb-3(b)(1), unless the authorization is terminated or revoked.  Performed at [facility], [address] [place], [place] [address]   Urine Culture     Status: Abnormal   Collection Time: 08/24/20  7:00 PM   Specimen: Urine, Random  Result Value Ref Range Status   Specimen Description   Final    URINE, RANDOM Performed at [facility], [address] [place], [place] [address]    Special Requests   Final    NONE Performed at [facility], [address] [place], [place] [address]    Culture 70,000 COLONIES/mL ESCHERICHIA COLI (A)  Final   Report Status 08/27/2020 FINAL  Final   Organism ID, Bacteria ESCHERICHIA COLI (A)  Final      Susceptibility   Escherichia coli - MIC*    AMPICILLIN >=32 RESISTANT Resistant     CEFAZOLIN <=4 SENSITIVE Sensitive     CEFEPIME <=0.12 SENSITIVE Sensitive     CEFTRIAXONE <=0.25 SENSITIVE Sensitive     CIPROFLOXACIN <=0.25 SENSITIVE Sensitive     GENTAMICIN <=1 SENSITIVE Sensitive     IMIPENEM <=0.25 SENSITIVE Sensitive     NITROFURANTOIN <=16 SENSITIVE Sensitive     TRIMETH/SULFA >=320 RESISTANT Resistant     AMPICILLIN/SULBACTAM 16 INTERMEDIATE Intermediate     PIP/TAZO <=4 SENSITIVE Sensitive     * 70,000 COLONIES/mL ESCHERICHIA COLI    Procedures and diagnostic studies:  DG Abd 1 View  Result Date: 08/29/2020 CLINICAL DATA:  Poor p.o. intake.  History of constipation EXAM: ABDOMEN - 1 VIEW COMPARISON:  08/26/2020 FINDINGS: Gaseous distention of large in small bowel diffusely. Small amount of stool in the colon. No free air. Markedly dilated sigmoid colon as noted on prior CT 08/21/2020 Normal skeletal structures IMPRESSION: Moderate distention of large and small bowel loops diffusely extending to the sigmoid colon. Findings compatible  with ileus Electronically Signed   By: Marlan Palau M.D.   On: 08/29/2020 11:36               LOS: 9 days   Maanasa Aderhold  [facility]   Pager on [email or web address]. If 7PM-7AM, please contact night-coverage at [email or web address]     08/31/2020, 10:58 AM

## 2020-09-01 LAB — BASIC METABOLIC PANEL
Anion gap: 7 (ref 5–15)
BUN: 13 mg/dL (ref 6–20)
CO2: 28 mmol/L (ref 22–32)
Calcium: 8 mg/dL — ABNORMAL LOW (ref 8.9–10.3)
Chloride: 105 mmol/L (ref 98–111)
Creatinine, Ser: 0.64 mg/dL (ref 0.61–1.24)
GFR, Estimated: >60 mL/min (ref >60)
Glucose, Bld: 96 mg/dL (ref 70–99)
Potassium: 3.3 mmol/L — ABNORMAL LOW (ref 3.5–5.1)
Sodium: 140 mmol/L (ref 135–145)

## 2020-09-01 LAB — POTASSIUM: Potassium: 4.8 mmol/L (ref 3.5–5.1)

## 2020-09-01 MED ORDER — POTASSIUM CHLORIDE 10 MEQ/100ML IV SOLN
10.0000 meq | INTRAVENOUS | Status: AC
  Administered 2020-09-01 (×4): 10 meq via INTRAVENOUS
  Filled 2020-09-01 (×3): qty 100

## 2020-09-01 MED ORDER — POTASSIUM CHLORIDE CRYS ER 20 MEQ PO TBCR
40.0000 meq | EXTENDED_RELEASE_TABLET | ORAL | Status: DC
  Filled 2020-09-01: qty 2

## 2020-09-01 MED ORDER — HALOPERIDOL 2 MG PO TABS: 2.0000 mg | ORAL_TABLET | Freq: Every day | ORAL | Status: DC | PRN

## 2020-09-01 MED ORDER — POTASSIUM CHLORIDE 20 MEQ PO PACK: 40.0000 meq | PACK | ORAL | Status: DC

## 2020-09-01 NOTE — TOC Initial Note (Signed)
Transition of Care [facility]) - Initial/Assessment Note    Patient Details  Name: Jonathan Kirby MRN: [id number] Date of Birth: [birth year]  [facility] CM/SW Contact:    Trenton Founds, RN Phone Number: 09/01/2020, 4:03 PM  Clinical Narrative:   RNCM spoke with mother by telephone for assessment. Patient is from home with his parents who provide all of his care. Mother reports that patient does not currently nor has he had any type of home health PT. She is agreeable to PT services if agency can be arranged. She reports that patient does not currently use any equipment and she does not really know that he will need any. Mother is agreeable to this CM attempting to set up home health PT but understand that it might not be possible due to his insurance. She reports that they will consider equipment later.  RNCM reached out to [place] with [facility] to see if he can provide services.                 Expected Discharge Plan: Home w Home Health Services Barriers to Discharge: No Barriers Identified   Patient Goals and CMS Choice        Expected Discharge Plan and Services Expected Discharge Plan: Home w Home Health Services       Living arrangements for the past 2 months: Single Family Home                                      Prior Living Arrangements/Services Living arrangements for the past 2 months: Single Family Home Lives with:: Parents   Do you feel safe going back to the place where you live?: Yes               Activities of Daily Living Home Assistive Devices/Equipment: None ADL Screening (condition at time of admission) Patient's cognitive ability adequate to safely complete daily activities?: Yes Is the patient deaf or have difficulty hearing?: No Does the patient have difficulty seeing, even when wearing glasses/contacts?: No Does the patient have difficulty concentrating, remembering, or making decisions?: Yes Patient able to express need for  assistance with ADLs?: Yes Does the patient have difficulty dressing or bathing?: No Independently performs ADLs?: Yes (appropriate for developmental age) Does the patient have difficulty walking or climbing stairs?: No Weakness of Legs: None Weakness of Arms/Hands: None  Permission Sought/Granted                  Emotional Assessment              Admission diagnosis:  Ileus (HCC) [K56.7] Abdominal pain [R10.9] Constipation, unspecified constipation type [K59.00] 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 Malnutrition of moderate degree 08/28/2020  . Aspiration pneumonia of both lower lobes due to gastric secretions (HCC) 08/26/2020  . Severe protein-calorie malnutrition [name]: less than 60% of standard weight) (HCC) 08/26/2020  . Acute metabolic encephalopathy 08/24/2020  . 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 Abdominal pain 08/21/2020  . Hypokalemia 08/21/2020  . Hypomagnesemia 08/21/2020  . Acute esophagitis   . Seizure disorder (HCC)   . Constipation 04/14/2020  . Decreased oral intake 04/14/2020  . Dysphagia 04/14/2020  . 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 Autism spectrum disorder with accompanying language impairment and intellectual disability, requiring very substantial support 07/23/2014  . 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 Partial epilepsy with impairment of consciousness (HCC) 04/02/2013  . Moderate intellectual disabilities 04/02/2013  . Insomnia, unspecified 04/02/2013  . Intermittent explosive disorder 04/02/2013  . Mental and behavioral problem [birth year]   PCP:  [facility], [facility] Pharmacy:   [facility] [phone] [place], [place] - [id number] [place] [address] Phone: [phone] Fax: [phone]  [facility] [phone] [place], [place] - [address] [place] [address] Phone: [phone] Fax:  [phone]  [facility] - [place], [place] - [address] [place] [address] Phone: [phone] Fax: [phone]     Social Determinants of Health (SDOH) Interventions    Readmission Risk Interventions No flowsheet data found.

## 2020-09-01 NOTE — Plan of Care (Signed)

## 2020-09-01 NOTE — Plan of Care (Signed)
  Problem: Education: Goal: Knowledge of General Education information will improve Description: Including pain rating scale, medication(s)/side effects and non-pharmacologic comfort measures 09/01/2020 1659 by Orvan Seen, RN Outcome: Completed/Met 09/01/2020 0757 by Orvan Seen, RN Outcome: Progressing   Problem: Health Behavior/Discharge Planning: Goal: Ability to manage health-related needs will improve 09/01/2020 1659 by Orvan Seen, RN Outcome: Completed/Met 09/01/2020 0757 by Orvan Seen, RN Outcome: Progressing   Problem: Clinical Measurements: Goal: Ability to maintain clinical measurements within normal limits will improve 09/01/2020 1659 by Orvan Seen, RN Outcome: Completed/Met 09/01/2020 0757 by Orvan Seen, RN Outcome: Progressing Goal: Will remain free from infection 09/01/2020 1659 by Orvan Seen, RN Outcome: Completed/Met 09/01/2020 0757 by Orvan Seen, RN Outcome: Progressing Goal: Diagnostic test results will improve 09/01/2020 1659 by Orvan Seen, RN Outcome: Completed/Met 09/01/2020 0757 by Orvan Seen, RN Outcome: Progressing Goal: Respiratory complications will improve 09/01/2020 1659 by Orvan Seen, RN Outcome: Completed/Met 09/01/2020 0757 by Orvan Seen, RN Outcome: Progressing Goal: Cardiovascular complication will be avoided 09/01/2020 1659 by Orvan Seen, RN Outcome: Completed/Met 09/01/2020 0757 by Orvan Seen, RN Outcome: Progressing   Problem: Activity: Goal: Risk for activity intolerance will decrease 09/01/2020 1659 by Orvan Seen, RN Outcome: Completed/Met 09/01/2020 0757 by Orvan Seen, RN Outcome: Progressing   Problem: Nutrition: Goal: Adequate nutrition will be maintained 09/01/2020 1659 by Orvan Seen, RN Outcome: Completed/Met 09/01/2020 0757 by Orvan Seen, RN Outcome: Progressing   Problem: Coping: Goal: Level of anxiety will  decrease 09/01/2020 1659 by Orvan Seen, RN Outcome: Completed/Met 09/01/2020 0757 by Orvan Seen, RN Outcome: Progressing   Problem: Elimination: Goal: Will not experience complications related to bowel motility 09/01/2020 1659 by Orvan Seen, RN Outcome: Completed/Met 09/01/2020 0757 by Orvan Seen, RN Outcome: Progressing Goal: Will not experience complications related to urinary retention 09/01/2020 1659 by Orvan Seen, RN Outcome: Completed/Met 09/01/2020 0757 by Orvan Seen, RN Outcome: Progressing   Problem: Pain Managment: Goal: General experience of comfort will improve 09/01/2020 1659 by Orvan Seen, RN Outcome: Completed/Met 09/01/2020 0757 by Orvan Seen, RN Outcome: Progressing   Problem: Safety: Goal: Ability to remain free from injury will improve 09/01/2020 1659 by Orvan Seen, RN Outcome: Completed/Met 09/01/2020 0757 by Orvan Seen, RN Outcome: Progressing   Problem: Skin Integrity: Goal: Risk for impaired skin integrity will decrease 09/01/2020 1659 by Orvan Seen, RN Outcome: Completed/Met 09/01/2020 0757 by Orvan Seen, RN Outcome: Progressing

## 2020-09-01 NOTE — Discharge Summary (Signed)
Physician Discharge Summary  Jonathan Kirby ASN:[id number] DOB: 1985-09-24 DOA: 08/21/2020  PCP: Almetta Lovely, [facility]  Admit date: 08/21/2020 Discharge date: 09/01/2020  Discharge disposition: Home   Recommendations for Outpatient Follow-Up:   Follow-up with PCP in 1 week Follow-up with general surgeon to discuss options for recurrent Ogilvie syndrome.   Discharge Diagnosis:   Principal Problem:   Abdominal pain Active Problems:   Autism spectrum disorder with accompanying language impairment and intellectual disability, requiring very substantial support   Constipation   Decreased oral intake   Hypokalemia   Hypomagnesemia   Ogilvie syndrome   Acute metabolic encephalopathy   Aspiration pneumonia of both lower lobes due to gastric secretions (HCC)   Severe protein-calorie malnutrition Jonathan Kirby: less than 60% of standard weight) (HCC)   Malnutrition of moderate degree    Discharge Condition: Stable.  Diet recommendation:  Diet Order            Diet regular Room service appropriate? Yes; Fluid consistency: Thin  Diet effective now           Diet general                   Code Status: Full Code     Hospital Course:   Mr. Jonathan Kirby is a 34 year old man with medical history significant for epilepsy, erosive esophagitis, autism and intellectual disability (nonverbal at baseline), who was brought to the hospital because of increasing abdominal distention, constipation, poor oral intake and decreased urine output.  He also had reflux/vomiting.  He was admitted to the hospital for colonic ileus and Ogilvie syndrome.  He was treated with laxatives, IV fluids, IV Protonix and bowel decompression with rectal tube.  He was evaluated by the gastroenterologist and he underwent EGD on 08/28/2020 which showed erythema in the gastric antrum.  Biopsy report was consistent with focal reactive gastritis.  He also had findings concerning for aspiration pneumonia.  He was  treated with empiric IV antibiotics.  Hospital course was complicated by lethargy that was attributed to acute metabolic encephalopathy.  He had electrolyte abnormalities including hypokalemia, hypomagnesemia and hypernatremia that were treated as well.  His condition has improved.  His mental status is back to baseline.  He has been able to tolerate a regular diet without any recurrent vomiting or abdominal distention.  He is deemed stable for discharge to home.  Discharge plan was discussed with his parents at the bedside.  Medical Consultants:    Gastroenterologist, Dr. Allegra Lai and Dr. Tobi Bastos  Neurologist, Dr. Joyce Gross surgeon, Dr. Everlene Farrier   Discharge Exam:    Vitals:   08/31/20 2044 09/01/20 0845 09/01/20 1214 09/01/20 1539  BP: (!) 131/95 (!) 123/96 117/89 123/88  Pulse: 68 78 79 76  Resp: 17 16 16 20   Temp:  97.8 F (36.6 C) 98.1 F (36.7 C) 98.2 F (36.8 C)  TempSrc: Oral Oral Oral Oral  SpO2: 100% 100% 99% 100%  Weight:      Height:         GEN: NAD SKIN: No rash EYES: EOMI ENT: MMM CV: RRR PULM: CTA B ABD: soft, ND, NT, +BS CNS: Alert but non verbal, non focal EXT: No edema or tenderness   The results of significant diagnostics from this hospitalization (including imaging, microbiology, ancillary and laboratory) are listed below for reference.     Procedures and Diagnostic Studies:   CT ABDOMEN PELVIS W CONTRAST  Result Date: 08/21/2020 CLINICAL DATA:  Abdominal distension and constipation, initial encounter  EXAM: CT ABDOMEN AND PELVIS WITH CONTRAST TECHNIQUE: Multidetector CT imaging of the abdomen and pelvis was performed using the standard protocol following bolus administration of intravenous contrast. CONTRAST:  61mL OMNIPAQUE IOHEXOL 300 MG/ML  SOLN COMPARISON:  04/18/2020 FINDINGS: Lower chest: No acute abnormality. Hepatobiliary: No focal liver abnormality is seen. No gallstones, gallbladder wall thickening, or biliary dilatation. Pancreas:  Unremarkable. No pancreatic ductal dilatation or surrounding inflammatory changes. Spleen: Normal in size without focal abnormality. Adrenals/Urinary Tract: Adrenal glands are within normal limits. The kidneys demonstrate a normal enhancement pattern bilaterally. No obstructive changes are seen. The bladder is partially distended. Stomach/Bowel: Significant gaseous distension of the colon consistent with a colonic ileus. No definitive obstructive lesion is seen. Air distended small bowel is noted as well also likely related to the underlying ileus. Fluid and fecal material is noted within the rectum although no definitive impaction is seen. Stomach is decompressed. Vascular/Lymphatic: No significant vascular findings are present. No enlarged abdominal or pelvic lymph nodes. Reproductive: Prostate is unremarkable. Other: No abdominal wall hernia or abnormality. No abdominopelvic ascites. Musculoskeletal: No acute or significant osseous findings. IMPRESSION: Diffuse distension of the colon with gas consistent with a generalized colonic ileus. No definitive obstructing lesion is seen. Fluid and fecal material is noted within the rectum although no definitive impaction is noted. No other focal abnormality is seen. Electronically Signed   By: Alcide Clever M.D.   On: 08/21/2020 20:37   DG Abd Portable 2 Views  Result Date: 08/21/2020 CLINICAL DATA:  34 year old male with constipation and abdominal distension. EXAM: PORTABLE ABDOMEN - 2 VIEW COMPARISON:  Abdominal radiograph dated 04/17/2020. CT dated 04/18/2020. FINDINGS: There is diffuse gaseous distension of redundant colon. Large amount of fecal matter noted in the proximal colon. No definite small bowel obstruction. Evaluation however is limited due to diffuse colonic distension. There is superior distension of the sigmoid to the left upper abdomen. Sigmoid volvulus is not excluded. CT may provide better evaluation if clinically indicated. No obvious free air. No  radiopaque calculi. The osseous structures are intact. IMPRESSION: Diffuse gaseous distension of the colon, possibly chronic and representing adynamic ileus. Sigmoid volvulus is not excluded. CT may provide better evaluation if clinically indicated. Electronically Signed   By: Elgie Collard M.D.   On: 08/21/2020 19:22     Labs:   Basic Metabolic Panel: Recent Labs  Lab 08/26/20 0549 08/27/20 0614 08/28/20 0547 08/29/20 0552 08/29/20 1551 08/30/20 0458 08/31/20 0542 09/01/20 0534  NA 134* 138 142 147*  --  148* 142 140  K 3.7 3.4* 2.9* 2.8*   < > 4.6 3.8 3.3*  CL 101 102 104 108  --  111 108 105  CO2 25 26 27 26   --  25 27 28   GLUCOSE 134* 104* 90 92  --  75 103* 96  BUN 8 14 19 17   --  15 15 13   CREATININE 0.83 0.82 0.72 0.77  --  0.82 0.61 0.64  CALCIUM 8.2* 8.1* 8.0* 8.0*  --  8.4* 8.0* 8.0*  MG 1.6* 2.0 1.8 1.8  --  2.0  --   --   PHOS  --   --  3.6  --   --  3.6  --   --    < > = values in this interval not displayed.   GFR Estimated Creatinine Clearance: 89.3 mL/min (by C-G formula based on SCr of 0.64 mg/dL). Liver Function Tests: No results for input(s): AST, ALT, ALKPHOS, BILITOT, PROT, ALBUMIN in  the last 168 hours. No results for input(s): LIPASE, AMYLASE in the last 168 hours. Recent Labs  Lab 08/29/20 0552  AMMONIA 15   Coagulation profile No results for input(s): INR, PROTIME in the last 168 hours.  CBC: Recent Labs  Lab 08/26/20 0549 08/27/20 0614 08/28/20 0547 08/29/20 0552  WBC 20.7* 17.1* 8.8 5.0  NEUTROABS 16.7* 12.2* 6.6 2.9  HGB 11.7* 11.0* 10.4* 10.4*  HCT 34.1* 31.8* 30.9* 30.4*  MCV 86.8 86.9 87.0 86.6  PLT 268 233 285 321   Cardiac Enzymes: No results for input(s): CKTOTAL, CKMB, CKMBINDEX, TROPONINI in the last 168 hours. BNP: Invalid input(s): POCBNP CBG: No results for input(s): GLUCAP in the last 168 hours. D-Dimer No results for input(s): DDIMER in the last 72 hours. Hgb A1c No results for input(s): HGBA1C in the last 72  hours. Lipid Profile No results for input(s): CHOL, HDL, LDLCALC, TRIG, CHOLHDL, LDLDIRECT in the last 72 hours. Thyroid function studies No results for input(s): TSH, T4TOTAL, T3FREE, THYROIDAB in the last 72 hours.  Invalid input(s): FREET3 Anemia work up No results for input(s): VITAMINB12, FOLATE, FERRITIN, TIBC, IRON, RETICCTPCT in the last 72 hours. Microbiology Recent Results (from the past 240 hour(s))  Urine Culture     Status: Abnormal   Collection Time: 08/24/20  7:00 PM   Specimen: Urine, Random  Result Value Ref Range Status   Specimen Description   Final    URINE, RANDOM Performed at [facility], [address] [place], [place] [address]    Special Requests   Final    NONE Performed at [facility], [address] [place], [place] [address]    Culture 70,000 COLONIES/mL ESCHERICHIA COLI (A)  Final   Report Status 08/27/2020 FINAL  Final   Organism ID, Bacteria ESCHERICHIA COLI (A)  Final      Susceptibility   Escherichia coli - MIC*    AMPICILLIN >=32 RESISTANT Resistant     CEFAZOLIN <=4 SENSITIVE Sensitive     CEFEPIME <=0.12 SENSITIVE Sensitive     CEFTRIAXONE <=0.25 SENSITIVE Sensitive     CIPROFLOXACIN <=0.25 SENSITIVE Sensitive     GENTAMICIN <=1 SENSITIVE Sensitive     IMIPENEM <=0.25 SENSITIVE Sensitive     NITROFURANTOIN <=16 SENSITIVE Sensitive     TRIMETH/SULFA >=320 RESISTANT Resistant     AMPICILLIN/SULBACTAM 16 INTERMEDIATE Intermediate     PIP/TAZO <=4 SENSITIVE Sensitive     * 70,000 COLONIES/mL ESCHERICHIA COLI     Discharge Instructions:   Discharge Instructions    Diet general   Complete by: As directed    Increase activity slowly   Complete by: As directed      Allergies as of 09/01/2020   No Known Allergies     Medication List    STOP taking these medications   linaclotide 290 MCG Caps capsule Commonly known as: Linzess     TAKE these medications   felbamate 600 MG/5ML suspension Commonly known  as: FELBATOL Take 7.5 mL by mouth twice daily   haloperidol 2 MG tablet Commonly known as: HALDOL Take 1 tablet (2 mg total) by mouth daily as needed for agitation. Crush 1 tablet in place in a puree What changed:   how much to take  how to take this  when to take this  reasons to take this   pantoprazole 40 MG tablet Commonly known as: PROTONIX Take 1 tablet (40 mg total) by mouth 2 (two) times daily.   Sennosides 25 MG Tabs Take  1 tablet (25 mg total) by mouth in the morning and at bedtime.   sodium phosphate 7-19 GM/118ML Enem Place 1 enema rectally every three (3) days as needed. If no bowel movement   Trulance 3 MG Tabs Generic drug: Plecanatide Take 3 mg by mouth daily.       Follow-up Information    Pabon, [place] F, MD. Schedule an appointment as soon as possible for a visit in 1 week(s).   Specialty: General Surgery Contact information: [address] [place] [address] [phone]                Time coordinating discharge: 36 minutes  Signed:  Lurene Shadow  [facility] 09/01/2020, 4:27 PM   Pager on [email or web address]. If 7PM-7AM, please contact night-coverage at [email or web address]

## 2020-09-02 LAB — VITAMIN B1: Vitamin B1 (Thiamine): 91 nmol/L (ref 66.5–200.0)

## 2020-09-03 ENCOUNTER — Other Ambulatory Visit: Payer: Self-pay

## 2020-09-03 ENCOUNTER — Encounter: Payer: Self-pay | Admitting: Surgery

## 2020-09-03 ENCOUNTER — Telehealth (INDEPENDENT_AMBULATORY_CARE_PROVIDER_SITE_OTHER): Payer: Medicaid Other | Admitting: Surgery

## 2020-09-03 DIAGNOSIS — K5981 Ogilvie syndrome: Secondary | ICD-10-CM | POA: Diagnosis not present

## 2020-09-03 NOTE — Progress Notes (Signed)
Referring provider:  Dr Tobi Bastos Dr Rudi Heap, [facility] [address] [place],  [place] [address]  Virtual Visit via Telemedicine Note  I connected with Jonathan Kirby and Jonathan Kirby ( Breon's Mother and Father) by My Chart Video Visit at their home/work on 09/03/20 at 11:45 AM EST and verified that I was speaking with the correct person using their name and two idenfiers/date of birth.  Is not that the patient has significant developmental disorder and severe autism and is unable to participate in the visit.  The mother and father are having a conversation with me . I discussed the limitations, risks, security and privacy concerns of performing an evaluation and management service by /video telemedicine and the availability of in person appointments. I also discussed with the patient's family that there may be a patient responsible charge related to this service. The patient's family expressed understanding and agreed to proceed.  History of Present Illness: 34 y.o. Male is being evaluated for Ogilvie's syndrome.  He recently was hospitalized For large bowel pseudoobstruction He needed rectal tube and multiple motility agents.  He finally was discharged.  At the personally reviewed the images from an EGD showing a small hiatal hernia.  Have also personally reviewed the CT scan showing evidence of colonic ileus with mild dilation of the small bowel.  There is no evidence of mechanical obstruction.  The patient recover from the colonic ileus and now is at home.  According to the mother he seems to be doing better.  He still has some chronic abdominal distention and now is able to eat.  He is back to baseline. A conversation was mainly centered about surgical options for chronic colonic ileus.  Discussed with patient in detail about the role of subtotal colectomy with an end ileostomy, the role for potential cecostomy.  Given the patient's cognitive issues and severe dizziness there will be  certain challenges with a stoma.  They understand that there is significant risk associated with a major operation.  Also discussed with them that there is no guarantees that this will resolve his motility disorder as we will only be removing the home and will be leaving the small bowel.  Small bowel constantly have similar issues regarding motility problems. Conversation lasted about 20 minutes of face-to-face time.  I spent at least 45 minutes in this encounter including extensive counseling, reviewing medical records, reviewing images and also discussing the case with referring provider. After extensive discussion with the family about surgical options I think that they want to give a trial of another medication and we will have another virtual appointment in about 3 to 4 weeks.  They understand the limitations of virtual appointments but on the other hand they prefer this as the son is severely ill to stick an outpatient medical visit will be even more challenging  Review of Systems:  Unable to be obtained due to autism    Assessment:  34 y.o. yo Male with a problem list including...  Patient Active Problem List   Diagnosis Date Noted  . Malnutrition of moderate degree 08/28/2020  . Aspiration pneumonia of both lower lobes due to gastric secretions (HCC) 08/26/2020  . Severe protein-calorie malnutrition [name]: less than 60% of standard weight) (HCC) 08/26/2020  . Acute metabolic encephalopathy 08/24/2020  . 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 Abdominal pain 08/21/2020  . Hypokalemia 08/21/2020  . Hypomagnesemia 08/21/2020  . Acute esophagitis   . Seizure disorder (HCC)   . Constipation 04/14/2020  .  Decreased oral intake 04/14/2020  . Dysphagia 04/14/2020  . 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 Autism spectrum disorder with accompanying language impairment and intellectual disability, requiring very substantial support 07/23/2014  . Generalized convulsive  epilepsy (HCC) 04/02/2013  . Partial epilepsy with impairment of consciousness (HCC) 04/02/2013  . Moderate intellectual disabilities 04/02/2013  . Insomnia, unspecified 04/02/2013  . Intermittent explosive disorder 04/02/2013  . 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  Difficult situation.  Options of potential surgical intervention discussed with the family in detail.  I do think that there might be some more wound from medical therapy before surgical therapy is entertained We will be happy to see him in 3 to 4 weeks with other virtual appointment All of the above recommendations were discussed with the patient, and all of patient's questions were answered to their expressed satisfaction.  The patient's family was advised to call back or seek an in-person evaluation if the symptoms worsen or if the condition fails to improve as anticipated.  From [facility] outpatient surgery office, I provided 45 minutes of non-face-to-face time during this encounter.  -- Sterling Big, MD, FACS [place]: [facility] General Surgery - Partnering for exceptional care. Office: [phone]

## 2020-09-05 ENCOUNTER — Telehealth: Payer: Medicaid Other | Admitting: Emergency Medicine

## 2020-09-05 DIAGNOSIS — R300 Dysuria: Secondary | ICD-10-CM

## 2020-09-05 NOTE — Progress Notes (Signed)
Based on what you shared with me, I feel your condition warrants further evaluation and I recommend that you be seen for a face to face office visit.  Male bladder infections are not very common.  We worry about prostate or kidney conditions.  The standard of care is to examine the abdomen and kidneys, and to do a urine and blood test to make sure that something more serious is not going on.  We recommend that you see a provider today.  If your doctor's office is closed [facility] has the following Urgent Cares:    NOTE: If you entered your credit card information for this eVisit, you will not be charged. You may see a "hold" on your card for the $35 but that hold will drop off and you will not have a charge processed.   If you are having a true medical emergency please call 911.     For an urgent face to face visit, [facility] has four urgent care centers for your convenience:    NEW:  [facility] [phone] [address] [place], [place] [address] .  Monday - Friday 10 am - 6 pm    . [facility]    [phone]                  Get Driving Directions  [address] [place], [place] [address] . 10 am to 8 pm Monday-Friday . 12 pm to 8 pm Saturday-Sunday   . [facility]  [phone]                  Get Driving Directions  [address] [place], [place] [address] . 8 am to 8 pm Monday-Friday . 9 am to 6 pm Saturday . 11 am to 6 pm Sunday   . [facility] [place]  [phone]                  Get Driving Directions   [address] [place], [place] [address] . 8 am to 8 pm Monday-Friday . 8 am to 4 pm Saturday-Sunday    .  [facility]gent [facility] at [facility]                    Get Driving Directions  [phone]  [address] [place], [place] [address]  . Monday-Friday, 12 PM to 6 PM    Your e-visit  answers were reviewed by a board certified advanced clinical practitioner to complete your personal care plan.  Thank you for using e-Visits.  Approximately 5 minutes was used in reviewing the patient's chart, questionnaire, prescribing medications, and documentation.  

## 2020-09-13 DIAGNOSIS — K562 Volvulus: Principal | ICD-10-CM

## 2020-09-13 DIAGNOSIS — G40409 Other generalized epilepsy and epileptic syndromes, not intractable, without status epilepticus: Principal | ICD-10-CM

## 2020-09-13 DIAGNOSIS — R109 Unspecified abdominal pain: Principal | ICD-10-CM

## 2020-09-13 DIAGNOSIS — K5939 Other megacolon: Principal | ICD-10-CM

## 2020-09-13 DIAGNOSIS — K5981 Ogilvie syndrome: Principal | ICD-10-CM

## 2020-09-13 DIAGNOSIS — K5909 Other constipation: Principal | ICD-10-CM

## 2020-09-13 DIAGNOSIS — K9189 Other postprocedural complications and disorders of digestive system: Principal | ICD-10-CM

## 2020-09-13 DIAGNOSIS — Z20822 Contact with and (suspected) exposure to covid-19: Principal | ICD-10-CM

## 2020-09-13 DIAGNOSIS — K2100 Gastro-esophageal reflux disease with esophagitis, without bleeding: Principal | ICD-10-CM

## 2020-09-13 DIAGNOSIS — K567 Ileus, unspecified: Principal | ICD-10-CM

## 2020-09-13 DIAGNOSIS — E876 Hypokalemia: Principal | ICD-10-CM

## 2020-09-13 DIAGNOSIS — E162 Hypoglycemia, unspecified: Principal | ICD-10-CM

## 2020-09-13 DIAGNOSIS — F6381 Intermittent explosive disorder: Principal | ICD-10-CM

## 2020-09-13 DIAGNOSIS — G479 Sleep disorder, unspecified: Principal | ICD-10-CM

## 2020-09-13 DIAGNOSIS — K5989 Other specified functional intestinal disorders: Principal | ICD-10-CM

## 2020-09-13 DIAGNOSIS — K5900 Constipation, unspecified: Principal | ICD-10-CM

## 2020-09-13 DIAGNOSIS — K56609 Unspecified intestinal obstruction, unspecified as to partial versus complete obstruction: Principal | ICD-10-CM

## 2020-09-13 DIAGNOSIS — F840 Autistic disorder: Principal | ICD-10-CM

## 2020-09-13 DIAGNOSIS — J982 Interstitial emphysema: Principal | ICD-10-CM

## 2020-09-14 ENCOUNTER — Ambulatory Visit
Admit: 2020-09-14 | Discharge: 2020-09-29 | Disposition: A | Discharge status: Home Health | Payer: MEDICARE | Source: Other Acute Inpatient Hospital

## 2020-09-14 ENCOUNTER — Encounter
Admit: 2020-09-14 | Discharge: 2020-09-29 | Disposition: A | Discharge status: Home Health | Payer: MEDICARE | Source: Other Acute Inpatient Hospital

## 2020-09-14 DIAGNOSIS — K567 Ileus, unspecified: Principal | ICD-10-CM

## 2020-09-14 DIAGNOSIS — R109 Unspecified abdominal pain: Principal | ICD-10-CM

## 2020-09-14 DIAGNOSIS — K5909 Other constipation: Principal | ICD-10-CM

## 2020-09-14 DIAGNOSIS — F6381 Intermittent explosive disorder: Principal | ICD-10-CM

## 2020-09-14 DIAGNOSIS — E876 Hypokalemia: Principal | ICD-10-CM

## 2020-09-14 DIAGNOSIS — Z20822 Contact with and (suspected) exposure to covid-19: Principal | ICD-10-CM

## 2020-09-14 DIAGNOSIS — K5989 Other specified functional intestinal disorders: Principal | ICD-10-CM

## 2020-09-14 DIAGNOSIS — G479 Sleep disorder, unspecified: Principal | ICD-10-CM

## 2020-09-14 DIAGNOSIS — G40409 Other generalized epilepsy and epileptic syndromes, not intractable, without status epilepticus: Principal | ICD-10-CM

## 2020-09-14 DIAGNOSIS — K9189 Other postprocedural complications and disorders of digestive system: Principal | ICD-10-CM

## 2020-09-14 DIAGNOSIS — J982 Interstitial emphysema: Principal | ICD-10-CM

## 2020-09-14 DIAGNOSIS — F840 Autistic disorder: Principal | ICD-10-CM

## 2020-09-14 DIAGNOSIS — E162 Hypoglycemia, unspecified: Principal | ICD-10-CM

## 2020-09-14 DIAGNOSIS — K5981 Ogilvie syndrome: Principal | ICD-10-CM

## 2020-09-14 DIAGNOSIS — K2100 Gastro-esophageal reflux disease with esophagitis, without bleeding: Principal | ICD-10-CM

## 2020-09-14 DIAGNOSIS — K562 Volvulus: Principal | ICD-10-CM

## 2020-09-14 DIAGNOSIS — K5939 Other megacolon: Principal | ICD-10-CM

## 2020-09-15 DIAGNOSIS — F6381 Intermittent explosive disorder: Principal | ICD-10-CM

## 2020-09-15 DIAGNOSIS — K567 Ileus, unspecified: Principal | ICD-10-CM

## 2020-09-15 DIAGNOSIS — F840 Autistic disorder: Principal | ICD-10-CM

## 2020-09-15 DIAGNOSIS — K9189 Other postprocedural complications and disorders of digestive system: Principal | ICD-10-CM

## 2020-09-15 DIAGNOSIS — K5989 Other specified functional intestinal disorders: Principal | ICD-10-CM

## 2020-09-15 DIAGNOSIS — G40409 Other generalized epilepsy and epileptic syndromes, not intractable, without status epilepticus: Principal | ICD-10-CM

## 2020-09-15 DIAGNOSIS — R109 Unspecified abdominal pain: Principal | ICD-10-CM

## 2020-09-15 DIAGNOSIS — J982 Interstitial emphysema: Principal | ICD-10-CM

## 2020-09-15 DIAGNOSIS — E876 Hypokalemia: Principal | ICD-10-CM

## 2020-09-15 DIAGNOSIS — K5909 Other constipation: Principal | ICD-10-CM

## 2020-09-15 DIAGNOSIS — K562 Volvulus: Principal | ICD-10-CM

## 2020-09-15 DIAGNOSIS — E162 Hypoglycemia, unspecified: Principal | ICD-10-CM

## 2020-09-15 DIAGNOSIS — K5939 Other megacolon: Principal | ICD-10-CM

## 2020-09-15 DIAGNOSIS — K2100 Gastro-esophageal reflux disease with esophagitis, without bleeding: Principal | ICD-10-CM

## 2020-09-15 DIAGNOSIS — G479 Sleep disorder, unspecified: Principal | ICD-10-CM

## 2020-09-15 DIAGNOSIS — K5981 Ogilvie syndrome: Principal | ICD-10-CM

## 2020-09-15 DIAGNOSIS — Z20822 Contact with and (suspected) exposure to covid-19: Principal | ICD-10-CM

## 2020-09-16 DIAGNOSIS — K5989 Other specified functional intestinal disorders: Principal | ICD-10-CM

## 2020-09-16 DIAGNOSIS — K5909 Other constipation: Principal | ICD-10-CM

## 2020-09-16 DIAGNOSIS — G40409 Other generalized epilepsy and epileptic syndromes, not intractable, without status epilepticus: Principal | ICD-10-CM

## 2020-09-16 DIAGNOSIS — J982 Interstitial emphysema: Principal | ICD-10-CM

## 2020-09-16 DIAGNOSIS — K5939 Other megacolon: Principal | ICD-10-CM

## 2020-09-16 DIAGNOSIS — K562 Volvulus: Principal | ICD-10-CM

## 2020-09-16 DIAGNOSIS — F840 Autistic disorder: Principal | ICD-10-CM

## 2020-09-16 DIAGNOSIS — Z20822 Contact with and (suspected) exposure to covid-19: Principal | ICD-10-CM

## 2020-09-16 DIAGNOSIS — R109 Unspecified abdominal pain: Principal | ICD-10-CM

## 2020-09-16 DIAGNOSIS — K567 Ileus, unspecified: Principal | ICD-10-CM

## 2020-09-16 DIAGNOSIS — E876 Hypokalemia: Principal | ICD-10-CM

## 2020-09-16 DIAGNOSIS — K5981 Ogilvie syndrome: Principal | ICD-10-CM

## 2020-09-16 DIAGNOSIS — K9189 Other postprocedural complications and disorders of digestive system: Principal | ICD-10-CM

## 2020-09-16 DIAGNOSIS — G479 Sleep disorder, unspecified: Principal | ICD-10-CM

## 2020-09-16 DIAGNOSIS — E162 Hypoglycemia, unspecified: Principal | ICD-10-CM

## 2020-09-16 DIAGNOSIS — K2100 Gastro-esophageal reflux disease with esophagitis, without bleeding: Principal | ICD-10-CM

## 2020-09-16 DIAGNOSIS — F6381 Intermittent explosive disorder: Principal | ICD-10-CM

## 2020-09-17 DIAGNOSIS — R109 Unspecified abdominal pain: Principal | ICD-10-CM

## 2020-09-17 DIAGNOSIS — F840 Autistic disorder: Principal | ICD-10-CM

## 2020-09-17 DIAGNOSIS — K567 Ileus, unspecified: Principal | ICD-10-CM

## 2020-09-17 DIAGNOSIS — K562 Volvulus: Principal | ICD-10-CM

## 2020-09-17 DIAGNOSIS — J982 Interstitial emphysema: Principal | ICD-10-CM

## 2020-09-17 DIAGNOSIS — Z20822 Contact with and (suspected) exposure to covid-19: Principal | ICD-10-CM

## 2020-09-17 DIAGNOSIS — F6381 Intermittent explosive disorder: Principal | ICD-10-CM

## 2020-09-17 DIAGNOSIS — K5989 Other specified functional intestinal disorders: Principal | ICD-10-CM

## 2020-09-17 DIAGNOSIS — K2100 Gastro-esophageal reflux disease with esophagitis, without bleeding: Principal | ICD-10-CM

## 2020-09-17 DIAGNOSIS — K5981 Ogilvie syndrome: Principal | ICD-10-CM

## 2020-09-17 DIAGNOSIS — K5909 Other constipation: Principal | ICD-10-CM

## 2020-09-17 DIAGNOSIS — E162 Hypoglycemia, unspecified: Principal | ICD-10-CM

## 2020-09-17 DIAGNOSIS — E876 Hypokalemia: Principal | ICD-10-CM

## 2020-09-17 DIAGNOSIS — G40409 Other generalized epilepsy and epileptic syndromes, not intractable, without status epilepticus: Principal | ICD-10-CM

## 2020-09-17 DIAGNOSIS — G479 Sleep disorder, unspecified: Principal | ICD-10-CM

## 2020-09-17 DIAGNOSIS — K9189 Other postprocedural complications and disorders of digestive system: Principal | ICD-10-CM

## 2020-09-17 DIAGNOSIS — K5939 Other megacolon: Principal | ICD-10-CM

## 2020-09-19 DIAGNOSIS — K2100 Gastro-esophageal reflux disease with esophagitis, without bleeding: Principal | ICD-10-CM

## 2020-09-19 DIAGNOSIS — F6381 Intermittent explosive disorder: Principal | ICD-10-CM

## 2020-09-19 DIAGNOSIS — R109 Unspecified abdominal pain: Principal | ICD-10-CM

## 2020-09-19 DIAGNOSIS — G40409 Other generalized epilepsy and epileptic syndromes, not intractable, without status epilepticus: Principal | ICD-10-CM

## 2020-09-19 DIAGNOSIS — F840 Autistic disorder: Principal | ICD-10-CM

## 2020-09-19 DIAGNOSIS — K5909 Other constipation: Principal | ICD-10-CM

## 2020-09-19 DIAGNOSIS — G479 Sleep disorder, unspecified: Principal | ICD-10-CM

## 2020-09-19 DIAGNOSIS — E876 Hypokalemia: Principal | ICD-10-CM

## 2020-09-19 DIAGNOSIS — K9189 Other postprocedural complications and disorders of digestive system: Principal | ICD-10-CM

## 2020-09-19 DIAGNOSIS — K567 Ileus, unspecified: Principal | ICD-10-CM

## 2020-09-19 DIAGNOSIS — E162 Hypoglycemia, unspecified: Principal | ICD-10-CM

## 2020-09-19 DIAGNOSIS — K5939 Other megacolon: Principal | ICD-10-CM

## 2020-09-19 DIAGNOSIS — J982 Interstitial emphysema: Principal | ICD-10-CM

## 2020-09-19 DIAGNOSIS — K5989 Other specified functional intestinal disorders: Principal | ICD-10-CM

## 2020-09-19 DIAGNOSIS — K5981 Ogilvie syndrome: Principal | ICD-10-CM

## 2020-09-19 DIAGNOSIS — K562 Volvulus: Principal | ICD-10-CM

## 2020-09-19 DIAGNOSIS — Z20822 Contact with and (suspected) exposure to covid-19: Principal | ICD-10-CM

## 2020-09-22 ENCOUNTER — Telehealth (INDEPENDENT_AMBULATORY_CARE_PROVIDER_SITE_OTHER): Payer: Self-pay | Admitting: Pediatrics

## 2020-09-22 DIAGNOSIS — G40209 Localization-related (focal) (partial) symptomatic epilepsy and epileptic syndromes with complex partial seizures, not intractable, without status epilepticus: Secondary | ICD-10-CM

## 2020-09-22 DIAGNOSIS — F840 Autistic disorder: Secondary | ICD-10-CM

## 2020-09-22 DIAGNOSIS — K2100 Gastro-esophageal reflux disease with esophagitis, without bleeding: Principal | ICD-10-CM

## 2020-09-22 DIAGNOSIS — K5939 Other megacolon: Principal | ICD-10-CM

## 2020-09-22 DIAGNOSIS — R109 Unspecified abdominal pain: Principal | ICD-10-CM

## 2020-09-22 DIAGNOSIS — K567 Ileus, unspecified: Principal | ICD-10-CM

## 2020-09-22 DIAGNOSIS — F6381 Intermittent explosive disorder: Principal | ICD-10-CM

## 2020-09-22 DIAGNOSIS — G479 Sleep disorder, unspecified: Principal | ICD-10-CM

## 2020-09-22 DIAGNOSIS — Z20822 Contact with and (suspected) exposure to covid-19: Principal | ICD-10-CM

## 2020-09-22 DIAGNOSIS — E162 Hypoglycemia, unspecified: Principal | ICD-10-CM

## 2020-09-22 DIAGNOSIS — K562 Volvulus: Principal | ICD-10-CM

## 2020-09-22 DIAGNOSIS — E876 Hypokalemia: Principal | ICD-10-CM

## 2020-09-22 DIAGNOSIS — K9189 Other postprocedural complications and disorders of digestive system: Principal | ICD-10-CM

## 2020-09-22 DIAGNOSIS — J982 Interstitial emphysema: Principal | ICD-10-CM

## 2020-09-22 DIAGNOSIS — G40409 Other generalized epilepsy and epileptic syndromes, not intractable, without status epilepticus: Principal | ICD-10-CM

## 2020-09-22 DIAGNOSIS — K5989 Other specified functional intestinal disorders: Principal | ICD-10-CM

## 2020-09-22 DIAGNOSIS — K5909 Other constipation: Principal | ICD-10-CM

## 2020-09-22 DIAGNOSIS — K5981 Ogilvie syndrome: Principal | ICD-10-CM

## 2020-09-22 NOTE — Telephone Encounter (Signed)
Spoke with mom about her phone message. She states that he is in the hospital due to a blockage in his intestines. She reports that while in the hospital, he had three mild seizures but they were not gran mal. She states that the Depacon is not working and they cannot get the Felbatol in an IV. They cannot get him to swallow the pills or eat anything so they are requesting a different medication until they can get him back stable. Please advise.

## 2020-09-22 NOTE — Telephone Encounter (Signed)
  Who's calling (name and relationship to patient) : Agustin Cree (mom)  Best contact number: [phone]  Provider they see: Dr. Sharene Skeans  Reason for call: Mom states that patient has been spitting out his seizure medication and had three mild seizures yesterday. She is wondering if a change can be made to his medication. She notes that he is in the hospital right now and she is hoping there is something that can be placed in his IV.    PRESCRIPTION REFILL ONLY  Name of prescription:  Pharmacy:

## 2020-09-24 DIAGNOSIS — G40409 Other generalized epilepsy and epileptic syndromes, not intractable, without status epilepticus: Principal | ICD-10-CM

## 2020-09-24 DIAGNOSIS — J982 Interstitial emphysema: Principal | ICD-10-CM

## 2020-09-24 DIAGNOSIS — F6381 Intermittent explosive disorder: Principal | ICD-10-CM

## 2020-09-24 DIAGNOSIS — K562 Volvulus: Principal | ICD-10-CM

## 2020-09-24 DIAGNOSIS — K567 Ileus, unspecified: Principal | ICD-10-CM

## 2020-09-24 DIAGNOSIS — K5909 Other constipation: Principal | ICD-10-CM

## 2020-09-24 DIAGNOSIS — K5981 Ogilvie syndrome: Principal | ICD-10-CM

## 2020-09-24 DIAGNOSIS — R109 Unspecified abdominal pain: Principal | ICD-10-CM

## 2020-09-24 DIAGNOSIS — K5989 Other specified functional intestinal disorders: Principal | ICD-10-CM

## 2020-09-24 DIAGNOSIS — Z20822 Contact with and (suspected) exposure to covid-19: Principal | ICD-10-CM

## 2020-09-24 DIAGNOSIS — E162 Hypoglycemia, unspecified: Principal | ICD-10-CM

## 2020-09-24 DIAGNOSIS — K9189 Other postprocedural complications and disorders of digestive system: Principal | ICD-10-CM

## 2020-09-24 DIAGNOSIS — K2100 Gastro-esophageal reflux disease with esophagitis, without bleeding: Principal | ICD-10-CM

## 2020-09-24 DIAGNOSIS — G479 Sleep disorder, unspecified: Principal | ICD-10-CM

## 2020-09-24 DIAGNOSIS — F840 Autistic disorder: Principal | ICD-10-CM

## 2020-09-24 DIAGNOSIS — K5939 Other megacolon: Principal | ICD-10-CM

## 2020-09-24 DIAGNOSIS — E876 Hypokalemia: Principal | ICD-10-CM

## 2020-09-26 DIAGNOSIS — G478 Other sleep disorders: Principal | ICD-10-CM

## 2020-09-26 DIAGNOSIS — F840 Autistic disorder: Principal | ICD-10-CM

## 2020-09-26 DIAGNOSIS — G40309 Generalized idiopathic epilepsy and epileptic syndromes, not intractable, without status epilepticus: Principal | ICD-10-CM

## 2020-09-26 DIAGNOSIS — K219 Gastro-esophageal reflux disease without esophagitis: Principal | ICD-10-CM

## 2020-09-26 DIAGNOSIS — Z48815 Encounter for surgical aftercare following surgery on the digestive system: Principal | ICD-10-CM

## 2020-09-26 DIAGNOSIS — Z432 Encounter for attention to ileostomy: Principal | ICD-10-CM

## 2020-09-26 DIAGNOSIS — Z79899 Other long term (current) drug therapy: Principal | ICD-10-CM

## 2020-09-26 DIAGNOSIS — Z9049 Acquired absence of other specified parts of digestive tract: Principal | ICD-10-CM

## 2020-09-26 DIAGNOSIS — F6381 Intermittent explosive disorder: Principal | ICD-10-CM

## 2020-09-27 DIAGNOSIS — K567 Ileus, unspecified: Secondary | ICD-10-CM

## 2020-09-27 DIAGNOSIS — R140 Abdominal distension (gaseous): Secondary | ICD-10-CM

## 2020-09-29 MED ORDER — SIMETHICONE 40 MG/0.6 ML ORAL DROPS,SUSPENSION
Freq: Four times a day (QID) | ORAL | 1 refills | 7.00000 days | Status: SS | PRN
Start: 2020-09-29 — End: 2020-10-29

## 2020-09-29 MED ORDER — LOPERAMIDE 1 MG/7.5 ML ORAL LIQUID
Freq: Three times a day (TID) | ORAL | 1 refills | 30.00000 days | Status: SS
Start: 2020-09-29 — End: 2020-11-28
  Filled 2020-09-29: qty 30, 7d supply, fill #0
  Filled 2020-09-29: qty 240, 6d supply, fill #0

## 2020-09-30 ENCOUNTER — Inpatient Hospital Stay: Admit: 2020-09-30 | Discharge: 2020-10-21 | Payer: MEDICARE

## 2020-09-30 ENCOUNTER — Encounter: Admit: 2020-09-30 | Discharge: 2020-10-21 | Payer: MEDICARE

## 2020-09-30 DIAGNOSIS — K219 Gastro-esophageal reflux disease without esophagitis: Principal | ICD-10-CM

## 2020-09-30 DIAGNOSIS — F6381 Intermittent explosive disorder: Principal | ICD-10-CM

## 2020-09-30 DIAGNOSIS — Z9049 Acquired absence of other specified parts of digestive tract: Principal | ICD-10-CM

## 2020-09-30 DIAGNOSIS — Z48815 Encounter for surgical aftercare following surgery on the digestive system: Principal | ICD-10-CM

## 2020-09-30 DIAGNOSIS — F840 Autistic disorder: Principal | ICD-10-CM

## 2020-09-30 DIAGNOSIS — Z432 Encounter for attention to ileostomy: Principal | ICD-10-CM

## 2020-09-30 DIAGNOSIS — Z79899 Other long term (current) drug therapy: Principal | ICD-10-CM

## 2020-09-30 DIAGNOSIS — G40309 Generalized idiopathic epilepsy and epileptic syndromes, not intractable, without status epilepticus: Principal | ICD-10-CM

## 2020-09-30 DIAGNOSIS — G478 Other sleep disorders: Principal | ICD-10-CM

## 2020-10-01 DIAGNOSIS — Z48815 Encounter for surgical aftercare following surgery on the digestive system: Principal | ICD-10-CM

## 2020-10-01 DIAGNOSIS — F840 Autistic disorder: Principal | ICD-10-CM

## 2020-10-01 DIAGNOSIS — G40309 Generalized idiopathic epilepsy and epileptic syndromes, not intractable, without status epilepticus: Principal | ICD-10-CM

## 2020-10-01 DIAGNOSIS — Z9049 Acquired absence of other specified parts of digestive tract: Principal | ICD-10-CM

## 2020-10-01 DIAGNOSIS — Z79899 Other long term (current) drug therapy: Principal | ICD-10-CM

## 2020-10-01 DIAGNOSIS — G478 Other sleep disorders: Principal | ICD-10-CM

## 2020-10-01 DIAGNOSIS — F6381 Intermittent explosive disorder: Principal | ICD-10-CM

## 2020-10-01 DIAGNOSIS — Z432 Encounter for attention to ileostomy: Principal | ICD-10-CM

## 2020-10-01 DIAGNOSIS — K219 Gastro-esophageal reflux disease without esophagitis: Principal | ICD-10-CM

## 2020-10-02 DIAGNOSIS — F6381 Intermittent explosive disorder: Principal | ICD-10-CM

## 2020-10-02 DIAGNOSIS — Z432 Encounter for attention to ileostomy: Principal | ICD-10-CM

## 2020-10-02 DIAGNOSIS — Z48815 Encounter for surgical aftercare following surgery on the digestive system: Principal | ICD-10-CM

## 2020-10-02 DIAGNOSIS — K219 Gastro-esophageal reflux disease without esophagitis: Principal | ICD-10-CM

## 2020-10-02 DIAGNOSIS — F840 Autistic disorder: Principal | ICD-10-CM

## 2020-10-02 DIAGNOSIS — G478 Other sleep disorders: Principal | ICD-10-CM

## 2020-10-02 DIAGNOSIS — Z79899 Other long term (current) drug therapy: Principal | ICD-10-CM

## 2020-10-02 DIAGNOSIS — G40309 Generalized idiopathic epilepsy and epileptic syndromes, not intractable, without status epilepticus: Principal | ICD-10-CM

## 2020-10-02 DIAGNOSIS — Z9049 Acquired absence of other specified parts of digestive tract: Principal | ICD-10-CM

## 2020-10-06 ENCOUNTER — Telehealth: Payer: Medicaid Other | Admitting: Surgery

## 2020-10-07 MED ORDER — DIVALPROEX SODIUM 125 MG PO CSDR: DELAYED_RELEASE_CAPSULE | ORAL | 5 refills | Status: DC

## 2020-10-07 NOTE — Addendum Note (Signed)
Addended by: Deetta Perla on: 10/07/2020 01:40 PM   Modules accepted: Orders

## 2020-10-07 NOTE — Telephone Encounter (Signed)
I sent divalproex capsules which you can open up.  He will take 2 - 3 times daily.  This works out to 750 mg which is a little more than 15 mg/kg but is a good place to start.  You can sprinkle the contents into yogurt, pudding, or whatever else he will take.  There is no taste to them.  I sent them to [facility] in [place].  Please keep me informed.

## 2020-10-08 ENCOUNTER — Ambulatory Visit: Admit: 2020-10-08 | Discharge: 2020-10-09 | Payer: MEDICARE

## 2020-10-08 ENCOUNTER — Encounter: Admit: 2020-10-08 | Discharge: 2020-10-09 | Payer: MEDICARE

## 2020-10-08 DIAGNOSIS — Z79899 Other long term (current) drug therapy: Principal | ICD-10-CM

## 2020-10-08 DIAGNOSIS — K5909 Other constipation: Principal | ICD-10-CM

## 2020-10-08 DIAGNOSIS — Z20822 Contact with and (suspected) exposure to covid-19: Principal | ICD-10-CM

## 2020-10-08 DIAGNOSIS — R627 Adult failure to thrive: Principal | ICD-10-CM

## 2020-10-08 DIAGNOSIS — R000 Tachycardia, unspecified: Principal | ICD-10-CM

## 2020-10-08 DIAGNOSIS — E860 Dehydration: Principal | ICD-10-CM

## 2020-10-08 DIAGNOSIS — N179 Acute kidney failure, unspecified: Principal | ICD-10-CM

## 2020-10-08 DIAGNOSIS — K5989 Primary chronic pseudo-obstruction of large intestine: Principal | ICD-10-CM

## 2020-10-08 DIAGNOSIS — K219 Gastro-esophageal reflux disease without esophagitis: Principal | ICD-10-CM

## 2020-10-08 DIAGNOSIS — F840 Autistic disorder: Principal | ICD-10-CM

## 2020-10-09 DIAGNOSIS — G40309 Generalized idiopathic epilepsy and epileptic syndromes, not intractable, without status epilepticus: Principal | ICD-10-CM

## 2020-10-09 DIAGNOSIS — Z432 Encounter for attention to ileostomy: Principal | ICD-10-CM

## 2020-10-09 DIAGNOSIS — F840 Autistic disorder: Principal | ICD-10-CM

## 2020-10-09 DIAGNOSIS — Z48815 Encounter for surgical aftercare following surgery on the digestive system: Principal | ICD-10-CM

## 2020-10-09 DIAGNOSIS — K219 Gastro-esophageal reflux disease without esophagitis: Principal | ICD-10-CM

## 2020-10-09 DIAGNOSIS — G478 Other sleep disorders: Principal | ICD-10-CM

## 2020-10-09 DIAGNOSIS — Z79899 Other long term (current) drug therapy: Principal | ICD-10-CM

## 2020-10-09 DIAGNOSIS — Z9049 Acquired absence of other specified parts of digestive tract: Principal | ICD-10-CM

## 2020-10-09 DIAGNOSIS — F6381 Intermittent explosive disorder: Principal | ICD-10-CM

## 2020-10-13 DIAGNOSIS — Z79899 Other long term (current) drug therapy: Principal | ICD-10-CM

## 2020-10-13 DIAGNOSIS — G478 Other sleep disorders: Principal | ICD-10-CM

## 2020-10-13 DIAGNOSIS — Z48815 Encounter for surgical aftercare following surgery on the digestive system: Principal | ICD-10-CM

## 2020-10-13 DIAGNOSIS — Z432 Encounter for attention to ileostomy: Principal | ICD-10-CM

## 2020-10-13 DIAGNOSIS — G40309 Generalized idiopathic epilepsy and epileptic syndromes, not intractable, without status epilepticus: Principal | ICD-10-CM

## 2020-10-13 DIAGNOSIS — Z9049 Acquired absence of other specified parts of digestive tract: Principal | ICD-10-CM

## 2020-10-13 DIAGNOSIS — F840 Autistic disorder: Principal | ICD-10-CM

## 2020-10-13 DIAGNOSIS — K219 Gastro-esophageal reflux disease without esophagitis: Principal | ICD-10-CM

## 2020-10-13 DIAGNOSIS — F6381 Intermittent explosive disorder: Principal | ICD-10-CM

## 2020-10-20 DIAGNOSIS — F6381 Intermittent explosive disorder: Principal | ICD-10-CM

## 2020-10-20 DIAGNOSIS — Z9049 Acquired absence of other specified parts of digestive tract: Principal | ICD-10-CM

## 2020-10-20 DIAGNOSIS — K219 Gastro-esophageal reflux disease without esophagitis: Principal | ICD-10-CM

## 2020-10-20 DIAGNOSIS — Z79899 Other long term (current) drug therapy: Principal | ICD-10-CM

## 2020-10-20 DIAGNOSIS — G40309 Generalized idiopathic epilepsy and epileptic syndromes, not intractable, without status epilepticus: Principal | ICD-10-CM

## 2020-10-20 DIAGNOSIS — F840 Autistic disorder: Principal | ICD-10-CM

## 2020-10-20 DIAGNOSIS — Z48815 Encounter for surgical aftercare following surgery on the digestive system: Principal | ICD-10-CM

## 2020-10-20 DIAGNOSIS — Z432 Encounter for attention to ileostomy: Principal | ICD-10-CM

## 2020-10-20 DIAGNOSIS — G478 Other sleep disorders: Principal | ICD-10-CM

## 2020-10-22 ENCOUNTER — Encounter: Admit: 2020-10-22 | Discharge: 2020-11-20 | Payer: MEDICARE

## 2020-10-22 DIAGNOSIS — F6381 Intermittent explosive disorder: Principal | ICD-10-CM

## 2020-10-22 DIAGNOSIS — G40309 Generalized idiopathic epilepsy and epileptic syndromes, not intractable, without status epilepticus: Principal | ICD-10-CM

## 2020-10-22 DIAGNOSIS — G478 Other sleep disorders: Principal | ICD-10-CM

## 2020-10-22 DIAGNOSIS — F840 Autistic disorder: Principal | ICD-10-CM

## 2020-10-22 DIAGNOSIS — Z432 Encounter for attention to ileostomy: Principal | ICD-10-CM

## 2020-10-22 DIAGNOSIS — K219 Gastro-esophageal reflux disease without esophagitis: Principal | ICD-10-CM

## 2020-10-22 DIAGNOSIS — Z9049 Acquired absence of other specified parts of digestive tract: Principal | ICD-10-CM

## 2020-10-22 DIAGNOSIS — Z79899 Other long term (current) drug therapy: Principal | ICD-10-CM

## 2020-10-22 DIAGNOSIS — Z48815 Encounter for surgical aftercare following surgery on the digestive system: Principal | ICD-10-CM

## 2020-10-27 ENCOUNTER — Telehealth: Admit: 2020-10-27 | Discharge: 2020-10-28 | Payer: MEDICARE

## 2020-10-27 DIAGNOSIS — E860 Dehydration: Principal | ICD-10-CM

## 2020-10-28 DIAGNOSIS — Z48815 Encounter for surgical aftercare following surgery on the digestive system: Principal | ICD-10-CM

## 2020-10-28 DIAGNOSIS — F840 Autistic disorder: Principal | ICD-10-CM

## 2020-10-28 DIAGNOSIS — Z432 Encounter for attention to ileostomy: Principal | ICD-10-CM

## 2020-10-28 DIAGNOSIS — Z79899 Other long term (current) drug therapy: Principal | ICD-10-CM

## 2020-10-28 DIAGNOSIS — G478 Other sleep disorders: Principal | ICD-10-CM

## 2020-10-28 DIAGNOSIS — Z9049 Acquired absence of other specified parts of digestive tract: Principal | ICD-10-CM

## 2020-10-28 DIAGNOSIS — G40309 Generalized idiopathic epilepsy and epileptic syndromes, not intractable, without status epilepticus: Principal | ICD-10-CM

## 2020-10-28 DIAGNOSIS — K219 Gastro-esophageal reflux disease without esophagitis: Principal | ICD-10-CM

## 2020-10-28 DIAGNOSIS — F6381 Intermittent explosive disorder: Principal | ICD-10-CM

## 2020-11-10 ENCOUNTER — Encounter
Admit: 2020-11-10 | Discharge: 2020-11-16 | Disposition: A | Discharge status: Home / Self Care | Payer: MEDICARE | Attending: Registered Nurse

## 2020-11-10 ENCOUNTER — Ambulatory Visit: Admit: 2020-11-10 | Discharge: 2020-11-16 | Disposition: A | Discharge status: Home / Self Care | Payer: MEDICARE

## 2020-11-16 MED ORDER — SIMETHICONE 40 MG/0.6 ML ORAL DROPS,SUSPENSION
Freq: Four times a day (QID) | ORAL | 1 refills | 7.00000 days | PRN
Start: 2020-11-16 — End: 2020-12-16

## 2020-12-01 ENCOUNTER — Encounter: Admit: 2020-12-01 | Discharge: 2020-12-02 | Payer: MEDICARE

## 2021-01-01 ENCOUNTER — Telehealth: Admit: 2021-01-01 | Discharge: 2021-01-02 | Payer: MEDICARE

## 2021-01-12 ENCOUNTER — Telehealth (INDEPENDENT_AMBULATORY_CARE_PROVIDER_SITE_OTHER): Payer: Self-pay | Admitting: Pediatrics

## 2021-01-12 NOTE — Telephone Encounter (Signed)
Mom called this morning wanting to make sure the appt she has scheduled for the right day that the person that will be taking over for Dr. Sharene Skeans will be here. I assumed it was Dr. Moody Bruins but mom said she thought it was Dr. Artis Flock I did adjust the schedule to a day both Dr. Sharene Skeans and Dr. Moody Bruins  will be here but wanted to verify it was not Dr. Artis Flock because I may have to adjust the schedule again if that is the case. Please Advise

## 2021-01-12 NOTE — Telephone Encounter (Signed)
Abrahm's major problem is his autism and behavior, not his seizures which have been in very good control.  As such, I think that it would better for him to see Dr. Artis Flock long-term and then Dr. Lezlie Lye.  This is in my note of July 25, 2020.

## 2021-01-12 NOTE — Telephone Encounter (Signed)
Called mom back and moved appt back to original scheduled date for appt with Dr. Sharene Skeans and Dr. Artis Flock

## 2021-01-12 NOTE — Telephone Encounter (Signed)
Spoke with Mrs. Jonathan Kirby about the phone message. Please advise

## 2021-01-25 ENCOUNTER — Encounter (INDEPENDENT_AMBULATORY_CARE_PROVIDER_SITE_OTHER): Payer: Self-pay

## 2021-01-27 ENCOUNTER — Encounter (INDEPENDENT_AMBULATORY_CARE_PROVIDER_SITE_OTHER): Payer: Self-pay | Admitting: *Deleted

## 2021-01-28 ENCOUNTER — Ambulatory Visit: Admit: 2021-01-28 | Discharge: 2021-01-31 | Disposition: A | Discharge status: Home / Self Care | Payer: MEDICARE

## 2021-02-23 ENCOUNTER — Ambulatory Visit (INDEPENDENT_AMBULATORY_CARE_PROVIDER_SITE_OTHER): Payer: Medicaid Other | Admitting: Pediatrics

## 2021-03-13 ENCOUNTER — Ambulatory Visit
Admit: 2021-03-13 | Discharge: 2021-03-23 | Disposition: A | Discharge status: Home / Self Care | Payer: PRIVATE HEALTH INSURANCE

## 2021-03-13 ENCOUNTER — Encounter
Admit: 2021-03-13 | Discharge: 2021-03-23 | Disposition: A | Discharge status: Home / Self Care | Payer: PRIVATE HEALTH INSURANCE | Attending: Student in an Organized Health Care Education/Training Program

## 2021-03-18 ENCOUNTER — Ambulatory Visit (INDEPENDENT_AMBULATORY_CARE_PROVIDER_SITE_OTHER): Payer: Medicaid Other | Admitting: Pediatrics

## 2021-03-23 MED ORDER — IBUPROFEN 100 MG/5 ML ORAL SUSPENSION
Freq: Four times a day (QID) | ORAL | 0 refills | 14 days | PRN
Start: 2021-03-23 — End: 2021-04-06

## 2021-03-23 MED ORDER — SIMETHICONE 40 MG/0.6 ML ORAL DROPS,SUSPENSION
Freq: Four times a day (QID) | ORAL | 0 refills | 13 days | Status: CP | PRN
Start: 2021-03-23 — End: 2021-04-06

## 2021-03-23 MED ORDER — ACETAMINOPHEN 650 MG/20.3 ML ORAL SOLUTION
Freq: Four times a day (QID) | ORAL | 0 refills | 0 days | PRN
Start: 2021-03-23 — End: 2021-04-06

## 2021-03-25 DIAGNOSIS — K56609 Unspecified intestinal obstruction, unspecified as to partial versus complete obstruction: Principal | ICD-10-CM

## 2021-03-29 ENCOUNTER — Encounter (INDEPENDENT_AMBULATORY_CARE_PROVIDER_SITE_OTHER): Payer: Self-pay

## 2021-04-05 ENCOUNTER — Other Ambulatory Visit (INDEPENDENT_AMBULATORY_CARE_PROVIDER_SITE_OTHER): Payer: Self-pay | Admitting: Pediatrics

## 2021-04-05 DIAGNOSIS — G40209 Localization-related (focal) (partial) symptomatic epilepsy and epileptic syndromes with complex partial seizures, not intractable, without status epilepticus: Secondary | ICD-10-CM

## 2021-04-05 DIAGNOSIS — G40309 Generalized idiopathic epilepsy and epileptic syndromes, not intractable, without status epilepticus: Secondary | ICD-10-CM

## 2021-04-06 ENCOUNTER — Encounter: Admit: 2021-04-06 | Discharge: 2021-04-07 | Payer: MEDICARE

## 2021-04-07 DIAGNOSIS — K219 Gastro-esophageal reflux disease without esophagitis: Principal | ICD-10-CM

## 2021-04-07 DIAGNOSIS — R1310 Dysphagia, unspecified: Principal | ICD-10-CM

## 2021-04-15 ENCOUNTER — Ambulatory Visit: Admit: 2021-04-15 | Discharge: 2021-04-20 | Disposition: A | Discharge status: Home / Self Care | Payer: MEDICARE

## 2021-04-20 MED ORDER — SIMETHICONE 40 MG/0.6 ML ORAL DROPS,SUSPENSION
Freq: Four times a day (QID) | ORAL | 0 refills | 13 days | PRN
Start: 2021-04-20 — End: 2021-05-04

## 2021-05-02 ENCOUNTER — Encounter
Admit: 2021-05-02 | Discharge: 2021-05-02 | Disposition: A | Discharge status: Home / Self Care | Payer: MEDICARE | Attending: Emergency Medicine

## 2021-05-02 ENCOUNTER — Emergency Department
Admit: 2021-05-02 | Discharge: 2021-05-02 | Disposition: A | Discharge status: Home / Self Care | Payer: MEDICARE | Attending: Emergency Medicine

## 2021-05-02 DIAGNOSIS — R7989 Other specified abnormal findings of blood chemistry: Principal | ICD-10-CM

## 2021-05-02 DIAGNOSIS — R197 Diarrhea, unspecified: Principal | ICD-10-CM

## 2021-05-02 DIAGNOSIS — K529 Noninfective gastroenteritis and colitis, unspecified: Principal | ICD-10-CM

## 2021-05-02 DIAGNOSIS — E860 Dehydration: Principal | ICD-10-CM

## 2021-05-07 ENCOUNTER — Other Ambulatory Visit: Payer: Self-pay | Admitting: Family

## 2021-05-07 DIAGNOSIS — G40209 Localization-related (focal) (partial) symptomatic epilepsy and epileptic syndromes with complex partial seizures, not intractable, without status epilepticus: Secondary | ICD-10-CM

## 2021-05-07 DIAGNOSIS — G40309 Generalized idiopathic epilepsy and epileptic syndromes, not intractable, without status epilepticus: Secondary | ICD-10-CM

## 2021-05-08 ENCOUNTER — Encounter (INDEPENDENT_AMBULATORY_CARE_PROVIDER_SITE_OTHER): Payer: Self-pay | Admitting: Pediatrics

## 2021-05-08 ENCOUNTER — Ambulatory Visit (INDEPENDENT_AMBULATORY_CARE_PROVIDER_SITE_OTHER): Payer: Medicare Other | Admitting: Pediatrics

## 2021-05-08 ENCOUNTER — Encounter (INDEPENDENT_AMBULATORY_CARE_PROVIDER_SITE_OTHER): Payer: Self-pay

## 2021-05-08 ENCOUNTER — Other Ambulatory Visit: Payer: Self-pay

## 2021-05-08 VITALS — Wt 88.0 lb

## 2021-05-08 DIAGNOSIS — F840 Autistic disorder: Secondary | ICD-10-CM

## 2021-05-08 DIAGNOSIS — G830 Diplegia of upper limbs: Secondary | ICD-10-CM | POA: Insufficient documentation

## 2021-05-08 DIAGNOSIS — G40209 Localization-related (focal) (partial) symptomatic epilepsy and epileptic syndromes with complex partial seizures, not intractable, without status epilepticus: Secondary | ICD-10-CM | POA: Diagnosis not present

## 2021-05-08 DIAGNOSIS — G40309 Generalized idiopathic epilepsy and epileptic syndromes, not intractable, without status epilepticus: Secondary | ICD-10-CM

## 2021-05-08 NOTE — Patient Instructions (Signed)
It has been a pleasure and a privilege to care for Jonathan Kirby over his lifetime.  I believe that you are in good hands with Dr. Lorenz Coaster.  I did not take her recommendation to order speech therapy because I am not certain what a speech therapist can do without observing his swallowing problems.  I also believe that this is a cognitive behavioral issue rather than a structural 1.  It is for that reason I recommended that you put the liquid felbamate into a syringe put it in the side of his mouth and pushed toward the corner of his mouth.  The liquid will go down and he should not choke.  This may decrease the amount of struggle associated with getting medication into him.  I will remember you fondly and wish you well.

## 2021-05-08 NOTE — Progress Notes (Signed)
Patient: Jonathan Kirby MRN: [id number] Sex: male DOB: January 05, 1986  Provider: Wyline Copas, MD Location of Care: [facility]  Note type: Routine return visit  History of Present Illness: Referral Source: DavidJohnson History from: both parents, patient, and [facility] chart Chief Complaint: Seizures  Jonathan Kirby is a 35 y.o. male who was evaluated May 07, 2021 for the first time since July 25, 2020.  Fernandez has autism spectrum disorder, level 3.  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  He had uncontrollable seizures until he was placed on felbamate over 20 years ago.  Felbamate completely controlled his seizures.  He has had issues with agitation.  Haloperidol had been used to treat that but is not working well.  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  Prior to that he had very severe constipation.  He has had an upper endoscopy which shows no anatomical abnormalities.  He has difficulty swallowing which at one point was related to esophagitis, but currently is thought to be a functional issue.  This is made it very difficult to get his felbamate liquid into him.  He is not taking any other medications at this time.  When he came off of Felbatol during his hospitalization a month ago he had seizures but no grand mals.  Divalproex was tried but did not work.  When Felbatol was restarted, seizures again ceased.  We had a discussion about switching him to felbamate tablets but because he has a limited menu foods that he will eat, his parents did not want to try to crush them and put them in food although he does take apple sauce and I think that would work.  Instead I talked to them about using a syringe to put the liquid in the side of his mouth and to push it down the lateral aspect of his  throat which I think will go down without aspiration.  He is quite thin, he was malnourished.  He has not contracted COVID, nor have his parents who are both vaccinated.  They have been reluctant to vaccinate him.  Review of Systems: A complete review of systems was remarkable for patient is here to be seen for a follow up.Mom reports that the patient has been experiencing GI problems and that has caused him to lose a significant amount of weight.She also reports that while the patient was in the hospital a month ago, he experienced some seizures but they were not gran mal.She reports no other concerns, all other systems reviewed and negative.  Past Medical History Diagnosis Date   Autism    GERD (gastroesophageal reflux disease)    Seizures ([place])    Hospitalizations: No., Head Injury: No., Nervous System Infections: No., Immunizations up to date: Yes.    Copied from prior chart notes. He had a mixture of complex partial seizures with secondary generalization, and myoclonic seizures.  These were quite frequent, and caused significant impairment in his function.  Once we were able to bring seizures under control Felbatol, he showed a great deal of anxiety and depression.  This has improved as he has become older.  For the most part his seizures have been well controlled on Felbatol.  Most recurrent seizures have occurred in the setting of forgotten doses of medication.  He also has problems with constipation.  Behavior History autism spectrum disorder, level 3; he becomes aggressive when he is anxious and with changes in routine.    Surgical History Procedure Laterality Date   CIRCUMCISION  1987   ESOPHAGOGASTRODUODENOSCOPY N/A 04/17/2020   Procedure: ESOPHAGOGASTRODUODENOSCOPY (EGD);  Surgeon: Virgel Manifold, MD;  Location: [facility] ENDOSCOPY;  Service: Endoscopy;  Laterality: N/A;   ESOPHAGOGASTRODUODENOSCOPY (EGD) WITH PROPOFOL N/A 08/28/2020   Procedure: ESOPHAGOGASTRODUODENOSCOPY  (EGD) WITH PROPOFOL;  Surgeon: Lin Landsman, MD;  Location: [facility] ENDOSCOPY;  Service: Gastroenterology;  Laterality: N/A;   Family History family history includes Alzheimer's disease in his maternal grandfather; Cancer in his maternal grandmother; Stroke in his paternal grandfather. Family history is negative for migraines, seizures, intellectual disabilities, blindness, deafness, birth defects, chromosomal disorder, or autism.  Social History Socioeconomic History   Marital status: Single   Years of education: 13+   Highest education level: High school certificate  Tobacco Use   Smoking status: Never   Smokeless tobacco: Never  Substance and Sexual Activity   Alcohol use: No    Alcohol/week: 0.0 standard drinks   Drug use: No   Sexual activity: Never  Social History Narrative   [name] does not attend any school or day program at this time.    He lives with his parents.    He enjoys listening to music, playing with remote control cars, and collecting newspapers.   No Known Allergies  Physical Exam Wt 88 lb (39.9 kg)   BMI 14.64 kg/m   General: alert, well developed, well nourished, in no acute distress, black hair, brown eyes,  even- handed Head: normocephalic, no dysmorphic features Ears, Nose and Throat: Otoscopic: tympanic membranes occluded with wax Neck: supple, full range of motion, no cranial or cervical bruits Respiratory: auscultation clear Cardiovascular: no murmurs, pulses are normal Musculoskeletal: no skeletal deformities or apparent scoliosis Skin: no rashes or neurocutaneous lesions  Neurologic Exam  Mental Status: Awake with both hands covering over his eyes he resists looking at the examiner; he does not speak nor follow commands Cranial Nerves: visual fields cannot be tested, but I know that he sees; extraocular movements cannot be tested; pupils are round, reactive to light; funduscopic examination shows bilateral positive red reflex; symmetric,  impassive facial strength; unable to test hearing, by history he is able to localize sound Motor: normal functional strength, tone and mass; unable to assess fine motor movements; unable to test pronator drift Sensory: withdrawal x4 Coordination: No tremor otherwise unable to assess Gait and Station: diplegic, lurching gait and station; he needs to walk holding onto his parents to keep from losing his balance Reflexes: symmetric and diminished bilaterally; no clonus; bilateral flexor plantar responses   Assessment 1.  Generalized convulsive epilepsy, G40.309. 2.  Focal epilepsy with impairment of consciousness, G40.209. 3.  Autism spectrum disorder with accompanying language impairment and intellectual disability requiring very substantial support, level 3, F84.0. 4.  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  Discussion Murdock is medically and neurologically stable.  It is difficult to get felbamate into him.    Plan As mentioned above I suggested the parents use a syringe and push the fluid down the side of his throat which should allow him to swallow without choking.  If this fails I would recommend tablets that are crushed and placed in applesauce.  Since felbamate has worked so well to control his seizures there is no reason to change anything at this time.  Greater than 50% of a 30-minute visit was spent in counseling coordination of care concerning  his seizures, autism, and discussing transition of care.  The family met with Dr. Carylon Perches who suggested that we consider a feeding therapist.  I am reluctant to do that at this time because we do not have the ability to adequately assess his swallowing apparatus.  He will return in 3 months time to be seen by Dr. Rogers Blocker on a Thursday in her complex care clinic day.   Medication List    Accurate as of May 08, 2021  8:40 PM. If you have any questions, ask your nurse or doctor.     felbamate 600 MG/5ML suspension Commonly known as: FELBATOL TAKE 7.5  MLS BY MOUTH TWICE DAILY     The medication list was reviewed and reconciled. All changes or newly prescribed medications were explained.  A complete medication list was provided to the patient/caregiver.  Jodi Geralds MD

## 2021-05-09 ENCOUNTER — Ambulatory Visit: Admit: 2021-05-09 | Discharge: 2021-05-11 | Payer: MEDICARE

## 2021-05-18 ENCOUNTER — Ambulatory Visit
Admit: 2021-05-18 | Discharge: 2021-05-26 | Disposition: A | Discharge status: Home / Self Care | Payer: MEDICARE | Source: Ambulatory Visit

## 2021-05-18 ENCOUNTER — Encounter
Admit: 2021-05-18 | Discharge: 2021-05-26 | Payer: MEDICARE | Attending: Student in an Organized Health Care Education/Training Program

## 2021-05-18 ENCOUNTER — Ambulatory Visit: Admit: 2021-05-18 | Discharge: 2021-05-19 | Payer: MEDICARE

## 2021-05-18 ENCOUNTER — Encounter: Admit: 2021-05-18 | Payer: MEDICARE | Attending: Student in an Organized Health Care Education/Training Program

## 2021-05-18 DIAGNOSIS — K56609 Unspecified intestinal obstruction, unspecified as to partial versus complete obstruction: Principal | ICD-10-CM

## 2021-05-28 ENCOUNTER — Emergency Department: Payer: Medicare Other

## 2021-05-28 ENCOUNTER — Encounter: Payer: Self-pay | Admitting: Physician Assistant

## 2021-05-28 ENCOUNTER — Emergency Department
Admission: EM | Admit: 2021-05-28 | Discharge: 2021-05-28 | Disposition: A | Discharge status: Home / Self Care | Payer: Medicare Other | Attending: Emergency Medicine | Admitting: Emergency Medicine

## 2021-05-28 ENCOUNTER — Other Ambulatory Visit: Payer: Self-pay

## 2021-05-28 ENCOUNTER — Encounter: Admit: 2021-05-28 | Discharge: 2021-05-28 | Payer: MEDICARE

## 2021-05-28 DIAGNOSIS — T82594A Other mechanical complication of infusion catheter, initial encounter: Secondary | ICD-10-CM | POA: Diagnosis present

## 2021-05-28 DIAGNOSIS — T82898A Other specified complication of vascular prosthetic devices, implants and grafts, initial encounter: Secondary | ICD-10-CM

## 2021-05-28 DIAGNOSIS — X58XXXA Exposure to other specified factors, initial encounter: Secondary | ICD-10-CM | POA: Diagnosis not present

## 2021-05-28 DIAGNOSIS — K56609 Unspecified intestinal obstruction, unspecified as to partial versus complete obstruction: Principal | ICD-10-CM

## 2021-05-28 NOTE — ED Notes (Signed)
IV team in with pt and family

## 2021-05-28 NOTE — Progress Notes (Signed)
VAST consult received to assess PICC. Pt's Mom reported patient went to bathroom this morning and did not bring the IV pole with him. The IV tubing administering  his TPN snapped and broke. Mom also concerned line might have been pulled out some. Pt with LA DL PICC in place; dressing intact, clean and dry with chlorhexadine gel in place. PICC catheter visible through dressing and appears stable; both lumens with good blood return and easy to flush. Replaced needleless cap on red lumen after scrubbing hub appropriately. Pink protective sock placed over site per parent request.

## 2021-05-28 NOTE — ED Triage Notes (Signed)
Pt arrived to ed via ems from home with mother. Mother states she got up to go to restroom and  picc line tubing was no longer connected to line. Mother concerned that picc line was pulled out. Pt line still appears intact at insertion site. Appears may be issue with cap. NAD noted at this time

## 2021-05-28 NOTE — ED Provider Notes (Signed)
[facility] Emergency Department Provider Note ____________________________________________  Time seen: 81  I have reviewed the triage vital signs and the nursing notes.  HISTORY  Chief Complaint  picc line issue   HPI TREON KEHL is a 35 y.o. male with a history of seizures and feeding disorder, presents to the ED for evaluation of his PICC line.  Patient had to go to the restroom overnight, and did not bring his IV pole with him.  He apparently snatched the IV line from the PICC line tubing.  Mom is concerned that the PICC line was dislodged during the incident.  He presents to the ED for evaluation of PICC line placement.  According to triage nursing staff, it appears that the catheter cap may have broken off.  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   Abdominal pain 08/21/2020   Hypokalemia 08/21/2020   Hypomagnesemia 08/21/2020   Acute esophagitis    Seizure disorder (HCC)    Constipation 04/14/2020   Decreased oral intake 04/14/2020   Dysphagia 04/14/2020   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   Autism spectrum disorder with accompanying language impairment and intellectual disability, requiring very substantial support 07/23/2014   Generalized convulsive epilepsy (HCC) 04/02/2013   Partial epilepsy with impairment of consciousness (HCC) 04/02/2013   Moderate intellectual disabilities 04/02/2013   Insomnia, unspecified 04/02/2013    Intermittent explosive disorder 04/02/2013   Mental and behavioral problem 20[birth year]Sep[birth year]1986    Past Surgical History:  Procedure Laterality Date   CIRCUMCISION  1987   ESOPHAGOGASTRODUODENOSCOPY N/A 04/17/2020   Procedure: ESOPHAGOGASTRODUODENOSCOPY (EGD);  Surgeon: Pasty Spillers, MD;  Location: [facility] ENDOSCOPY;  Service: Endoscopy;  Laterality: N/A;   ESOPHAGOGASTRODUODENOSCOPY (EGD) WITH PROPOFOL N/A 08/28/2020   Procedure: ESOPHAGOGASTRODUODENOSCOPY (EGD) WITH PROPOFOL;  Surgeon: Toney Reil, MD;  Location: [facility] ENDOSCOPY;  Service: Gastroenterology;  Laterality: N/A;    Prior to Admission medications   Medication Sig Start Date End Date Taking? Authorizing Provider  felbamate (FELBATOL) 600 MG/5ML suspension TAKE 7.5 MLS BY MOUTH TWICE DAILY 05/07/21   Deetta Perla, MD    Allergies Patient has no known allergies.  Family History  Problem Relation Age of Onset   Cancer Maternal Grandmother        Died at 53   Alzheimer's disease Maternal Grandfather        Died at 6   Stroke Paternal Grandfather        Died at 36    Social History Social History   Tobacco Use   Smoking status: Never   Smokeless tobacco: Never  Substance Use Topics   Alcohol use: No    Alcohol/week: 0.0 standard drinks   Drug use: No    Review of Systems  Constitutional: Negative for fever. Eyes: Negative for visual changes. ENT: Negative for sore throat. Cardiovascular: Negative for chest pain. Respiratory: Negative for shortness of breath. Gastrointestinal: Negative for abdominal pain, vomiting and diarrhea. Genitourinary: Negative for dysuria. Musculoskeletal: Negative for  back pain. Skin: Negative for rash. Neurological: Negative for headaches, focal weakness or numbness. ____________________________________________  PHYSICAL EXAM:  VITAL SIGNS: ED Triage Vitals  Enc Vitals Group     BP 05/28/21 0612 108/76     Pulse Rate 05/28/21 0612 73     Resp 05/28/21 0612 16      Temp 05/28/21 0612 97.7 F (36.5 C)     Temp Source 05/28/21 0612 Axillary     SpO2 05/28/21 0612 100 %     Weight 05/28/21 0616 103 lb 9.9 oz (47 kg)     Height 05/28/21 0616 5\' 5"  (1.651 m)     Head Circumference --      Peak Flow --      Pain Score --      Pain Loc --      Pain Edu? --      Excl. in GC? --     Constitutional: Alert and oriented. Well appearing and in no distress. Head: Normocephalic and atraumatic. Eyes: Conjunctivae are normal. Normal extraocular movements Cardiovascular: Normal rate, regular rhythm. Normal distal pulses. Respiratory: Normal respiratory effort.  Musculoskeletal: Nontender with normal range of motion in all extremities.  Skin:  Skin is warm, dry and intact. No rash noted. LUE PICC line noted with intact dressing in place.  No indication of dislodged catheter at this time. ____________________________________________    {LABS (pertinent positives/negatives)  ____________________________________________  {EKG  ____________________________________________   RADIOLOGY Official radiology report(s): No results found. ____________________________________________  PROCEDURES  IV Team RN at bedside  Procedures ____________________________________________   INITIAL IMPRESSION / ASSESSMENT AND PLAN / ED COURSE  As part of my medical decision making, I reviewed the following data within the electronic MEDICAL RECORD NUMBER History obtained from family and Notes from prior ED visits   ED patient present with his parents/legal guardians, for evaluation of PICC line placement.  Patient apparently got up this morning but did not pull the IV pole with him, and experienced a disruption from the IV tubing.  Patient is evaluated in the ED by the [facility] nurse, confirms good placement of the PICC line with normal blood return and flushing.  The hubcap is replaced, and a protective sock was placed over the catheter.  Patient is discharged to the care of his  parents with no further follow-up necessary.   MAURILIO PURYEAR was evaluated in Emergency Department on 05/28/2021 for the symptoms described in the history of present illness. He was evaluated in the context of the global COVID-19 pandemic, which necessitated consideration that the patient might be at risk for infection with the SARS-CoV-2 virus that causes COVID-19. Institutional protocols and algorithms that pertain to the evaluation of patients at risk for COVID-19 are in a state of rapid change based on information released by regulatory bodies including the CDC and federal and state organizations. These policies and algorithms were followed during the patient's care in the ED. ____________________________________________  FINAL CLINICAL IMPRESSION(S) / ED DIAGNOSES  Final diagnoses:  Occluded PICC line, initial encounter [facility])      [facility], PA-C 05/28/21 1556    07/28/21, MD 05/29/21 [id number]

## 2021-06-01 ENCOUNTER — Encounter: Admit: 2021-06-01 | Discharge: 2021-06-01 | Payer: MEDICARE

## 2021-06-01 DIAGNOSIS — K56609 Unspecified intestinal obstruction, unspecified as to partial versus complete obstruction: Principal | ICD-10-CM

## 2021-06-04 ENCOUNTER — Encounter: Admit: 2021-06-04 | Discharge: 2021-06-04 | Payer: MEDICARE

## 2021-06-04 DIAGNOSIS — K56609 Unspecified intestinal obstruction, unspecified as to partial versus complete obstruction: Principal | ICD-10-CM

## 2021-06-08 ENCOUNTER — Ambulatory Visit: Admit: 2021-06-08 | Discharge: 2021-06-09 | Payer: MEDICARE

## 2021-06-08 ENCOUNTER — Encounter: Admit: 2021-06-08 | Discharge: 2021-06-08 | Payer: MEDICARE

## 2021-06-08 DIAGNOSIS — K56609 Unspecified intestinal obstruction, unspecified as to partial versus complete obstruction: Principal | ICD-10-CM

## 2021-06-15 ENCOUNTER — Encounter: Admit: 2021-06-15 | Discharge: 2021-06-15 | Payer: MEDICARE

## 2021-06-15 DIAGNOSIS — K56609 Unspecified intestinal obstruction, unspecified as to partial versus complete obstruction: Principal | ICD-10-CM

## 2021-06-18 ENCOUNTER — Encounter (INDEPENDENT_AMBULATORY_CARE_PROVIDER_SITE_OTHER): Payer: Self-pay

## 2021-06-18 DIAGNOSIS — G40209 Localization-related (focal) (partial) symptomatic epilepsy and epileptic syndromes with complex partial seizures, not intractable, without status epilepticus: Secondary | ICD-10-CM

## 2021-06-18 DIAGNOSIS — G40309 Generalized idiopathic epilepsy and epileptic syndromes, not intractable, without status epilepticus: Secondary | ICD-10-CM

## 2021-06-18 MED ORDER — FELBAMATE 600 MG/5ML PO SUSP: ORAL | 5 refills | Status: DC

## 2021-06-18 NOTE — Telephone Encounter (Signed)
Prescription increased to 8 mL twice daily because of recurrent seizures.  MyChart note sent to mother.

## 2021-06-22 ENCOUNTER — Encounter: Admit: 2021-06-22 | Discharge: 2021-06-22 | Payer: MEDICARE

## 2021-06-22 DIAGNOSIS — K56609 Unspecified intestinal obstruction, unspecified as to partial versus complete obstruction: Principal | ICD-10-CM

## 2021-06-26 ENCOUNTER — Ambulatory Visit: Admit: 2021-06-26 | Discharge: 2021-06-26 | Payer: MEDICARE

## 2021-06-26 ENCOUNTER — Encounter
Admit: 2021-06-26 | Discharge: 2021-06-26 | Payer: MEDICARE | Attending: Certified Registered" | Primary: Certified Registered"

## 2021-06-26 ENCOUNTER — Institutional Professional Consult (permissible substitution): Admit: 2021-06-26 | Discharge: 2021-06-27 | Payer: MEDICARE

## 2021-06-29 ENCOUNTER — Encounter: Admit: 2021-06-29 | Discharge: 2021-06-29 | Payer: MEDICARE

## 2021-06-29 DIAGNOSIS — K56609 Unspecified intestinal obstruction, unspecified as to partial versus complete obstruction: Principal | ICD-10-CM

## 2021-06-29 DIAGNOSIS — K580 Irritable bowel syndrome with diarrhea: Principal | ICD-10-CM

## 2021-07-06 ENCOUNTER — Encounter: Admit: 2021-07-06 | Discharge: 2021-07-06 | Payer: MEDICARE

## 2021-07-06 DIAGNOSIS — K56609 Unspecified intestinal obstruction, unspecified as to partial versus complete obstruction: Principal | ICD-10-CM

## 2021-07-10 ENCOUNTER — Telehealth: Admit: 2021-07-10 | Discharge: 2021-07-11 | Payer: MEDICARE

## 2021-07-10 DIAGNOSIS — K220 Achalasia of cardia: Principal | ICD-10-CM

## 2021-07-10 DIAGNOSIS — R1310 Dysphagia, unspecified: Principal | ICD-10-CM

## 2021-07-10 DIAGNOSIS — K219 Gastro-esophageal reflux disease without esophagitis: Principal | ICD-10-CM

## 2021-07-10 IMAGING — DX DG ABDOMEN 1V
2 series · 2 of 2 positions shown · non-contrast
Comparison: 08/26/2020

CLINICAL DATA: Poor p.o. intake.  History of constipation

EXAM:
ABDOMEN - 1 VIEW

[abdomen supine (1 of 2)]
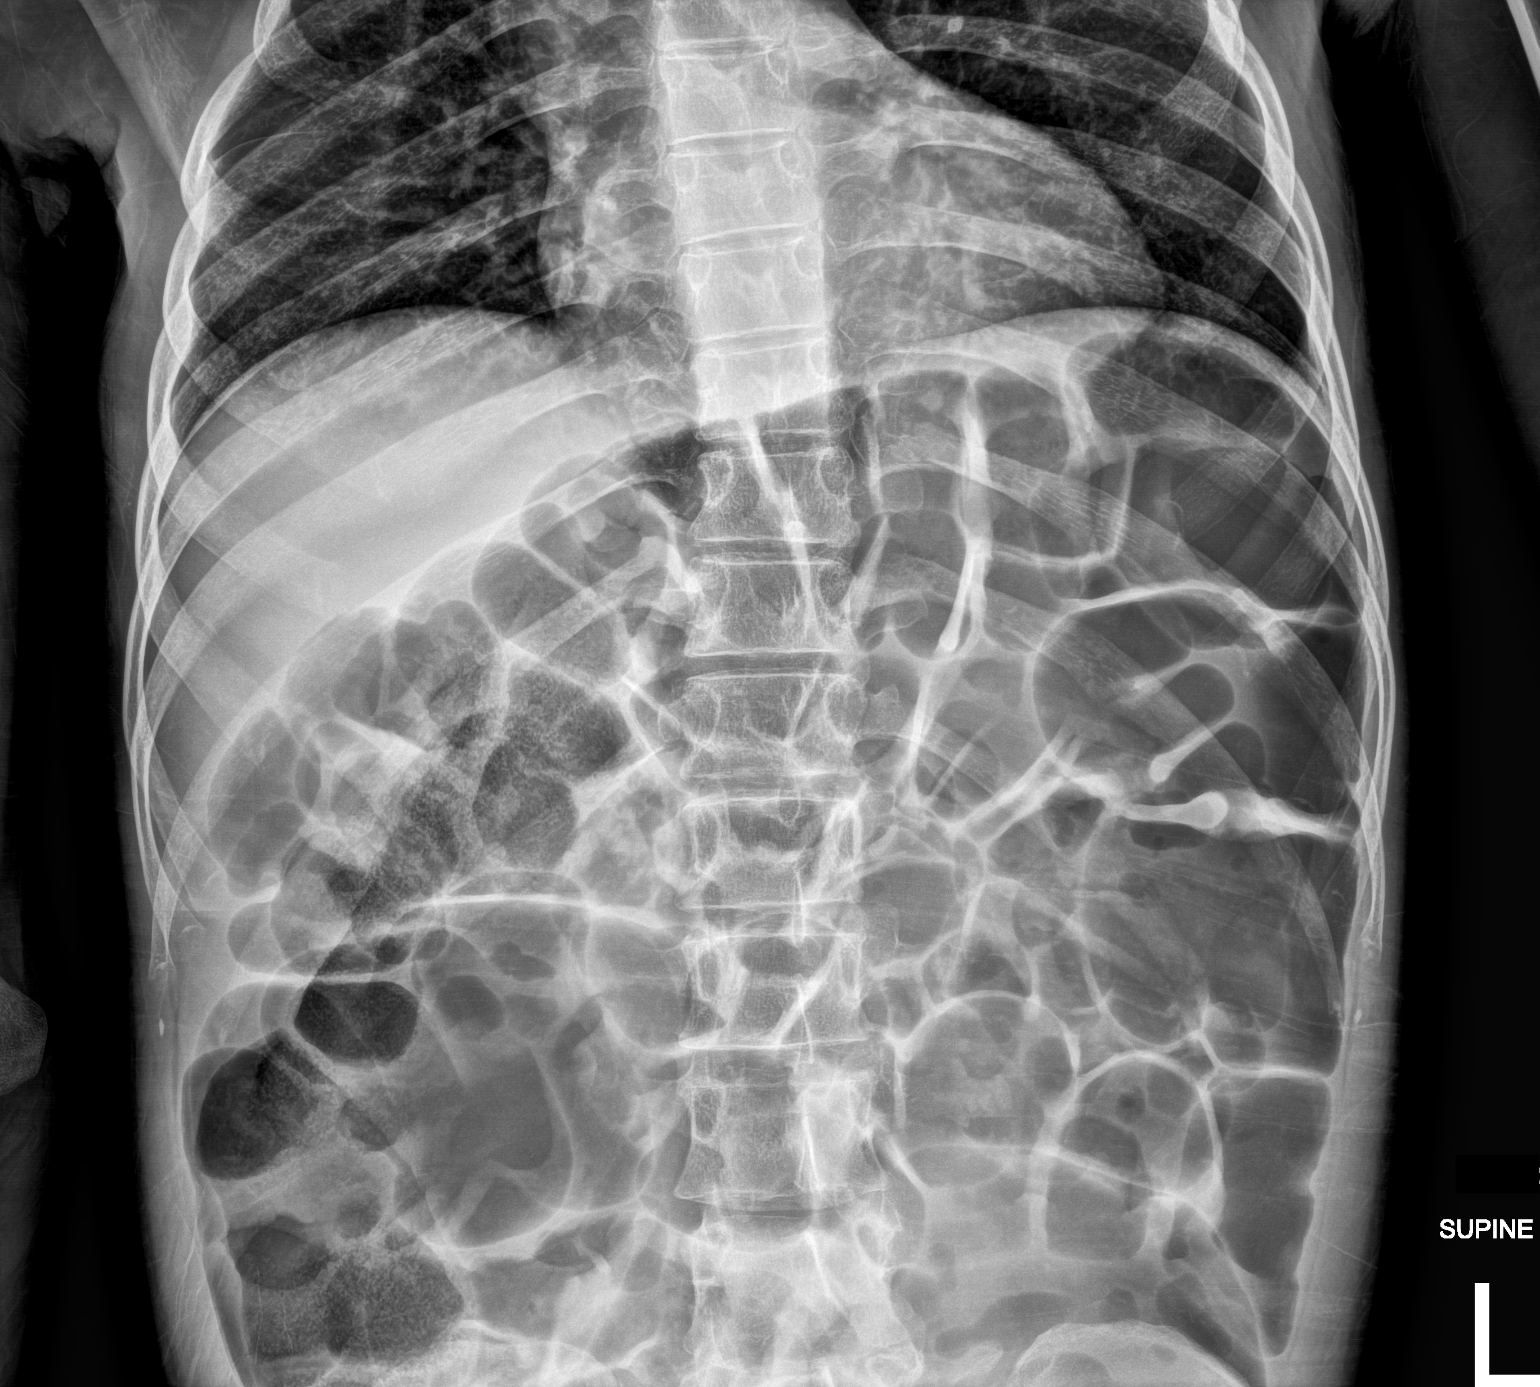

[abdomen supine (2 of 2)]
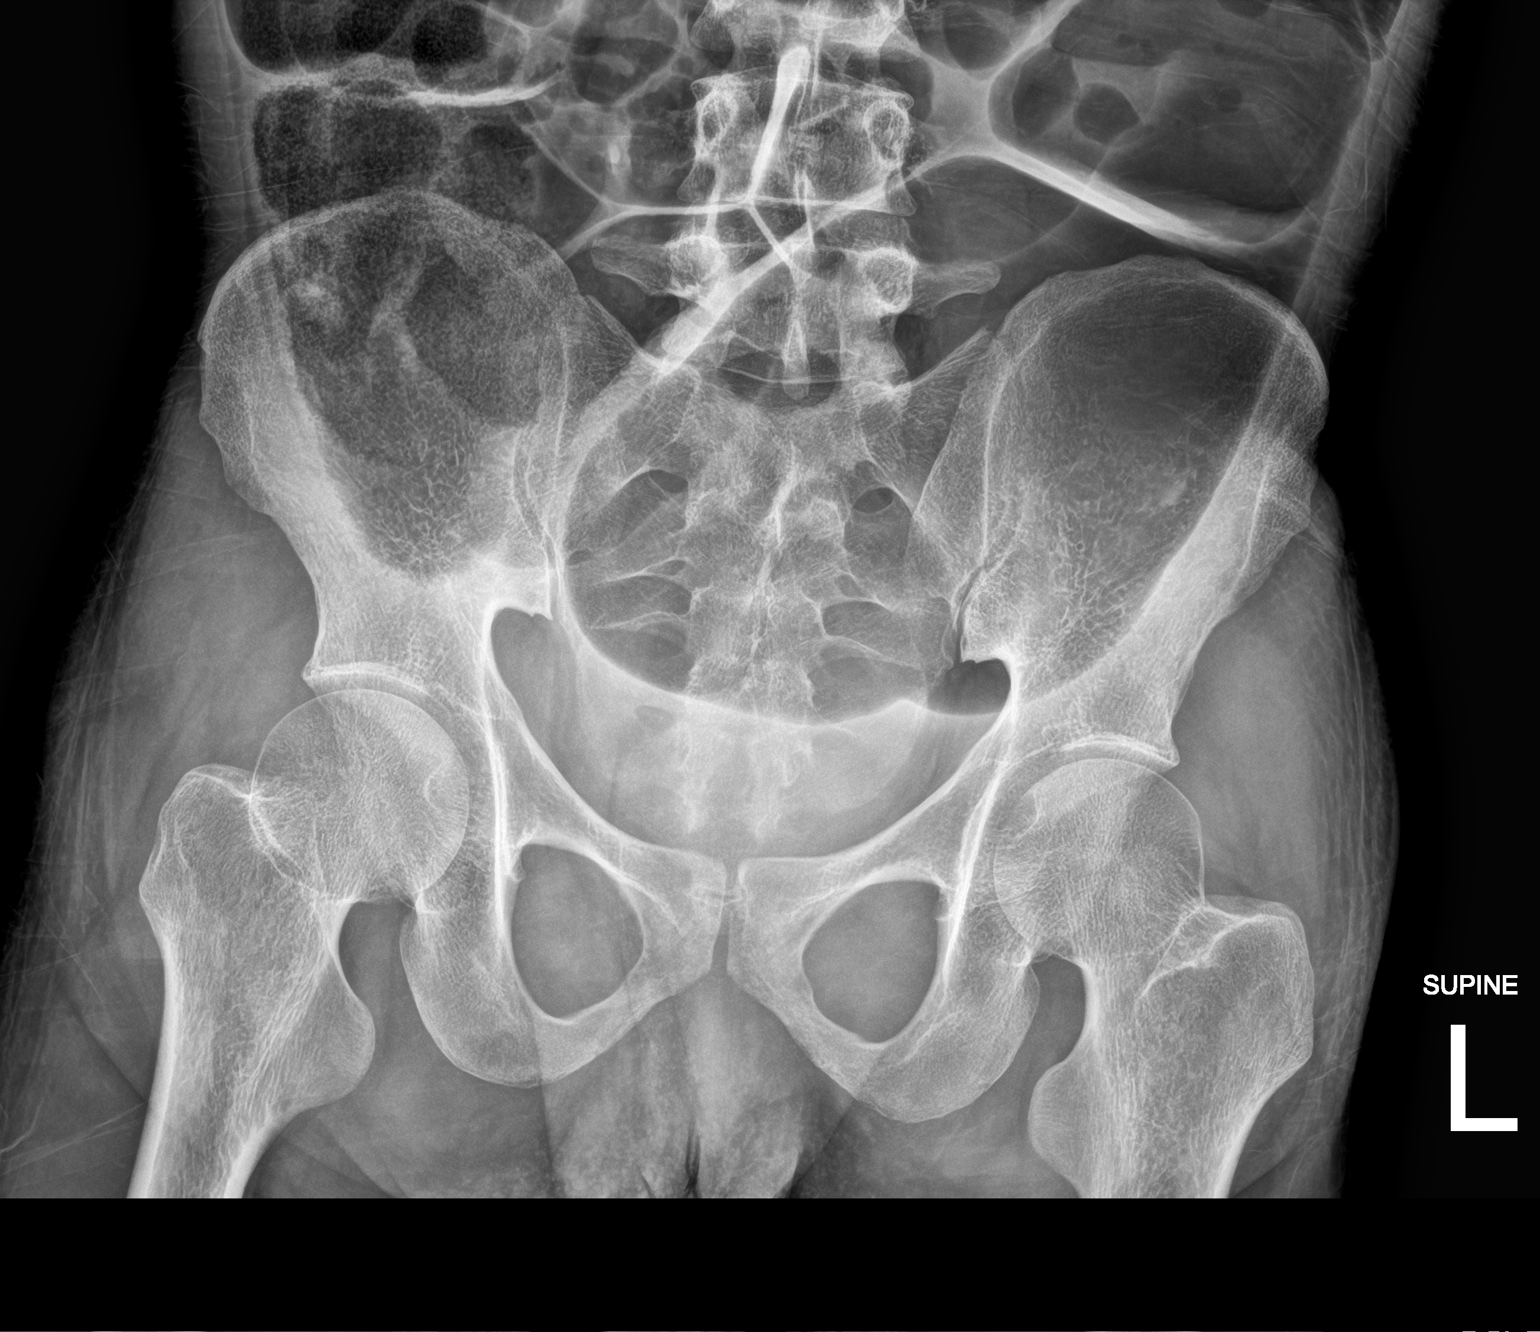

[2 of 2 positions shown; findings below may reference images not displayed]

FINDINGS: Gaseous distention of large in small bowel diffusely. Small amount
of stool in the colon. No free air. Markedly dilated sigmoid colon
as noted on prior CT 08/21/2020

Normal skeletal structures
IMPRESSION: Moderate distention of large and small bowel loops diffusely
extending to the sigmoid colon. Findings compatible with ileus

## 2021-07-13 ENCOUNTER — Encounter: Admit: 2021-07-13 | Discharge: 2021-07-13 | Payer: MEDICARE

## 2021-07-13 DIAGNOSIS — K56609 Unspecified intestinal obstruction, unspecified as to partial versus complete obstruction: Principal | ICD-10-CM

## 2021-07-20 ENCOUNTER — Ambulatory Visit: Admit: 2021-07-20 | Discharge: 2021-07-24 | Payer: MEDICARE

## 2021-07-20 ENCOUNTER — Encounter
Admit: 2021-07-20 | Discharge: 2021-07-24 | Disposition: A | Discharge status: Home / Self Care | Payer: MEDICARE | Source: Ambulatory Visit

## 2021-07-20 ENCOUNTER — Ambulatory Visit
Admit: 2021-07-20 | Discharge: 2021-07-24 | Disposition: A | Discharge status: Home / Self Care | Payer: MEDICARE | Source: Ambulatory Visit

## 2021-07-20 ENCOUNTER — Ambulatory Visit: Admit: 2021-07-20 | Discharge: 2021-07-21 | Payer: MEDICARE

## 2021-07-20 DIAGNOSIS — R627 Adult failure to thrive: Principal | ICD-10-CM

## 2021-07-27 ENCOUNTER — Encounter: Admit: 2021-07-27 | Discharge: 2021-07-27 | Payer: MEDICARE

## 2021-07-27 DIAGNOSIS — K56609 Unspecified intestinal obstruction, unspecified as to partial versus complete obstruction: Principal | ICD-10-CM

## 2021-07-27 DIAGNOSIS — K580 Irritable bowel syndrome with diarrhea: Principal | ICD-10-CM

## 2021-07-29 NOTE — Unmapped (Signed)
Pt pre-called and triaged with patients mother.

## 2021-07-30 ENCOUNTER — Telehealth: Admit: 2021-07-30 | Discharge: 2021-07-31 | Payer: MEDICARE

## 2021-07-30 DIAGNOSIS — R627 Adult failure to thrive: Principal | ICD-10-CM

## 2021-08-03 ENCOUNTER — Encounter: Admit: 2021-08-03 | Discharge: 2021-08-03 | Payer: MEDICARE

## 2021-08-03 DIAGNOSIS — K56609 Unspecified intestinal obstruction, unspecified as to partial versus complete obstruction: Principal | ICD-10-CM

## 2021-08-10 ENCOUNTER — Encounter: Admit: 2021-08-10 | Discharge: 2021-08-10 | Payer: MEDICARE

## 2021-08-10 DIAGNOSIS — K56609 Unspecified intestinal obstruction, unspecified as to partial versus complete obstruction: Principal | ICD-10-CM

## 2021-08-17 ENCOUNTER — Encounter: Admit: 2021-08-17 | Discharge: 2021-08-17 | Payer: MEDICARE

## 2021-08-17 DIAGNOSIS — K56699 Other intestinal obstruction unspecified as to partial versus complete obstruction: Principal | ICD-10-CM

## 2021-08-18 DIAGNOSIS — R7401 Transaminitis: Principal | ICD-10-CM

## 2021-08-22 ENCOUNTER — Ambulatory Visit: Admit: 2021-08-22 | Discharge: 2021-08-23 | Disposition: A | Discharge status: Home / Self Care | Payer: MEDICARE

## 2021-08-22 DIAGNOSIS — R3989 Other symptoms and signs involving the genitourinary system: Principal | ICD-10-CM

## 2021-08-22 DIAGNOSIS — R34 Anuria and oliguria: Principal | ICD-10-CM

## 2021-08-24 ENCOUNTER — Encounter: Admit: 2021-08-24 | Discharge: 2021-08-24 | Payer: MEDICARE

## 2021-08-24 DIAGNOSIS — K56609 Unspecified intestinal obstruction, unspecified as to partial versus complete obstruction: Principal | ICD-10-CM

## 2021-08-27 ENCOUNTER — Encounter: Admit: 2021-08-27 | Discharge: 2021-08-27 | Payer: MEDICARE

## 2021-08-27 DIAGNOSIS — K56609 Unspecified intestinal obstruction, unspecified as to partial versus complete obstruction: Principal | ICD-10-CM

## 2021-08-31 ENCOUNTER — Encounter: Admit: 2021-08-31 | Discharge: 2021-08-31 | Payer: MEDICARE

## 2021-08-31 DIAGNOSIS — K56609 Unspecified intestinal obstruction, unspecified as to partial versus complete obstruction: Principal | ICD-10-CM

## 2021-09-02 NOTE — Progress Notes (Signed)
Jonathan Kirby: Jonathan Kirby MRN: [id number] Sex: male DOB: 1986-02-12  Provider: Lorenz Coaster, MD Location of Care: [facility]  Note type: New Jonathan Kirby  History of Present Illness: Referral Source:William Hickling, MD  History from: Jonathan Kirby and prior records Chief Complaint: Sleep Difficulty and Starring Episodes   Jonathan Kirby is a 35 y.o. male with history of autism and focal epilepsy  who I am seeing by the request of Dr. Sharene Skeans for continued care on concern of his behavior and seizures . Review of prior history shows Jonathan Kirby was last seen by his Dr. Sharene Skeans on 05/08/21 where Felbatol was continued. Since then mom called on 06/18/21 to report seizures and Felbatol was increased to 8 mL BID. He as also seen in the ED on 05/09/21 for covid, on 05/28/21 for occluded PICC line, as well as on 08/22/21 for dehydration. He was admitted on 8/29 and 10/31 for failure to thrive for which he has started TPN   Jonathan Kirby presents today with parents.  They report they have decreased back to 7.5 mL BID of Felbatol. After increasing to 8 mL he had trouble sleeping. Since then, he has not had any grand mal seizures, but has had some staring for 20-30 seconds that they are unsure if they are seizures, 1-2 times a week.   Sleep: Having trouble sleeping through the night, this can cause increased agitation. Mom reports hesitant to give this as it can give her headaches. Benadryl worked for sleep in hospital. Goes to bed between 9:30-10am. Before TPN, wakes up at 3:30 in the morning briefly and then wakes up around 6. Since starting this medication, falls asleep at usual times, but then gets up more often, does not go back to sleep for a long time, have to wake him up at 8 am for TPN.   Has planned meeting to talk about removing TPN and possibly starting g-tube. Has nutritionist and GI doctor.   Food: No liquids PO. Will not eat much PO in general. Started TPN which has helped with  weight, but weight still fluctuates when he has flare ups with swallowing difficulty and he will eat nothing. Had swallowing difficulty previously, for which he has Botox and ileostomy. Mom feels that this made GERD worse. Will spit up often which will stop him eating for several days. Taking medication can be hard. No liquids.   Mood: Does not like strangers in general. Trips to the hospital have helped. Worse when he does not feel well. Haldol helped really well in the past, but after event that caused tachycardia mom does not like to give it. Have tried ativan, which increases ativan, use it rarely, mostly wait out his mood.   Tiggers: some words, new people, touching.    He will repeat words he hears. Mostly communicates through pointing. Loves music. Stays home during the day. Tried programs but did not go well. School never went well.  Care Management: Nurse comes out 1x weekly, checks labs and changes PICC line dressing, and helps with TPN. Parents dont' need help right now, will need assistance as they get older. Parents report [facility] is no longer seeing him, and wonder if there is a PCP we recommend.   Jonathan Kirby History: He had a mixture of complex partial seizures with secondary generalization, and myoclonic seizures.  These were quite frequent, and caused significant impairment in his function.  Once we were able to bring seizures under control Felbatol, he showed  a great deal of anxiety and depression.  This has improved as he has become older.  For the most part his seizures have been well controlled on Felbatol.  Most recurrent seizures have occurred in the setting of forgotten doses of medication.  He also has problems with constipation.  Diagnostics:  EEG 08/28/20 Impression:  This study is consistent with Jonathan Kirby's known history of epilepsy, which could be fragments of generalized epilepsy or multifocal epilepsy.  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  Past Medical History Past Medical History:  Diagnosis Date   Autism    GERD (gastroesophageal reflux disease)    Seizures (HCC)     Surgical History Past Surgical History:  Procedure Laterality Date   CIRCUMCISION  1987   ESOPHAGOGASTRODUODENOSCOPY N/A 04/17/2020   Procedure: ESOPHAGOGASTRODUODENOSCOPY (EGD);  Surgeon: Pasty Spillers, MD;  Location[facility] ENDOSCOPY;  Service: Endoscopy;  Laterality: N/A;   ESOPHAGOGASTRODUODENOSCOPY (EGD) WITH PROPOFOL N/A 08/28/2020   Procedure: ESOPHAGOGASTRODUODENOSCOPY (EGD) WITH PROPOFOL;  Surgeon: Toney Reil, MD;  Location: [facility] ENDOSCOPY;  Service: Gastroenterology;  Laterality: N/A;    Family History family history includes Alzheimer's disease in his maternal grandfather; Cancer in his maternal grandmother; Stroke in his paternal grandfather.   Social History Social History   Social History Narrative   Jonathan Kirby does not attend any school or day program at this time.    He lives with his parents.    He enjoys listening to music, playing with remote control cars, and collecting newspapers.    Allergies No Known Allergies  Medications Current Outpatient Medications on File Prior to Visit  Medication Sig Dispense Refill   LORazepam (ATIVAN) 2 MG/ML concentrated solution      pantoprazole sodium (PROTONIX) 40 mg Take by mouth.     Parenteral Electrolytes (TPN ELECTROLYTES IV)      simethicone (MYLICON) 40 MG/0.6ML drops      No current facility-administered medications on file prior to visit.   The medication list was reviewed and reconciled. All changes or newly prescribed medications were explained.  A complete medication list was provided to the Jonathan Kirby/caregiver.  Physical Exam BP 122/84    Pulse 96    Wt 113 lb (51.3 kg)    BMI 18.80 kg/m  Facility age limit for growth percentiles is 20 years.  No results found. Gen: well appearing  man Skin: No rash, No neurocutaneous stigmata. HEENT: Normocephalic, no dysmorphic features, no conjunctival injection, nares patent, mucous membranes moist, oropharynx clear. Neck: Supple, no meningismus. No focal tenderness. Resp: Clear to auscultation bilaterally CV: Regular rate, normal S1/S2, no murmurs, no rubs Abd: BS present, abdomen soft, non-tender, non-distended. No hepatosplenomegaly or mass Ext: Warm and well-perfused. No deformities, no muscle wasting, ROM full.  Neurological Examination: MS: Awake, alert, interactive. Poor eye contact, answers pointed questions with 1 word answers, speech was fluent.  Poor attention in room, mostly plays by herself. Cranial Nerves: Pupils were equal and reactive to light;  EOM normal, no nystagmus; no ptsosis, no double vision, intact facial sensation, face symmetric with full strength of facial muscles, hearing intact grossly.  Motor-Normal tone throughout, Normal strength in all muscle groups. No abnormal movements Reflexes- Reflexes 2+ and symmetric in the biceps, triceps, patellar and achilles tendon. Plantar responses flexor bilaterally, no clonus noted Sensation: Intact to light touch throughout.   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 male with  history of autism and focal epilepsy who presents for continued care. Increased dose of Felbatol caused sleep difficulty, recommend parents continue to give current dose, however, if he has another seizure I recommend increasing dose or additional medication to control these events. Staring episodes may be due to lack of sleep, I will try to manage these by addressing this, and if improved sleep resolves issue, rule out seizure, however, if the events continue, recommend EEG to determine if they are seizure. For sleep, I recommended melatonin 1-2 hours before bedtime, to be given consistently for a week. If unsuccessful, started 25 mg Atarax family can use for sleep as well as agitation. Given difficulty taking liquid PO, provided capsule to improve ability for him to take it. Advised, if he is unable to take this medication or if it does not work I will try gabapentin. Jonathan Kirby also requires PCP, agreed to assist in facilitating that. Given medical complexity, Jonathan Kirby would benefit from our complex care clinic, for which the in-home intake visit has been scheduled.    Continued Felbatol  Recommended Melatonin,3-5 mg, consisently 1-2 hours before bed time Prescribed 25 mg Atarax capsules PRN for sleep and/or for agitation.   If fails atarax, will try Gabapentin.  I will reach out to a PCP in [facility], to see if they are willing to do home visits in [place]. If not, I will refer to either [place] or [place].  Put in a referral for complex care clinic.  Inetta Fermo to is scheduled for intake on 12/27.   I spent 56 minutes on day of service on this Jonathan Kirby including review of chart, discussion with Jonathan Kirby and family, discussion of screening results, coordination with other providers and management of orders and paperwork.     Return in about 1 month (around 10/08/2021).  I, Mayra Reel, scribed for and in the presence of Lorenz Coaster, MD at today's visit on 09/07/2021.   I, Lorenz Coaster MD MPH,  personally performed the services described in this documentation, as scribed by Mayra Reel in my presence on 09/07/21 and it is accurate, complete, and reviewed by me.   Lorenz Coaster MD MPH Neurology and Neurodevelopment [facility]  [address] [place], [place], [place] [address] Phone: [phone] Fax: [phone]

## 2021-09-07 ENCOUNTER — Other Ambulatory Visit: Payer: Self-pay

## 2021-09-07 ENCOUNTER — Ambulatory Visit (INDEPENDENT_AMBULATORY_CARE_PROVIDER_SITE_OTHER): Payer: Medicare Other | Admitting: Pediatrics

## 2021-09-07 ENCOUNTER — Encounter (INDEPENDENT_AMBULATORY_CARE_PROVIDER_SITE_OTHER): Payer: Self-pay | Admitting: Pediatrics

## 2021-09-07 ENCOUNTER — Encounter: Admit: 2021-09-07 | Discharge: 2021-09-07 | Payer: MEDICARE

## 2021-09-07 VITALS — BP 122/84 | HR 96 | Wt 113.0 lb

## 2021-09-07 DIAGNOSIS — F840 Autistic disorder: Secondary | ICD-10-CM

## 2021-09-07 DIAGNOSIS — G40209 Localization-related (focal) (partial) symptomatic epilepsy and epileptic syndromes with complex partial seizures, not intractable, without status epilepticus: Secondary | ICD-10-CM | POA: Diagnosis not present

## 2021-09-07 DIAGNOSIS — G4700 Insomnia, unspecified: Secondary | ICD-10-CM

## 2021-09-07 DIAGNOSIS — R1310 Dysphagia, unspecified: Secondary | ICD-10-CM

## 2021-09-07 DIAGNOSIS — F6381 Intermittent explosive disorder: Secondary | ICD-10-CM

## 2021-09-07 DIAGNOSIS — K567 Ileus, unspecified: Secondary | ICD-10-CM

## 2021-09-07 DIAGNOSIS — E440 Moderate protein-calorie malnutrition: Secondary | ICD-10-CM

## 2021-09-07 DIAGNOSIS — G40309 Generalized idiopathic epilepsy and epileptic syndromes, not intractable, without status epilepticus: Secondary | ICD-10-CM

## 2021-09-07 DIAGNOSIS — K580 Irritable bowel syndrome with diarrhea: Principal | ICD-10-CM

## 2021-09-07 MED ORDER — FELBAMATE 600 MG/5ML PO SUSP: ORAL | 5 refills | Status: DC

## 2021-09-07 MED ORDER — HYDROXYZINE PAMOATE 100 MG PO CAPS: ORAL_CAPSULE | ORAL | 0 refills | Status: DC

## 2021-09-07 NOTE — Patient Instructions (Addendum)
Try Melatonin, I recommend 3-5 mg, 1-2 hours before bed time. Give this consistently for a week.  Prescribed 25 mg Atarax capsules that you can use for sleep and/or for agitation.  I have prescribed a capsule to improve ability for him to take it.   If this does not work, let me know and we can try Gabapentin.  Continued Felbatol.  I have prescribed 76ml twice daily, but it is ok to continue the 7.5ml twice daily for now.  I will reach out to a PCP in [facility], to see if they are willing to do home visits in [place]. If not, we have other options in [place] or [place].  I will put in a referral for complex care clinic.  We will work on this while getting you a PCP.   It was a pleasure to see you in clinic today.    Feel free to contact our office during normal business hours at [phone] with questions or concerns. If there is no answer or the call is outside business hours, please leave a message and our clinic staff will call you back within the next business day.  If you have an urgent concern, please stay on the line for our after-hours answering service and ask for the on-call neurologist.    I also encourage you to use MyChart to communicate with me more directly. If you have not yet signed up for MyChart within [facility], the front desk staff can help you. However, please note that this inbox is NOT monitored on nights or weekends, and response can take up to 2 business days.  Urgent matters should be discussed with the on-call pediatric neurologist.   At [facility], we are committed to providing exceptional care. You will receive a patient satisfaction survey through text or email regarding your visit today. Your opinion is important to me. Comments are appreciated.

## 2021-09-11 ENCOUNTER — Telehealth: Admit: 2021-09-11 | Discharge: 2021-09-12 | Payer: MEDICARE

## 2021-09-11 DIAGNOSIS — Z9889 Other specified postprocedural states: Principal | ICD-10-CM

## 2021-09-11 DIAGNOSIS — R7401 Transaminitis: Principal | ICD-10-CM

## 2021-09-11 DIAGNOSIS — K220 Achalasia of cardia: Principal | ICD-10-CM

## 2021-09-11 DIAGNOSIS — R627 Adult failure to thrive: Principal | ICD-10-CM

## 2021-09-14 ENCOUNTER — Encounter: Admit: 2021-09-14 | Discharge: 2021-09-14 | Payer: MEDICARE

## 2021-09-14 DIAGNOSIS — K56609 Unspecified intestinal obstruction, unspecified as to partial versus complete obstruction: Principal | ICD-10-CM

## 2021-09-15 ENCOUNTER — Other Ambulatory Visit: Payer: Self-pay

## 2021-09-15 ENCOUNTER — Other Ambulatory Visit: Payer: Medicare Other | Admitting: Family

## 2021-09-15 DIAGNOSIS — F840 Autistic disorder: Secondary | ICD-10-CM

## 2021-09-15 DIAGNOSIS — G40209 Localization-related (focal) (partial) symptomatic epilepsy and epileptic syndromes with complex partial seizures, not intractable, without status epilepticus: Secondary | ICD-10-CM

## 2021-09-15 DIAGNOSIS — R638 Other symptoms and signs concerning food and fluid intake: Secondary | ICD-10-CM

## 2021-09-15 DIAGNOSIS — G40309 Generalized idiopathic epilepsy and epileptic syndromes, not intractable, without status epilepticus: Secondary | ICD-10-CM | POA: Diagnosis not present

## 2021-09-15 DIAGNOSIS — F71 Moderate intellectual disabilities: Secondary | ICD-10-CM

## 2021-09-15 DIAGNOSIS — Z932 Ileostomy status: Secondary | ICD-10-CM

## 2021-09-15 DIAGNOSIS — G4700 Insomnia, unspecified: Secondary | ICD-10-CM

## 2021-09-15 NOTE — Patient Instructions (Addendum)
Thank you for allowing me to see [name] in your home today. He will be enrolled in the [facility] Pediatric Complex Care program.   I will verify the Atarax/Hydroxyzine dose and follow up with you about that.  Continue giving the Melatonin 1mg  for now.  Let me know when [name] is schedule to be admitted to [facility] to have the feeding tube placed. I will plan to return to see him after he returns home from that.  I will refer [name] to Caryn Bee, RD, our dietician with the Complex Care program.    Feel free to contact our office during normal business hours at [phone] with questions or concerns. If there is no answer or the call is outside business hours, please leave a message and our clinic staff will call you back within the next business day.  If you have an urgent concern, please stay on the line for our after-hours answering service and ask for the on-call neurologist.     I also encourage you to use MyChart to communicate with me more directly. If you have not yet signed up for MyChart within [facility], the front desk staff can help you. However, please note that this inbox is NOT monitored on nights or weekends, and response can take up to 2 business days.  Urgent matters should be discussed with the on-call pediatric neurologist.   At [facility], we are committed to providing exceptional care. You will receive a patient satisfaction survey through text or email regarding your visit today. Your opinion is important to me. Comments are appreciated.

## 2021-09-15 NOTE — Progress Notes (Signed)
CONAL SHETLEY   MRN:  [id number]  Jun 17, 1986   Provider: Elveria Rising NP-C Location of Care: [facility] Child Neurology  Visit type: Home visit for Complex Care intake  Last visit: 09/07/21 with Dr Artis Flock  Referral source: [name], MD History from: Epic chart, patient's parents  History:  History of autism spectrum disorder with intellectual disability and severe mixed language disorder. 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 He was formerly seen by Dr Ellison Carwin before his retirement earlier this year. 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 He has had problems with feeding intolerance and swallowing, and is currently receiving TPN at home via a PICC line.   Since his last visit with Dr Artis Flock, Caryn Bee was seen by GI at [facility] and a PEG tube was recommended for feeding as he has had elevated liver enzymes and ongoing problems with feeding. His parents tell me today that he will be scheduled for the procedure in January 2023.   Ishmail has problems with sleep and generally does not sleep all night. Mom has been giving him Melatonin and has found that helps him to sleep without grogginess the following day. Hydroxyzine was recommended at the last visit with Dr Artis Flock but Mom has not yet tried that because she had questions about the dosage and because she is concerned about potential side effects.   Kamaree has been otherwise generally healthy since he was last seen. His parents have no other health concerns for him today other than previously mentioned.  Review of systems: Please see HPI for neurologic and other pertinent review of systems. Otherwise all other systems were reviewed and were negative.  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   Abdominal pain 08/21/2020   Hypokalemia 08/21/2020   Hypomagnesemia 08/21/2020   Acute esophagitis    Seizure disorder (HCC)    Constipation 04/14/2020   Decreased oral intake 04/14/2020   Dysphagia 04/14/2020   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   Autism spectrum disorder with accompanying language impairment and intellectual disability, requiring very substantial support 07/23/2014   Generalized convulsive epilepsy (HCC) 04/02/2013   Partial epilepsy with impairment of consciousness (HCC) 04/02/2013   Moderate intellectual disabilities 04/02/2013   Insomnia, unspecified 04/02/2013   Intermittent explosive disorder 04/02/2013   Mental and behavioral problem 12-Sep-1986     Past Medical History:  Diagnosis Date   Autism    GERD (gastroesophageal reflux disease)    Seizures (HCC)     Past medical history comments: See HPI Copied from previous record: He had a mixture of complex partial seizures with secondary generalization, and myoclonic seizures.  These were quite frequent, and caused significant impairment in his function.  Once we were able to bring seizures under control Felbatol, he showed a great deal of anxiety and depression.  This has improved as he has become older.  For the most part his seizures have been well controlled on Felbatol.  Most recurrent seizures have occurred in the  setting of forgotten doses of medication.  He also has problems with constipation.   Behavior History autism spectrum disorder, level 3; he becomes aggressive when he is anxious and with changes in routine.  Surgical history: Past Surgical  History:  Procedure Laterality Date   CIRCUMCISION  1987   ESOPHAGOGASTRODUODENOSCOPY N/A 04/17/2020   Procedure: ESOPHAGOGASTRODUODENOSCOPY (EGD);  Surgeon: Pasty Spillers, MD;  Location: [facility] ENDOSCOPY;  Service: Endoscopy;  Laterality: N/A;   ESOPHAGOGASTRODUODENOSCOPY (EGD) WITH PROPOFOL N/A 08/28/2020   Procedure: ESOPHAGOGASTRODUODENOSCOPY (EGD) WITH PROPOFOL;  Surgeon: Toney Reil, MD;  Location: [facility] ENDOSCOPY;  Service: Gastroenterology;  Laterality: N/A;     Family history: family history includes Alzheimer's disease in his maternal grandfather; Cancer in his maternal grandmother; Stroke in his paternal grandfather.   Social history: Social History   Socioeconomic History   Marital status: Single    Spouse name: Not on file   Number of children: Not on file   Years of education: Not on file   Highest education level: Not on file  Occupational History   Not on file  Tobacco Use   Smoking status: Never    Passive exposure: Never   Smokeless tobacco: Never  Substance and Sexual Activity   Alcohol use: No    Alcohol/week: 0.0 standard drinks   Drug use: No   Sexual activity: Never  Other Topics Concern   Not on file  Social History Narrative   Tevita does not attend any school or day program at this time.    He lives with his parents.    He enjoys listening to music, playing with remote control cars, and collecting newspapers.   Social Determinants of Health   Financial Resource Strain: Not on file  Food Insecurity: Not on file  Transportation Needs: Not on file  Physical Activity: Not on file  Stress: Not on file  Social Connections: Not on file  Intimate Partner Violence: Not on file      Past/failed meds: Copied from previous record: Phenobarbital, Tegretol, Dilantin, Depakote, Mysoline, Celontin  Allergies: No Known Allergies    Immunizations:  There is no immunization history on file for this patient.     Diagnostics/Screenings: Copied from previous record: 08/24/2020 - CT head wo contrast - 1.  No acute intracranial abnormality. 2. Nonspecific volume loss involving the cerebellum and possibly also the brainstem.  08/28/2020 - rEEG - This study is consistent with patient's known history of epilepsy, which could be fragments of generalized epilepsy  or multifocal epilepsy.  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  Charlsie Quest   Physical Exam: There were no vitals taken for this visit.  General: well developed, well nourished man, seated at home with his parents, in no evident distress Head: microcephalic and atraumatic. No dysmorphic features. Neck: supple Musculoskeletal: no skeletal deformities or obvious scoliosis. Has PICC line in place infusing TPN in left arm Skin: no rashes or neurocutaneous lesions  Neurologic Exam Mental Status: awake and fully alert. Has no language. Covered his face with his hands during entire visit. Cranial Nerves: turns to localize faces and objects in the periphery. Turns to localize sounds in the periphery. Facial movements are symmetric. Motor: normal functional bulk, tone and strength Sensory: withdrawal x 4 Coordination: unable to adequately assess due to patient's inability to participate in examination. No dysmetria when reaching for objects.  Impression: Autism spectrum disorder with accompanying language impairment and intellectual disability, requiring very  substantial support  Partial epilepsy with impairment of consciousness (HCC)  Generalized convulsive epilepsy (HCC)  Decreased oral intake  Moderate intellectual disabilities  Ileostomy in place [facility])  Insomnia, unspecified type - Plan: hydrOXYzine (VISTARIL) 25 MG capsule    Recommendations for plan of care: The patient's previous Epic records were reviewed. Reuben is a 35 year old man with  history of autism spectrum disorder with intellectual disability and severe mixed language disorder, epilepsy, recurrent small bowel obstruction s/p total abdominal colectomy and ileostomy. He has had problems with feeding intolerance and swallowing, and is currently receiving TPN at home via a PICC line. He will be receiving PEG tube placement for nourishment and medications in January 2023. Zeferino is seen at home today for intake for the [facility] Pediatric Complex Care program. I reviewed the program with his parents and answered questions. I will return to see Lonney at his home after his PEG tube placement and he will then return to the office at a later time to meet with the Complex Care team. Abraham's parents agreed with the plans made today.   The medication list was reviewed and reconciled. I verified with Dr Artis Flock that the Hydroxyzine dose should be 25mg  and updated his medication list for that. I reviewed the changes that were made in the prescribed medications today. A complete medication list was provided to the patient.   Allergies as of 09/15/2021   No Known Allergies      Medication List        Accurate as of September 15, 2021 11:59 PM. If you have any questions, ask your nurse or doctor.          felbamate 600 MG/5ML suspension Commonly known as: FELBATOL Take 8 mL twice daily   hydrOXYzine 25 MG capsule Commonly known as: VISTARIL Give 1 capsule at bedtime What changed:  medication strength additional instructions Changed by: September 17, 2021, NP   LORazepam 2 MG/ML concentrated solution Commonly known as: ATIVAN   pantoprazole sodium 40 mg Commonly known as: PROTONIX Take by mouth.   simethicone 40 MG/0.6ML drops Commonly known as: MYLICON   TPN ELECTROLYTES IV      Total time spent with the patient was 30 minutes, of which 50% or more was spent in counseling and coordination of care.  Elveria Rising NP-C [facility] Child Neurology Ph.  [phone] Fax [phone]

## 2021-09-17 ENCOUNTER — Encounter (INDEPENDENT_AMBULATORY_CARE_PROVIDER_SITE_OTHER): Payer: Self-pay | Admitting: Family

## 2021-09-17 DIAGNOSIS — Z932 Ileostomy status: Secondary | ICD-10-CM | POA: Insufficient documentation

## 2021-09-17 MED ORDER — HYDROXYZINE PAMOATE 25 MG PO CAPS: ORAL_CAPSULE | ORAL | 0 refills | Status: DC

## 2021-09-21 ENCOUNTER — Encounter: Admit: 2021-09-21 | Discharge: 2021-09-21 | Payer: MEDICARE

## 2021-09-21 DIAGNOSIS — K56609 Unspecified intestinal obstruction, unspecified as to partial versus complete obstruction: Principal | ICD-10-CM

## 2021-09-22 ENCOUNTER — Encounter (INDEPENDENT_AMBULATORY_CARE_PROVIDER_SITE_OTHER): Payer: Self-pay | Admitting: Pediatrics

## 2021-09-22 ENCOUNTER — Institutional Professional Consult (permissible substitution): Admit: 2021-09-22 | Discharge: 2021-09-23 | Payer: MEDICARE

## 2021-09-27 ENCOUNTER — Ambulatory Visit: Admit: 2021-09-27 | Discharge: 2021-10-02 | Payer: MEDICARE

## 2021-09-27 ENCOUNTER — Ambulatory Visit: Admit: 2021-09-27 | Discharge: 2021-10-02 | Disposition: A | Discharge status: Home / Self Care | Payer: MEDICARE

## 2021-10-02 MED ORDER — KETOROLAC 10 MG TABLET
ORAL_TABLET | 0 refills | 0 days | Status: CP
Start: 2021-10-02 — End: ?

## 2021-10-06 ENCOUNTER — Encounter: Admit: 2021-10-06 | Discharge: 2021-10-06 | Payer: MEDICARE | Attending: Vascular Surgery | Primary: Vascular Surgery

## 2021-10-06 DIAGNOSIS — K56609 Unspecified intestinal obstruction, unspecified as to partial versus complete obstruction: Principal | ICD-10-CM

## 2021-10-12 ENCOUNTER — Encounter (INDEPENDENT_AMBULATORY_CARE_PROVIDER_SITE_OTHER): Payer: Self-pay | Admitting: Pediatrics

## 2021-10-12 ENCOUNTER — Encounter: Admit: 2021-10-12 | Discharge: 2021-10-12 | Payer: MEDICARE

## 2021-10-12 DIAGNOSIS — K56609 Unspecified intestinal obstruction, unspecified as to partial versus complete obstruction: Principal | ICD-10-CM

## 2021-10-15 ENCOUNTER — Ambulatory Visit: Admit: 2021-10-15 | Payer: MEDICARE

## 2021-10-15 ENCOUNTER — Encounter: Admit: 2021-10-15 | Payer: MEDICARE

## 2021-10-15 ENCOUNTER — Ambulatory Visit: Admit: 2021-10-15 | Discharge: 2021-10-27 | Disposition: A | Discharge status: Home Health | Payer: MEDICARE

## 2021-10-15 ENCOUNTER — Encounter
Admit: 2021-10-15 | Discharge: 2021-10-27 | Disposition: A | Discharge status: Home Health | Payer: MEDICARE | Attending: Certified Registered"

## 2021-10-15 ENCOUNTER — Ambulatory Visit: Admit: 2021-10-15 | Discharge: 2021-10-27 | Payer: MEDICARE

## 2021-10-22 ENCOUNTER — Telehealth (INDEPENDENT_AMBULATORY_CARE_PROVIDER_SITE_OTHER): Payer: Self-pay | Admitting: Pediatrics

## 2021-10-22 NOTE — Telephone Encounter (Signed)
Who's calling (name and relationship to patient) : Dr. Caryn Bee neuro resident   Best contact number: [phone]  Provider they see: Dr. Rogers Blocker  Reason for call: Would like to speak with Dr. Rogers Blocker regarding patient and treatment plan  Call ID:      Perry  Name of prescription:  Pharmacy:

## 2021-10-26 NOTE — Telephone Encounter (Signed)
I returned call to resident.  Patient was having acolasia with difficulty swallowing, so switched to IV medication (Vimpat). Now has had increased LFTs, concern that Vimpat and/or Felbamate could make this worse. So switched to Briviact, started on Thursday, recommend discharge on this medication.    Of note, he has not had any seizures since he's been inpatient.  He was on subtherapeutic dose of Vimpat when it was first started and still no seizures.    I wonder if he needs medication at all.  I agree with him going home on Briviact and will discuss possible wean after next appointment.   Lorenz Coaster MD MPH

## 2021-10-27 MED ORDER — BRIVARACETAM 50 MG TABLET
ORAL_TABLET | Freq: Two times a day (BID) | ORAL | 1 refills | 30.00000 days | Status: CP
Start: 2021-10-27 — End: ?

## 2021-10-27 MED ORDER — PYRIDOXINE (VITAMIN B6) 100 MG TABLET
ORAL_TABLET | Freq: Every day | GASTROSTOMY | 1 refills | 30 days | Status: CP
Start: 2021-10-27 — End: ?

## 2021-10-27 NOTE — Telephone Encounter (Signed)
[facility] calling to discuss if our office will follow patient for at home orders with tube feeding. Please call pam at [phone] to discuss.

## 2021-10-28 ENCOUNTER — Telehealth (INDEPENDENT_AMBULATORY_CARE_PROVIDER_SITE_OTHER): Payer: Self-pay | Admitting: Pediatrics

## 2021-10-28 ENCOUNTER — Encounter: Admit: 2021-10-28 | Payer: MEDICARE

## 2021-10-28 ENCOUNTER — Inpatient Hospital Stay: Admit: 2021-10-28 | Payer: MEDICARE

## 2021-10-28 ENCOUNTER — Encounter: Admit: 2021-10-28 | Discharge: 2021-11-26 | Payer: MEDICARE

## 2021-10-28 NOTE — Telephone Encounter (Signed)
Spoke with mom and she informs that during his hospital stay he was on Vimpat injections. This caused his liver enzymes to increase significantly so he was changed to Briviact and was responding well. Yesterday on the way home from the seizure he had a seizure for a few seconds. Then at home last night he had another last for a few moments Staring and right hand twitching. This morning he has one for a few seconds right hand twitching and staring.  Very tired after all 3 seizures, but otherwise okay.  Mom is concerned if they continue on the felbatol that it will continue to increase his liver enzymes. Should they switch to briviact permanently?   72ml felbatol was given this morning as mom was concerned witht he amount of breakthrough seizures he was having.   He was in the hospital for as long as he did because he received a PEG tube 10/23/2021.    Currently his tube feedings are for all of his nutrients. A continuous feed over night  and 4 bolus feeds a per feed Osmolite 1.0   Briviact 50mg  BID was sent in to the patients pharmacy by the hospital and they are picking it up today.

## 2021-10-28 NOTE — Telephone Encounter (Signed)
°  Who's calling (name and relationship to patient) : Darlene  Best contact number: [phone] Provider they see: Artis Flock Reason for call: Durand was in the [facility] hospital from 10/15/21 to 10/27/21. Family is having issues with medication. Aloysius has had 3 break through seizures. Patient is hoping to have an appt soon however the schedule is currently completely booked.      PRESCRIPTION REFILL ONLY  Name of prescription:  Pharmacy:

## 2021-10-28 NOTE — Telephone Encounter (Signed)
I called and left a message. I will try to reach her again tomorrow. TG

## 2021-10-28 NOTE — Telephone Encounter (Signed)
I called and left a message for Mom that I will call back later. TG

## 2021-10-29 DIAGNOSIS — K220 Achalasia of cardia: Principal | ICD-10-CM

## 2021-10-29 DIAGNOSIS — R627 Adult failure to thrive: Principal | ICD-10-CM

## 2021-10-29 DIAGNOSIS — R7401 Transaminitis: Principal | ICD-10-CM

## 2021-10-29 NOTE — Telephone Encounter (Signed)
Patient discussed with Inetta Fermo, home visit scheduled.  I advise the patient continue on only Briviact.  Hospital reported no seizures during his hospitalization, so recommend evaluating the event mother thought were seizure as soon as he got home.    Lorenz Coaster MD MPH

## 2021-10-29 NOTE — Telephone Encounter (Signed)
I agreed to sign home health orders for him, but in looking at his discharge summary, it seems that he will be followed at [facility], which should handle his nutrition and likely also his PEG? Inetta Fermo, please confirm this with mom tomorrow, if she doesn't know we should call [facility] to confirm.   Lorenz Coaster MD MPH

## 2021-10-30 ENCOUNTER — Other Ambulatory Visit: Payer: Medicare Other | Admitting: Family

## 2021-10-30 ENCOUNTER — Encounter (INDEPENDENT_AMBULATORY_CARE_PROVIDER_SITE_OTHER): Payer: Self-pay

## 2021-10-30 ENCOUNTER — Other Ambulatory Visit: Payer: Self-pay

## 2021-10-30 DIAGNOSIS — G40209 Localization-related (focal) (partial) symptomatic epilepsy and epileptic syndromes with complex partial seizures, not intractable, without status epilepticus: Secondary | ICD-10-CM | POA: Diagnosis not present

## 2021-10-30 DIAGNOSIS — Z932 Ileostomy status: Secondary | ICD-10-CM

## 2021-10-30 DIAGNOSIS — G4700 Insomnia, unspecified: Secondary | ICD-10-CM

## 2021-10-30 DIAGNOSIS — F71 Moderate intellectual disabilities: Secondary | ICD-10-CM

## 2021-10-30 DIAGNOSIS — R1310 Dysphagia, unspecified: Secondary | ICD-10-CM

## 2021-10-30 DIAGNOSIS — F840 Autistic disorder: Secondary | ICD-10-CM

## 2021-10-30 DIAGNOSIS — G40909 Epilepsy, unspecified, not intractable, without status epilepticus: Secondary | ICD-10-CM

## 2021-10-30 DIAGNOSIS — Z931 Gastrostomy status: Secondary | ICD-10-CM

## 2021-10-30 DIAGNOSIS — R748 Abnormal levels of other serum enzymes: Secondary | ICD-10-CM

## 2021-10-30 DIAGNOSIS — R627 Adult failure to thrive: Principal | ICD-10-CM

## 2021-10-30 DIAGNOSIS — K220 Achalasia of cardia: Principal | ICD-10-CM

## 2021-10-30 NOTE — Progress Notes (Signed)
Jonathan Kirby   MRN:  [id number]  April 04, 1986   Provider: Rockwell Germany NP-C Location of Care: [facility] Child Neurology and Pediatric Complex Care  Visit type: Home visit  Last visit: 09/15/2021  Referral source: Jonathan Dyke, MD History from: Epic chart, patient's parents  Brief history:  Copied from previous record: History of autism spectrum disorder with intellectual disability and severe mixed language disorder. 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 He has had problems with feeding intolerance and swallowing, and received TPN at home via a PICC line for over a year.    Jonathan Kirby was seen by GI at [facility] and a PEG tube was recommended for feeding as he has had elevated liver enzymes and ongoing problems with feeding. He underwent the procedure and a liver biopsy procedure in late January 2023 and did well with that.    Jonathan Kirby has problems with sleep and generally does not sleep all night. Mom is trying low dose Melatonin but finds that Jonathan Kirby is groggy the following morning.    Today's concerns: Jonathan Kirby is seen today in home visit because of his difficulty with going out to appointments. He was discharged from [facility] 2 days ago after having surgery to receive a PEG tube and liver biopsy. He did well with those procedures and has tolerated PEG tube feedings. The TPN was discontinued and the PICC line removed. Mom reports that [facility] will be managing his feedings and nutrition.  Jonathan Kirby was switched to Gustine in the hospital because of concerns about his elevated liver enzymes. He briefly received Vimpat and Felbamate was discontinued.   Jonathan Kirby had 3 seizures after discharge and his mother was fearful that Briviact was not sufficient to control seizures. She restarted the Felbamate  as she felt that it had controlled seizures in the past. Mom reports that the seizures involved unresponsive staring, behavioral arrest with rigidity, and right hand shaking. These seizures lasted seconds but frightened his parents a great deal.   Mom reports that she is continuing to work on establishing care with a PCP for Jonathan Kirby. She is interested in [place] but needs a referral there. Mom says that she has a lab order to recheck liver enzymes next week.   Jonathan Kirby is being followed by [facility]. Mom is unsure how many visits he will receive from this agency.   Jonathan Kirby has been otherwise generally healthy since he was last seen. His parents have no other health concerns for him today other than previously mentioned.  Review of systems: Please see HPI for neurologic and other pertinent review of systems. Otherwise all other systems were reviewed and were negative.  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  These were quite frequent, and caused significant impairment in his function.  Once we were able to bring seizures under control Felbatol, he showed a great deal of anxiety and depression.  This has improved as he has become older.  For the most part his seizures have been well controlled on Felbatol.  Most recurrent seizures have occurred in the setting of forgotten doses of medication.  He also has problems with constipation.   Behavior History autism spectrum disorder, level 3; he becomes aggressive when he is anxious and with changes in routine.  Surgical history: Past Surgical History:  Procedure Laterality Date   CIRCUMCISION  1987   ESOPHAGOGASTRODUODENOSCOPY N/A 04/17/2020   Procedure: ESOPHAGOGASTRODUODENOSCOPY (EGD);  Surgeon: Virgel Manifold, MD;  Location: [facility] ENDOSCOPY;  Service: Endoscopy;  Laterality: N/A;   ESOPHAGOGASTRODUODENOSCOPY (EGD) WITH PROPOFOL N/A 08/28/2020   Procedure: ESOPHAGOGASTRODUODENOSCOPY (EGD) WITH PROPOFOL;  Surgeon: Lin Landsman, MD;  Location: [facility] ENDOSCOPY;  Service: Gastroenterology;  Laterality: N/A;     Family history: family history includes Alzheimer's disease in his maternal grandfather; Cancer in his maternal grandmother; Stroke in his paternal grandfather.   Social history: Social History   Socioeconomic History   Marital status: Single    Spouse name: Not on file   Number of children: Not on file   Years of education: Not on file   Highest education level:  Not on file  Occupational History   Not on file  Tobacco Use   Smoking status: Never    Passive exposure: Never   Smokeless tobacco: Never  Substance and Sexual Activity   Alcohol use: No    Alcohol/week: 0.0 standard drinks   Drug use: No   Sexual activity: Never  Other Topics Concern   Not on file  Social History Narrative   Tatum does not attend any school or day program at this time.    He lives with his parents.    He enjoys listening to music, playing with remote control cars, and collecting newspapers.   Social Determinants of Health   Financial Resource Strain: Not on file  Food Insecurity: Not on file  Transportation Needs: Not on file  Physical Activity: Not on file  Stress: Not on file  Social Connections: Not on file  Intimate Partner Violence: Not on file    Past/failed meds: Copied from previous record: Phenobarbital, Tegretol, Dilantin, Depakote, Mysoline, Celontin Vimpat - elevated liver enzymes Felbamate - elevated liver enzymes   Allergies: No Known Allergies   Immunizations:  There is no immunization history on file for this patient.   Diagnostics/Screenings: Copied from previous record: 08/24/2020 - CT head wo contrast - 1.  No acute intracranial abnormality. 2. Nonspecific volume loss involving the cerebellum and possibly also the brainstem.   08/28/2020 -  rEEG - This study is consistent with patient's known history of epilepsy, which could be fragments of generalized epilepsy  or multifocal epilepsy.  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  Lora Havens   Physical Exam: There were no vitals taken for this visit.  General: well developed, well nourished man, seated at home with his parents, in no evident distress Head: microcephalic and atraumatic. No dysmorphic features. Neck: supple Abdomen: PEG tube in place, taped securely. Ileostomy in place,  draining small amount green stool.  Musculoskeletal: no skeletal deformities or obvious scoliosis.  Skin: no rashes or neurocutaneous lesions. Has dry dressing in place where PICC line was removed at [facility].  Neurologic Exam Mental Status: awake and fully alert. Has minimal language but did say "bye" when I left today. Covered his face with his hands during the visit Cranial Nerves: turns to localize faces and objects in the periphery. Turns to localize sounds in the periphery. Facial movements are symmetric. Motor: normal functional bulk, tone and strength Sensory: withdrawal x 4 Coordination: unable to adequately assess due to patient's inability to participate in examination. No dysmetria when reaching for objects.  Impression: Seizure disorder ([place])  Partial epilepsy with impairment of consciousness ([place])  Ileostomy in place [facility])  Autism spectrum disorder with accompanying language impairment and intellectual disability, requiring very substantial support  Moderate intellectual disabilities  Insomnia, unspecified type  Elevated liver enzymes   Recommendations for plan of care: The patient's previous [facility] records were reviewed. Kolbie has neither had nor required imaging or lab studies since the last visit, other than what was performed at [facility] recently. I talked with his parents about his seizure medication, and gave them instructions on tapering Felbamate and titrating up on Briviact. I asked them to let me know if seizures occurred, and to video any seizures if possible. We talked about establishing with a PCP and I will send a referral to the [facility] Internal Medicine and Pediatric [facility]. I encouraged his parents to get the blood test done to check his liver enzymes next week as scheduled. I will return to see Jonathan Kirby in 1 month or sooner if needed. His parents agreed with the plans made today.   The medication list was reviewed and reconciled. No changes were made in the prescribed  medications today. A complete medication list was provided to the patient.  Orders Placed This Encounter  Procedures   Ambulatory referral to Internal Medicine    Referral Priority:   Routine    Referral Type:   Consultation    Referral Reason:   Specialty Services Required    Requested Specialty:   Internal Medicine    Number of Visits Requested:   1     Allergies as of 10/30/2021   No Known Allergies      Medication List        Accurate as of October 30, 2021 11:59 PM. If you have any questions, ask your nurse or doctor.          STOP taking these medications    TPN ELECTROLYTES IV Stopped by: Jonathan Germany, NP       TAKE these medications    Briviact 50 MG Tabs Generic drug: Brivaracetam Take 2 tablets by mouth 2 (two) times daily. What changed: how much to take Changed by: Jonathan Germany, NP   felbamate 600 MG/5ML suspension Commonly known as: FELBATOL Take 8 mL twice daily   hydrOXYzine 25  MG capsule Commonly known as: VISTARIL Give 1 capsule at bedtime   LORazepam 2 MG/ML concentrated solution Commonly known as: ATIVAN   melatonin 1 MG Tabs tablet Take by mouth.   pantoprazole sodium 40 mg Commonly known as: PROTONIX Take by mouth.   simethicone 40 MG/0.6ML drops Commonly known as: MYLICON      Total time spent with the patient was 40 minutes, of which 50% or more was spent in counseling and coordination of care.  Jonathan Germany NP-C [facility] Ph. [phone] Fax [phone]

## 2021-10-30 NOTE — Telephone Encounter (Signed)
I put in the referral to [facility] Internal Medicine as we have referred all of our other adult Complex Care patients there. TG

## 2021-10-30 NOTE — Patient Instructions (Addendum)
Thank you for allowing me to see Jonathan Kirby in your home today.   For a PCP - Jonathan Artis Flock has suggested the following: [facility] - has been referred [facility] -Internal Medicine and Pediatrics clinic, but they do not do home visits. You can look them up at this website: [email or web address] I will send a referral to the [facility] Internal Medicine and Pediatric clinics Please let me know which PCP you choose.   For seizures:  Restart Briviact at 50mg  twice per day.  When you start the Briviact, start reducing the Felbamate as follows: Give 42ml twice per day for 2 days, then Give 64ml twice per day for 2 days, then Give 63ml twice per day for 2 days, then stop the medication.  When you get to Felbamate 12ml twice per day, you can increase the Briviact to 100mg  (2 tablets) twice per day.  Please keep track of the seizures and let me know if they occur. If you can, try to video any seizure events so we can see what you are seeing  For the elevated liver enzymes: Be sure to get the liver enzymes rechecked as recommended  For the PEG tube and feedings: Continue the care instructions and feeding plan given to you by [facility].  Follow up with [facility] as scheduled  I will return to see Jonathan Kirby in 1 month on Friday March 10th at 12n Please plan to return to see Jonathan Kirby on December 24, 2021 @ 2:30PM  Let me know if you have any questions.    At [facility], we are committed to providing exceptional care. You will receive a patient satisfaction survey through text or email regarding your visit today. Your opinion is important to me. Comments are appreciated.

## 2021-11-01 ENCOUNTER — Encounter (INDEPENDENT_AMBULATORY_CARE_PROVIDER_SITE_OTHER): Payer: Self-pay | Admitting: Family

## 2021-11-01 DIAGNOSIS — R748 Abnormal levels of other serum enzymes: Secondary | ICD-10-CM | POA: Insufficient documentation

## 2021-11-01 MED ORDER — BRIVIACT 50 MG PO TABS: 2.0000 | ORAL_TABLET | Freq: Two times a day (BID) | ORAL | 5 refills | Status: DC

## 2021-11-02 ENCOUNTER — Ambulatory Visit: Admit: 2021-11-02 | Discharge: 2021-11-03 | Payer: MEDICARE

## 2021-11-02 DIAGNOSIS — R627 Adult failure to thrive: Principal | ICD-10-CM

## 2021-11-02 DIAGNOSIS — K220 Achalasia of cardia: Principal | ICD-10-CM

## 2021-11-02 NOTE — Telephone Encounter (Signed)
In home visit last week, Mom reports that [facility] GI will follow nutrition and PEG care. TG

## 2021-11-03 DIAGNOSIS — K220 Achalasia of cardia: Principal | ICD-10-CM

## 2021-11-03 DIAGNOSIS — R627 Adult failure to thrive: Principal | ICD-10-CM

## 2021-11-04 DIAGNOSIS — K220 Achalasia of cardia: Principal | ICD-10-CM

## 2021-11-04 DIAGNOSIS — R627 Adult failure to thrive: Principal | ICD-10-CM

## 2021-11-09 ENCOUNTER — Ambulatory Visit (INDEPENDENT_AMBULATORY_CARE_PROVIDER_SITE_OTHER): Payer: Medicare Other | Admitting: Pediatrics

## 2021-11-09 DIAGNOSIS — R627 Adult failure to thrive: Principal | ICD-10-CM

## 2021-11-09 DIAGNOSIS — K220 Achalasia of cardia: Principal | ICD-10-CM

## 2021-11-10 DIAGNOSIS — R627 Adult failure to thrive: Principal | ICD-10-CM

## 2021-11-10 DIAGNOSIS — K220 Achalasia of cardia: Principal | ICD-10-CM

## 2021-11-11 DIAGNOSIS — K220 Achalasia of cardia: Principal | ICD-10-CM

## 2021-11-11 DIAGNOSIS — R627 Adult failure to thrive: Principal | ICD-10-CM

## 2021-11-11 MED ORDER — CLONAZEPAM 0.25 MG PO TBDP: ORAL_TABLET | ORAL | 0 refills | Status: DC

## 2021-11-12 DIAGNOSIS — K220 Achalasia of cardia: Principal | ICD-10-CM

## 2021-11-12 DIAGNOSIS — R627 Adult failure to thrive: Principal | ICD-10-CM

## 2021-11-13 ENCOUNTER — Encounter (INDEPENDENT_AMBULATORY_CARE_PROVIDER_SITE_OTHER): Payer: Self-pay

## 2021-11-13 MED ORDER — BRIVIACT 100 MG PO TABS: 100.0000 mg | ORAL_TABLET | Freq: Two times a day (BID) | ORAL | 5 refills | Status: DC

## 2021-11-16 DIAGNOSIS — R627 Adult failure to thrive: Principal | ICD-10-CM

## 2021-11-16 DIAGNOSIS — K220 Achalasia of cardia: Principal | ICD-10-CM

## 2021-11-17 DIAGNOSIS — R1310 Dysphagia, unspecified: Principal | ICD-10-CM

## 2021-11-17 DIAGNOSIS — R627 Adult failure to thrive: Principal | ICD-10-CM

## 2021-11-17 DIAGNOSIS — K56609 Unspecified intestinal obstruction, unspecified as to partial versus complete obstruction: Principal | ICD-10-CM

## 2021-11-18 ENCOUNTER — Ambulatory Visit: Admit: 2021-11-18 | Discharge: 2021-11-19 | Payer: MEDICARE | Attending: Registered" | Primary: Registered"

## 2021-11-24 DIAGNOSIS — R1310 Dysphagia, unspecified: Principal | ICD-10-CM

## 2021-11-24 DIAGNOSIS — R627 Adult failure to thrive: Principal | ICD-10-CM

## 2021-11-25 DIAGNOSIS — K220 Achalasia of cardia: Principal | ICD-10-CM

## 2021-11-25 DIAGNOSIS — R627 Adult failure to thrive: Principal | ICD-10-CM

## 2021-11-26 ENCOUNTER — Encounter (INDEPENDENT_AMBULATORY_CARE_PROVIDER_SITE_OTHER): Payer: Self-pay

## 2021-11-27 ENCOUNTER — Other Ambulatory Visit: Payer: Self-pay

## 2021-11-27 ENCOUNTER — Other Ambulatory Visit: Payer: Medicare Other | Admitting: Family

## 2021-11-27 ENCOUNTER — Encounter (INDEPENDENT_AMBULATORY_CARE_PROVIDER_SITE_OTHER): Payer: Self-pay

## 2021-11-27 DIAGNOSIS — G40209 Localization-related (focal) (partial) symptomatic epilepsy and epileptic syndromes with complex partial seizures, not intractable, without status epilepticus: Secondary | ICD-10-CM | POA: Diagnosis not present

## 2021-11-27 DIAGNOSIS — F840 Autistic disorder: Secondary | ICD-10-CM

## 2021-11-27 DIAGNOSIS — R748 Abnormal levels of other serum enzymes: Secondary | ICD-10-CM

## 2021-11-27 DIAGNOSIS — G40309 Generalized idiopathic epilepsy and epileptic syndromes, not intractable, without status epilepticus: Secondary | ICD-10-CM

## 2021-11-27 DIAGNOSIS — G4700 Insomnia, unspecified: Secondary | ICD-10-CM

## 2021-11-27 DIAGNOSIS — Z1329 Encounter for screening for other suspected endocrine disorder: Secondary | ICD-10-CM

## 2021-11-27 DIAGNOSIS — Z131 Encounter for screening for diabetes mellitus: Secondary | ICD-10-CM

## 2021-11-27 DIAGNOSIS — Z13220 Encounter for screening for lipoid disorders: Secondary | ICD-10-CM

## 2021-11-27 DIAGNOSIS — R638 Other symptoms and signs concerning food and fluid intake: Secondary | ICD-10-CM

## 2021-11-27 DIAGNOSIS — Z932 Ileostomy status: Secondary | ICD-10-CM

## 2021-11-27 DIAGNOSIS — G40909 Epilepsy, unspecified, not intractable, without status epilepticus: Secondary | ICD-10-CM

## 2021-11-27 DIAGNOSIS — F71 Moderate intellectual disabilities: Secondary | ICD-10-CM

## 2021-11-27 DIAGNOSIS — Z931 Gastrostomy status: Secondary | ICD-10-CM

## 2021-11-27 DIAGNOSIS — E43 Unspecified severe protein-calorie malnutrition: Secondary | ICD-10-CM

## 2021-11-27 NOTE — Progress Notes (Unsigned)
ROLLIE HYNEK   MRN:  [id number]  01/12/1986   Provider: Elveria Rising NP-C Location of Care: [facility] Child [facility]  Visit type:   Last visit:   Referral source:  History from:   Brief history:  Copied from previous record:   Today's concerns:  *** has been otherwise generally healthy since he was last seen. Neither *** nor mother have other health concerns for *** today other than previously mentioned.   Review of systems: Please see HPI for neurologic and other pertinent review of systems. Otherwise all other systems were reviewed and were negative.  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   Abdominal pain 08/21/2020   Hypokalemia 08/21/2020   Hypomagnesemia 08/21/2020   Acute esophagitis    Seizure disorder (HCC)    Constipation 04/14/2020   Decreased oral intake 04/14/2020   Dysphagia 04/14/2020   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   Autism spectrum disorder with accompanying language impairment and intellectual disability, requiring very substantial support 07/23/2014   Generalized convulsive epilepsy (HCC) 04/02/2013   Partial epilepsy with impairment of consciousness (HCC) 04/02/2013   Moderate intellectual disabilities 04/02/2013   Insomnia, unspecified 04/02/2013   Intermittent explosive disorder 04/02/2013   Mental and behavioral problem 20-May-1986     Past Medical History:  Diagnosis Date   Autism    GERD (gastroesophageal reflux disease)    Seizures  (HCC)     Past medical history comments: See HPI Copied from previous record:   Surgical history: Past Surgical History:  Procedure Laterality Date   CIRCUMCISION  1987   ESOPHAGOGASTRODUODENOSCOPY N/A 04/17/2020   Procedure: ESOPHAGOGASTRODUODENOSCOPY (EGD);  Surgeon: Pasty Spillers, MD;  Location: [facility] ENDOSCOPY;  Service: Endoscopy;  Laterality: N/A;   ESOPHAGOGASTRODUODENOSCOPY (EGD) WITH PROPOFOL N/A 08/28/2020   Procedure: ESOPHAGOGASTRODUODENOSCOPY (EGD) WITH PROPOFOL;  Surgeon: Toney Reil, MD;  Location: [facility] ENDOSCOPY;  Service: Gastroenterology;  Laterality: N/A;     Family history: family history includes Alzheimer's disease in his maternal grandfather; Cancer in his maternal grandmother; Stroke in his paternal grandfather.   Social history: Social History   Socioeconomic History   Marital status: Single    Spouse name: Not on file   Number of children: Not on file   Years of education: Not on file   Highest education level: Not on file  Occupational History   Not on file  Tobacco Use   Smoking status: Never    Passive exposure: Never   Smokeless tobacco: Never  Substance and Sexual Activity   Alcohol use: No    Alcohol/week: 0.0 standard drinks   Drug use: No   Sexual activity: Never  Other Topics Concern   Not on file  Social History Narrative   Jaymar does not attend any school or day program at this time.    He lives with his parents.    He enjoys listening to music, playing with remote control cars, and collecting newspapers.   Social Determinants of Health   Financial Resource Strain: Not on file  Food Insecurity: Not on file  Transportation Needs: Not on file  Physical Activity: Not on file  Stress: Not on file  Social Connections: Not on file  Intimate Partner Violence: Not on file      Past/failed meds: Copied from previous record:  Allergies: No Known Allergies    Immunizations:  There is no immunization history on  file for this patient.    Diagnostics/Screenings: Copied from previous record:   Physical Exam: There were no vitals taken for this visit.    Impression: No diagnosis found.    Recommendations for plan of care: The patient's previous [facility] records were reviewed. *** has neither had nor required imaging or lab studies since the last visit.   The medication list was reviewed and reconciled. No changes were made in the prescribed medications today. A complete medication list was provided to the patient.  No orders of the defined types were placed in this encounter.   No follow-ups on file.   Allergies as of 11/27/2021   No Known Allergies      Medication List        Accurate as of November 27, 2021  2:25 PM. If you have any questions, ask your nurse or doctor.          Briviact 100 MG Tabs tablet Generic drug: brivaracetam Take 1 tablet (100 mg total) by mouth 2 (two) times daily.   clonazePAM 0.25 MG disintegrating tablet Commonly known as: KLONOPIN Place 1 tablet under the tongue every 8 hours for 24 hours when seizures occur.   felbamate 600 MG/5ML suspension Commonly known as: FELBATOL Take 8 mL twice daily   hydrOXYzine 25 MG capsule Commonly known as: VISTARIL Give 1 capsule at bedtime   LORazepam 2 MG/ML concentrated solution Commonly known as: ATIVAN   melatonin 1 MG Tabs tablet Take by mouth.   pantoprazole sodium 40 mg Commonly known as: PROTONIX Take by mouth.   simethicone 40 MG/0.6ML drops Commonly known as: MYLICON            I discussed this patient's care with the multiple providers involved in his care today to develop this assessment and plan.   Total time spent with the patient was *** minutes, of which 50% or more was spent in counseling and coordination of care.  Elveria Rising NP-C [facility] Child [facility] Ph. [phone] Fax [phone]

## 2021-12-01 ENCOUNTER — Encounter (INDEPENDENT_AMBULATORY_CARE_PROVIDER_SITE_OTHER): Payer: Self-pay | Admitting: Family

## 2021-12-01 ENCOUNTER — Other Ambulatory Visit (INDEPENDENT_AMBULATORY_CARE_PROVIDER_SITE_OTHER): Payer: Self-pay | Admitting: Family

## 2021-12-01 DIAGNOSIS — F840 Autistic disorder: Secondary | ICD-10-CM

## 2021-12-01 DIAGNOSIS — R638 Other symptoms and signs concerning food and fluid intake: Secondary | ICD-10-CM

## 2021-12-01 DIAGNOSIS — Z1329 Encounter for screening for other suspected endocrine disorder: Secondary | ICD-10-CM

## 2021-12-01 DIAGNOSIS — Z131 Encounter for screening for diabetes mellitus: Secondary | ICD-10-CM

## 2021-12-01 DIAGNOSIS — Z931 Gastrostomy status: Secondary | ICD-10-CM | POA: Insufficient documentation

## 2021-12-01 DIAGNOSIS — G40909 Epilepsy, unspecified, not intractable, without status epilepticus: Secondary | ICD-10-CM

## 2021-12-01 DIAGNOSIS — Z13220 Encounter for screening for lipoid disorders: Secondary | ICD-10-CM

## 2021-12-01 DIAGNOSIS — R748 Abnormal levels of other serum enzymes: Secondary | ICD-10-CM

## 2021-12-01 DIAGNOSIS — E43 Unspecified severe protein-calorie malnutrition: Secondary | ICD-10-CM

## 2021-12-01 NOTE — Addendum Note (Signed)
Addended by: Princella Ion on: 12/01/2021 12:11 PM ? ? Modules accepted: Orders ? ?

## 2021-12-01 NOTE — Patient Instructions (Signed)
Thank you for allowing me to see Clyde in your home today.  ? ?Instructions until your next appointment are as follows: ?1. Try to video the event if Kerim has more seizures ?2. Be sure to keep the appointment in April with Dr Artis Flock ? ?At [facility], we are committed to providing exceptional care. You will receive a patient satisfaction survey through text or email regarding your visit today. Your opinion is important to me. Comments are appreciated.   ?

## 2021-12-03 ENCOUNTER — Telehealth: Admit: 2021-12-03 | Discharge: 2021-12-04 | Payer: MEDICARE | Attending: Registered" | Primary: Registered"

## 2021-12-04 ENCOUNTER — Encounter (INDEPENDENT_AMBULATORY_CARE_PROVIDER_SITE_OTHER): Payer: Self-pay

## 2021-12-08 LAB — CBC WITH DIFFERENTIAL/PLATELET
Absolute Monocytes: 349 cells/uL (ref 200–950)
Basophils Absolute: 21 cells/uL (ref 0–200)
Basophils Relative: 0.5 %
Eosinophils Absolute: 29 cells/uL (ref 15–500)
Eosinophils Relative: 0.7 %
HCT: 41.6 % (ref 38.5–50.0)
Hemoglobin: 13.6 g/dL (ref 13.2–17.1)
Lymphs Abs: 1144 cells/uL (ref 850–3900)
MCH: 27.8 pg (ref 27.0–33.0)
MCHC: 32.7 g/dL (ref 32.0–36.0)
MCV: 84.9 fL (ref 80.0–100.0)
MPV: 9.6 fL (ref 7.5–12.5)
Monocytes Relative: 8.5 %
Neutro Abs: 2558 cells/uL (ref 1500–7800)
Neutrophils Relative %: 62.4 %
Platelets: 406 10*3/uL — ABNORMAL HIGH (ref 140–400)
RBC: 4.9 10*6/uL (ref 4.20–5.80)
RDW: 19 % — ABNORMAL HIGH (ref 11.0–15.0)
Total Lymphocyte: 27.9 %
WBC: 4.1 10*3/uL (ref 3.8–10.8)

## 2021-12-08 LAB — LIPID PANEL
Cholesterol: 147 mg/dL (ref <200)
HDL: 59 mg/dL (ref >=40)
LDL Cholesterol (Calc): 71 mg/dL (calc)
Non-HDL Cholesterol (Calc): 88 mg/dL (calc) (ref <130)
Total CHOL/HDL Ratio: 2.5 (calc) (ref <5.0)
Triglycerides: 90 mg/dL (ref <150)

## 2021-12-08 LAB — COMPREHENSIVE METABOLIC PANEL
AG Ratio: 1.2 (calc) (ref 1.0–2.5)
ALT: 47 U/L — ABNORMAL HIGH (ref 9–46)
AST: 30 U/L (ref 10–40)
Albumin: 4.7 g/dL (ref 3.6–5.1)
Alkaline phosphatase (APISO): 179 U/L — ABNORMAL HIGH (ref 36–130)
BUN: 23 mg/dL (ref 7–25)
CO2: 25 mmol/L (ref 20–32)
Calcium: 10.2 mg/dL (ref 8.6–10.3)
Chloride: 95 mmol/L — ABNORMAL LOW (ref 98–110)
Creat: 1.06 mg/dL (ref 0.60–1.26)
Globulin: 3.9 g/dL (calc) — ABNORMAL HIGH (ref 1.9–3.7)
Glucose, Bld: 122 mg/dL (ref 65–139)
Potassium: 4.4 mmol/L (ref 3.5–5.3)
Sodium: 133 mmol/L — ABNORMAL LOW (ref 135–146)
Total Bilirubin: 0.5 mg/dL (ref 0.2–1.2)
Total Protein: 8.6 g/dL — ABNORMAL HIGH (ref 6.1–8.1)

## 2021-12-08 LAB — HEMOGLOBIN A1C
Hgb A1c MFr Bld: 5.4 % of total Hgb (ref <5.7)
Mean Plasma Glucose: 108 mg/dL
eAG (mmol/L): 6 mmol/L

## 2021-12-08 LAB — VITAMIN D 1,25 DIHYDROXY
Vitamin D 1, 25 (OH)2 Total: 60 pg/mL (ref 18–72)
Vitamin D2 1, 25 (OH)2: <8 pg/mL
Vitamin D3 1, 25 (OH)2: 60 pg/mL

## 2021-12-08 LAB — TSH: TSH: 0.92 mIU/L (ref 0.40–4.50)

## 2021-12-08 LAB — VITAMIN B12: Vitamin B-12: 1789 pg/mL — ABNORMAL HIGH (ref 200–1100)

## 2021-12-09 ENCOUNTER — Ambulatory Visit: Admit: 2021-12-09 | Discharge: 2021-12-10 | Payer: MEDICARE | Attending: Registered" | Primary: Registered"

## 2021-12-22 ENCOUNTER — Telehealth: Admit: 2021-12-22 | Discharge: 2021-12-23 | Payer: MEDICARE | Attending: Registered" | Primary: Registered"

## 2021-12-23 NOTE — Progress Notes (Signed)
? ?Patient: Jonathan AngelKevin L Lambrecht MRN: [id number] ?Sex: male DOB: September 02, 1986 ? ?Provider: Lorenz CoasterStephanie Thomas Rhude, MD ?Location of Care: [facility] ?Note type: Routine return visit ? ?History of Present Illness: ?Referral Source: Harlene Saltsavid Johnson, MD ?History from: patient and prior records ?Chief Complaint: complex care intake  ? ?Jonathan Kirby is a 36 y.o. male with history of  autism and focal epilepsy who I last saw on 09/07/21 where I continued Felbatol and recommended melatonin and atarax PRN for sleep and am seeing by the request of Referring Provider for consultation on complex care management. Records were extensively reviewed prior to this appointment and documented as below where appropriate.  Patient was seen prior to this appointment by Elveria Risingina Goodpasture for initial intake on 09/15/21, and care plan was created (see snapshot), she also saw him on 10/30/21 and 11/27/21 for home visits.  ? ?Patient presents today with his parents who report the following: ? ?Symptom management:  ?He hasn't had any seizures since early march. ? ?He has recently been experiencing dizziness in the morning before she gives him his morning dose. About 2-3 hours after getting up in the morning  ? ?They should be giving him water and Osmolite with each feed, 4 times a day. However, they have been unable to get the fourth feeding in. This does cause him to miss some water. Have talked about trying continuous feeds.  ? ?When he first started taking Briviact, his sleep improved. Did not need to give him melatonin. Recently has been waking up very early, even with 1 mg of melatonin. At 2 or 3 mg of melatonin he will be groggy in the morning. Sometimes he has been waking up as she needs to change out his urine bag at night.  ? ?Has some events of agitation that seem ike he may be in pain. He is not having as much reflux, only if he eats something that was greasy. She give protonix only 40 mL in the morning as he gets gas  when she gives him the dose at nigt he will be gassy.  ? ?Care coordination (other providers): ?Mom reports that Jonathan Kirby is tolerating PEG feedings fairly well. [facility] GI [facility] ? ?Past Medical History ?Past Medical History:  ?Diagnosis Date  ? Autism   ? GERD (gastroesophageal reflux disease)   ? Seizures (HCC)   ? ? ?Surgical History ?Past Surgical History:  ?Procedure Laterality Date  ? CIRCUMCISION  1987  ? ESOPHAGOGASTRODUODENOSCOPY N/A 04/17/2020  ? Procedure: ESOPHAGOGASTRODUODENOSCOPY (EGD);  Surgeon: Pasty Spillersahiliani, Varnita B, MD;  [facility];  Service: Endoscopy;  Laterality: N/A;  ? ESOPHAGOGASTRODUODENOSCOPY (EGD) WITH PROPOFOL N/A 08/28/2020  ? Procedure: ESOPHAGOGASTRODUODENOSCOPY (EGD) WITH PROPOFOL;  Surgeon: Toney ReilVanga, Rohini Reddy, MD;  [facility];  Service: Gastroenterology;  Laterality: N/A;  ? ? ?Family History ?family history includes Alzheimer's disease in his maternal grandfather; Cancer in his maternal grandmother; Stroke in his paternal grandfather. ? ? ?Social History ?Social History  ? ?Social History Narrative  ? Jonathan Kirby does not attend any school or day program at this time.   ? He lives with his parents.   ? He enjoys listening to music, playing with remote control cars, and collecting newspapers.  ? ? ?Allergies ?Allergies  ?Allergen Reactions  ? Lactose Intolerance (Gi)   ?  Upset stomach, excessive gas  ? Vimpat [Lacosamide]   ?  Elevated liver enzymes after starting TPN & Vimpat  ? ? ?Medications ?  Current Outpatient Medications on File Prior to Visit  ?Medication Sig Dispense Refill  ? Acetaminophen Childrens 160 MG CHEW Chew 480 mg by mouth every 6 (six) hours as needed.    ? BRIVIACT 100 MG TABS tablet Take 1 tablet (100 mg total) by mouth 2 (two) times daily. 60 tablet 5  ? clonazePAM (KLONOPIN) 0.25 MG disintegrating tablet Place 1 tablet under the tongue every 8 hours for 24 hours when seizures occur. 15 tablet 0  ? melatonin 1 MG  TABS tablet Take 1 mg by mouth at bedtime as needed.    ? Multiple Vitamins-Minerals (CENTRUM MULTIGUMMIES) CHEW Chew 2 Pieces by mouth daily.    ? pantoprazole sodium (PROTONIX) 40 mg Take 40 mg by mouth daily.    ? simethicone (MYLICON) 40 MG/0.6ML drops Take 40 mg by mouth every 6 (six) hours as needed.    ? hydrOXYzine (VISTARIL) 25 MG capsule Give 1 capsule at bedtime (Patient not taking: Reported on 12/24/2021) 30 capsule 0  ? ?No current facility-administered medications on file prior to visit.  ? ?The medication list was reviewed and reconciled. All changes or newly prescribed medications were explained.  A complete medication list was provided to the patient/caregiver. ? ?Physical Exam ?Pulse 83   Temp 98.6 ?F (37 ?C) (Temporal)   Resp 16   Wt 110 lb (49.9 kg)   SpO2 100%   BMI 18.30 kg/m?  ?Weight for age: Facility age limit for growth percentiles is 20 years.  ?Length for age: Facility age limit for growth percentiles is 20 years. ?BMI: Body mass index is 18.3 kg/m?Marland Kitchen ?No results found. ?Gen: well appearing neuroaffected adult ?Skin: No rash, No neurocutaneous stigmata. ?HEENT: Microcephalic, no dysmorphic features, no conjunctival injection, nares patent, mucous membranes moist, oropharynx clear.  ?Neck: Supple, no meningismus. No focal tenderness. ?Resp: Clear to auscultation bilaterally ?CV: Regular rate, normal S1/S2, no murmurs, no rubs ?Abd: BS present, abdomen soft, non-tender, non-distended. No hepatosplenomegaly or mass ?Ext: Warm and well-perfused. No deformities, no muscle wasting, ROM full. ? ?Neurological Examination: ?MS: Awake, alert.  Nonverbal, but interactive, reacts appropriately to conversation.   ?Cranial Nerves: Pupils were equal and reactive to light;  No clear visual field defect, no nystagmus; no ptsosis, face symmetric with full strength of facial muscles, hearing grossly intact, palate elevation is symmetric. ?Motor-Fairly normal tone throughout, moves extremities at least  antigravity. No abnormal movements ?Reflexes- Reflexes 2+ and symmetric in the biceps, triceps, patellar and achilles tendon. Plantar responses flexor bilaterally, no clonus noted ?Sensation: Responds to touch in all extremities.  ?Coordination: Does not reach for objects.  ?Gait: wheelchair dependent  ? ?Diagnosis:  ?Problem List Items Addressed This Visit   ? ?  ? Digestive  ? Dysphagia  ? Ileus (HCC)  ?  ? Nervous and Auditory  ? 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  ? Other  ? Insomnia, unspecified (Chronic)  ? Autism spectrum disorder with accompanying language impairment and intellectual disability, requiring very substantial support  ? Ileostomy in place [facility])  ? Elevated liver enzymes  ? PEG (percutaneous endoscopic gastrostomy) status (HCC)  ? ? ?Assessment and Plan ?AMARU BURROUGHS is a 36 y.o. male with history of  autism and focal epilepsy who presents to establish care in the pediatric complex care clinic.  I discussed with family regarding the role of complex care clinic which includes managing complex symptoms, help to coordinate care and provide local resources when possible, and clarifying goals  of care and decision making needs.  Patient will continue to go to subspecialists and PCP for relevant services. A care plan is created for each patient which is in Epic under snapshot, and a physical binder provided to the patient, that can be used for anyone providing care for the patient. Patient seen by case manager, dietician, and integrated behavioral health today. Please see accompanying notes. I discussed case with all involved parties for coordination of care and recommend patient follow their instructions as below.   ? ?Symptom management:  ?Discussed with the family that the dizziness he is experiencing my be a side affect of his Briviact, and offered that we could switch him to Keppra to see if this causes less of this side effect. However, family does not  want to switch medication as his seizures have just gotten back under control. To address dizziness, recommend they improve his hydration and focus on good sleep. For sleep I also provided information in gaba

## 2021-12-24 ENCOUNTER — Ambulatory Visit (INDEPENDENT_AMBULATORY_CARE_PROVIDER_SITE_OTHER): Payer: Medicare Other

## 2021-12-24 ENCOUNTER — Encounter (INDEPENDENT_AMBULATORY_CARE_PROVIDER_SITE_OTHER): Payer: Self-pay | Admitting: Pediatrics

## 2021-12-24 ENCOUNTER — Ambulatory Visit (INDEPENDENT_AMBULATORY_CARE_PROVIDER_SITE_OTHER): Payer: Medicare Other | Admitting: Pediatrics

## 2021-12-24 VITALS — HR 83 | Temp 98.6°F | Resp 16 | Wt 110.0 lb

## 2021-12-24 DIAGNOSIS — F840 Autistic disorder: Secondary | ICD-10-CM

## 2021-12-24 DIAGNOSIS — Z932 Ileostomy status: Secondary | ICD-10-CM

## 2021-12-24 DIAGNOSIS — Z931 Gastrostomy status: Secondary | ICD-10-CM | POA: Diagnosis not present

## 2021-12-24 DIAGNOSIS — Z7189 Other specified counseling: Secondary | ICD-10-CM

## 2021-12-24 DIAGNOSIS — G4700 Insomnia, unspecified: Secondary | ICD-10-CM

## 2021-12-24 DIAGNOSIS — G40209 Localization-related (focal) (partial) symptomatic epilepsy and epileptic syndromes with complex partial seizures, not intractable, without status epilepticus: Secondary | ICD-10-CM | POA: Diagnosis not present

## 2021-12-24 DIAGNOSIS — R1310 Dysphagia, unspecified: Secondary | ICD-10-CM

## 2021-12-24 DIAGNOSIS — R748 Abnormal levels of other serum enzymes: Secondary | ICD-10-CM

## 2021-12-24 DIAGNOSIS — K567 Ileus, unspecified: Secondary | ICD-10-CM

## 2021-12-24 MED ORDER — FAMOTIDINE 40 MG PO TABS: 40.0000 mg | ORAL_TABLET | Freq: Every day | ORAL | 3 refills | Status: DC

## 2021-12-24 NOTE — Patient Instructions (Addendum)
Reach out to your dietician about getting in extra 180 mL of water if he misses his fourth feed.  ?Try giving 1 mg of melatonin every night to see if he can sleep through the night, you can give this at 7pm.  ?Try adding pepcid back in at night to go with the 40 mL of protonix in the morning. Please tell Dr. Loni Muse about this change.  ?Think about gabapentin which may help with Visceral hyperalgesia (his gut discomfort), however, this can cause sedation.  ?

## 2021-12-24 NOTE — Progress Notes (Signed)
Critical for Continuity of Care - Do Not Delete ?                 Jonathan Kirby ?                  DOB: Apr 09, 1986 ? ?AVOID THE WORD "YES" it upsets him ?Loves Music, remote control cars ?Does not like to be touched ?Has an Ileostomy RLQ ?PEG placement 10/23/2021  20 fr with 10 ml of water in the balloon ? ? ?Brief History:  ? 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 He will repeat words he hears but mostly communicates through pointing. His seizures have been well controlled with Febatol unless medication dose is missed. Jonathan Kirby has been admitted several times for dehydration due to poor PO intake. He was admitted in February with intermittent SBO for which he underwent lysis of adhesions on 11/13/20. Jonathan Kirby had several admissions over the summer and has received Botox injections to treat his achalasia. Due to his poor oral intake resulting in dehydration, Jonathan Kirby has required TPN since early November as primary means of nutrition.This and his medications are the probable cause of his abnormal liver functions and are being monitored as they gradually decrease. [name] was admitted 10/15/2021 for melena, elevated liver enzymes, anemia and seizures.  ? ?Guardians/Caregivers: ?Mother: Kia Stavros ph. [phone] ?Father: Abdifatah Colquhoun ph. [phone] or [phone] ? ?Baseline Function: ?Cognitive - unable to follow instructions ?Neurologic - Autistic, Seizures ?Communication - non-verbal ?Cardiovascular - hx of tachycardia possibly secondary to anemia or dehydration ?Vision -   ?Hearing - ?Pulmonary - normal respirations ?GI - Ileostomy, recurrent small bowel obstructions, TPN through central line ?Urinary - ?Motor - can ambulate in home, wheelchair long distances ? ?Symptom  management/Treatments: ?Seizures: Briviact 10/21/21  ?Reflux: Tums, Protonix, pepcid hs ?Anxiety/Mood: Ativan ?Sleep problems: Atarax (Hydroxyzine) not started as of 10/27/2021 and Melatonin (if does not help may rx Gabapentin)  ? ?[name]?s Daily Medications  ? Morning Afternoon Evening Bedtime  ?Brivaracetam (Briviact) 100 mg (1 tab)  100 mg (1 tab)   ?Multivitamin  ?(Centrum Multigummies)  300 mcg (2 chews)     ?Pantoprazole (Protonix) 40 mg (1 tab)     ?      ?      ?      ?Other medications: Hydroxyzine (Vistaril) 25 mg (1 cap) nightly [has not yet started], Osmolyte 1.5   ?As needed medications: Clonazepam, melatonin, simethicone, Tylenol   ? ?Past/failed meds: ?Haldol= tachycardia ?Vimpat 10/23/21 ?TPN- elevated liver enzymes on Vimpat, Felbatol while on TPN ? ?Feeding: Lactose Intolerant ?DME:  [facility] fax: [phone]  ?Formula: Osmolite 1.5 (1 carton 4 x a day) currently getting 3 cartons  ?Current regimen: bolus feeds  ?Day feeds: 1 carton 4x during the day, 90 mL FWF ac/pc tube feeds ?Overnight feeds: none ?FWF:  90 ML before and after daytime feedings and additional 500 ml spread out through the day ?Notes: 724 ml free water from formula- try continuous feeding with backpack during the day to assist with getting the 4 th feeding in him, if unable to give the 4th carton give 180 of water to help with hydration/dizziness ?Supplements:  ? ?Recent Events: ?1/8-1/13/2023: Admitted: small bowel obstruction. tube was inserted into his stoma for decompression.   ?10/16/2021 Admitted with Melena and  seizures, anemia and elevated liver functions ? ?Care Needs/Upcoming Plans: ?01/05/2022 2:30 PM [facility] Dietitian ?01/29/2022 10:30 AM [facility] GI  ?Trial of Protonix AM and Pepcid PM  ?Information about Vistaril, Gabapentin use for Visceral Hyperalgesia  ?  ?Providers: ?Marletta Lor, DNP (PCP) [phone] fax [phone] ?Lorenz Coaster, MD [facility] Child Neurology and Pediatric Complex Care) ph [phone] fax  [phone] ?Elveria Rising NP-C [facility] Pediatric Complex Care) ph [phone] fax [phone] ?Vita Barley, RN [facility] Pediatric Complex Care Case Manager) ph [phone] fax [phone] ?Barton Fanny, MD [facility] Gastroenterology) ph. [phone] Fax [phone] ?Durenda Guthrie, RD/LDN [facility] Dietitian) ph. [phone] fax [phone] ?Dentist: Paramedic ([email or web address]) Wendee Copp DDS (Dr. Reece Agar) ? ?Community support/services: ?[facility]: ph. [phone] Melissa holland RN [facility] Skilled nursing ? ?Equipment/DME Supplies Providers ?[facility]: ph. [phone] fax [phone]  feeding supplies, pole, pump and back pack ?[employer] Ost[employer] supplies- [employer] ? ?Goals of care: ? ?Advanced care planning: ? ? ?Psychosocial: ? ? ?Diagnostics/Screenings: ?08/28/2020 EEG: could be fragments of generalized epilepsy or multifocal epilepsy.  Additionally there is evidence of moderate diffuse encephalopathy, which could be part of patient's epileptic encephalo ?06/26/2021 GD with FLIP: findings suggestive of Type 1 achalasia. ?07/20/2021 CT of Abd, Pelvis & Chest:  no intraabdominal pathology.  RLQ ileostomy, no obstruction, numerous decompressed small bowel loops with trace of mesenteric edema and engorged vasa recta which can occur with inflammatory/infectious enteritis ?07/23/2021 EGD with botox injection An area at the lower esophageal sphincter was injected with botox, 1 cm hiatal hernia was present, cardia and gastric fundus were normal on retroflexion, stomach was normal, examined duodenum was normal. No acute abnormality of the airways, lungs, pleura, or mediastinum. ?*Mild thickening of the distal esophagus is nonspecific but could be related to gastroesophageal reflux. ?10/15/2021 CT of Abd: Mild thickening of the distal esophagus which is nonspecific, however can be seen in the setting of esophagitis. Mild thickening and urinary  bladder which may be related to underdistention but can be seen with cystitis.Redemonstrated sequelae of colectomy with right lower quadrant end ileostomy. ?10/16/2021 EGD with Corpak placement: no evidence of GI bleeding to explain melena, h/h has remained stable Gastric oxyntic mucosa with mild chronic inactive gastritis. Negative for Helicobacter organisms on H&E stain. ?10/20/2021 U/S Liver Doppler: patent hepatic vasculature with normal flow direction. Cholelithiasis. ?10/23/2021 Liver Biopsy and PEG tube placement:  ? ? ?Elveria Rising NP-C and Lorenz Coaster, MD ?Pediatric Complex Care Program ?Ph: [phone] ?Fax: [phone]  ? ? ?Problem List ?As of 09/07/2021 10:54 AM ? ?Insomnia, unspecified,   Misophonia, Moderate intellectual disabilities, Partial epilepsy with impairment of consciousness (HCC), Sleep arousal disorder, Abdominal distension ?Abdominal pain, Acute esophagitis, Acute metabolic encephalopathy, Aspiration pneumonia of both lower lobes due to gastric secretions (HCC), Autism spectrum disorder with accompanying language impairment and intellectual disability, requiring very substantial support, Constipation, Decreased oral intake, Diplegia (HCC), Dysphagia, Generalized convulsive epilepsy (HCC) ?Hypokalemia, Hypomagnesemia, Ileus (HC, Intermittent explosive disorder, Malnutrition of moderate degree, Mental and behavioral problem, Ogilvie syndrome, Seizure disorder (HCC) ?Severe protein-calorie malnutrition [name]: less than 60% of standard weight) (HCC) ?

## 2021-12-28 ENCOUNTER — Encounter (INDEPENDENT_AMBULATORY_CARE_PROVIDER_SITE_OTHER): Payer: Self-pay | Admitting: Pediatrics

## 2021-12-29 NOTE — Progress Notes (Signed)
RN demonstrated for parents how to vent his feeding tube using a 60 cc syringe. Large amount of gas was expelled. Advised to vent him before each feeding and anytime he seems uncomfortable. The ostomy bag fills with gas and has to be deflated frequently or it pops. Advised by removing the gas from the stomach he should not have as much in the ostomy bag. Dr. Rogers Blocker rec mom discuss changing his formula with the dietitian due to him having so much gas it is difficult to feed him before bed because she has to keep deflating his ostomy bag during the night. They cannot do night feeds because he cannot sleep elevated. They are going to try a back pack feeding pump to see if they can do continuous feeds so he gets all of his feeding but difficult to do when it has to hang on a feeding pole. Tubey buddies and cleaning straw given.  ?RN showed mom what a low profile mini-one feeding button looks like. She reports they said they would change his to something else after he has had this one for about 6-8 wks.  ?15 min teaching about feeding tube, farrell bags, venting the tube and giving online resources for education ?

## 2021-12-30 ENCOUNTER — Encounter (INDEPENDENT_AMBULATORY_CARE_PROVIDER_SITE_OTHER): Payer: Self-pay | Admitting: Pediatrics

## 2022-01-05 ENCOUNTER — Ambulatory Visit: Admit: 2022-01-05 | Discharge: 2022-01-06 | Payer: MEDICARE | Attending: Registered" | Primary: Registered"

## 2022-01-10 ENCOUNTER — Ambulatory Visit: Payer: MEDICARE

## 2022-01-10 DIAGNOSIS — T50905D Adverse effect of unspecified drugs, medicaments and biological substances, subsequent encounter: Principal | ICD-10-CM

## 2022-01-10 DIAGNOSIS — F909 Attention-deficit hyperactivity disorder, unspecified type: Principal | ICD-10-CM

## 2022-01-11 ENCOUNTER — Encounter (INDEPENDENT_AMBULATORY_CARE_PROVIDER_SITE_OTHER): Payer: Self-pay | Admitting: Pediatrics

## 2022-01-14 ENCOUNTER — Encounter (INDEPENDENT_AMBULATORY_CARE_PROVIDER_SITE_OTHER): Payer: Self-pay

## 2022-01-14 ENCOUNTER — Telehealth (INDEPENDENT_AMBULATORY_CARE_PROVIDER_SITE_OTHER): Payer: Medicare Other | Admitting: Pediatrics

## 2022-01-14 ENCOUNTER — Other Ambulatory Visit: Payer: Self-pay

## 2022-01-14 ENCOUNTER — Ambulatory Visit (INDEPENDENT_AMBULATORY_CARE_PROVIDER_SITE_OTHER): Payer: Medicare Other

## 2022-01-14 VITALS — Wt 113.0 lb

## 2022-01-14 DIAGNOSIS — G40309 Generalized idiopathic epilepsy and epileptic syndromes, not intractable, without status epilepticus: Secondary | ICD-10-CM

## 2022-01-14 DIAGNOSIS — K567 Ileus, unspecified: Secondary | ICD-10-CM

## 2022-01-14 DIAGNOSIS — R748 Abnormal levels of other serum enzymes: Secondary | ICD-10-CM | POA: Diagnosis not present

## 2022-01-14 DIAGNOSIS — G4700 Insomnia, unspecified: Secondary | ICD-10-CM

## 2022-01-14 DIAGNOSIS — G40209 Localization-related (focal) (partial) symptomatic epilepsy and epileptic syndromes with complex partial seizures, not intractable, without status epilepticus: Secondary | ICD-10-CM

## 2022-01-14 DIAGNOSIS — Z7189 Other specified counseling: Secondary | ICD-10-CM

## 2022-01-14 MED ORDER — FELBAMATE 600 MG/5ML PO SUSP: 840.0000 mg | Freq: Two times a day (BID) | ORAL | 0 refills | Status: DC

## 2022-01-14 MED ORDER — CLONAZEPAM 0.25 MG PO TBDP: ORAL_TABLET | ORAL | 3 refills | Status: DC

## 2022-01-14 MED ORDER — VALTOCO 20 MG DOSE 10 MG/0.1ML NA LQPK: 20.0000 mg | NASAL | 2 refills | Status: DC | PRN

## 2022-01-14 NOTE — Patient Instructions (Addendum)
Continue Felbatol 60ml twice daily.  New prescription sent today.  ?Recommend labwork in 2 weeks.  He can come to our office on Thursdays to get bloodwork drawn, or you can check with your PCP.  ?Klonopin is NOT to stop seizures in the moment. Instead we are providing Valtoco spray.  Seizures are likely to stop on their own, but if they do not stop within 2-3 minutes, ok to give Valtoco ? You can still give Klonopin every 8 hours after a seizure to prevent further seizures.  This will make him sleepy if you give it with Valtoco.  To limit sleepiness, recommend starting the Klonopin 4 hours after Valtoco.  ? ?Follow-up with Otila Kluver in 2-3 weeks, after labwork is drawn.  Plan to discuss increasing Felbamate at that appointment, and consider medications to help with sleep (Vistaril, Gabapentin, or another seizure medication to improve seizures and sleep)  ?

## 2022-01-14 NOTE — Progress Notes (Signed)
Critical for Continuity of Care - Do Not Delete ?                 Jonathan Kirby ?                  DOB: [birth year] ?AVOID THE WORD "YES" it upsets him ?Loves Music, remote control cars ?Does not like to be touched ?Has an Ileostomy RLQ ?PEG placement 10/23/2021  20 fr with 10 ml of water in the balloon ? ? ?Brief History:  ? 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 He will repeat words he hears but mostly communicates through pointing. His seizures have been well controlled with Febatol unless medication dose is missed. Jonathan Kirby has been admitted several times for dehydration due to poor PO intake. He was admitted in February with intermittent SBO for which he underwent lysis of adhesions on 11/13/20. Jonathan Kirby had several admissions over the summer and has received Botox injections to treat his achalasia. Due to his poor oral intake resulting in dehydration, Jonathan Kirby has required TPN since early November as primary means of nutrition.This and his medications are the probable cause of his abnormal liver functions and are being monitored as they gradually decrease. Jonathan Kirby was admitted 10/15/2021 for melena, elevated liver enzymes, anemia and seizures.  ? ?Guardians/Caregivers: ?Mother: Jonathan Kirby ph. [phone] ?Father: Jonathan Kirby ph. [phone] or [phone] ? ?Baseline Function: ?Cognitive - unable to follow instructions ?Neurologic - Autistic, Seizures ?Communication - non-verbal ?Cardiovascular - hx of tachycardia possibly secondary to anemia or dehydration ?Vision -   ?Hearing - appears to hear well ?Pulmonary - normal respirations ?GI - Ileostomy, recurrent small bowel obstructions, TPN through central line ?Urinary -  ?Motor - can ambulate in home, wheelchair long distances ? ?Symptom  management/Treatments: ?Seizures: Briviact 10/21/21  ?Reflux: Tums, Protonix, pepcid hs ?Anxiety/Mood: Ativan ?Sleep problems: Atarax (Hydroxyzine) not started as of 10/27/2021 or Gabapentin or seizure med that causes sedation possibly Zonegran ?Jonathan Kirby?s Daily Medications  ? Morning Afternoon Evening Bedtime  ?Felbamate (Felbatol) 7 ml (840 mg) incr ?To 8 ml in 2 wks (960 mg)  7 ml (840 mg) increase to 8 ml (960 mg) in 2 wks 4/27   ?Multivitamin  ?(Centrum Multigummies)  300 mcg (2 chews)     ?Pantoprazole (Protonix) 40 mg (1 tab)     ?Famotidine (Pepcid)  ?Complete    10 mg  ?Benadryl     25-50 mg  ?      ?Other medications: Hydroxyzine (Vistaril) 25 mg (1 cap) nightly [has not yet started], Osmolyte 1.5  ?Valtoco for seizures lasting over 2 min  ?As needed medications: Clonazepam, simethicone, Tylenol, Benadryl    ?Klonopin - give q 8 hrs x 24 hrs by mouth or tube if seizures are less than 2 min,  ?Valtoco  20 mg I application in each nare (10 mg each side) for seizures over 2 min and then follow in 4 hrs with Klonopin and repeat every 8 hrs for 24 hrs  ? ?Past/failed meds: ?Haldol= tachycardia ?Vimpat 10/23/21 ?TPN- elevated liver enzymes on Vimpat, Felbatol while on TPN ?Briviact = hyperactivity, tachycardia - stopped 01/10/2022 ?Melatonin possible headaches ? ?Feeding: Lactose Intolerant ?DME:  [facility] fax: [phone]  ?Formula: Osmolite 1.5 (1 carton 4 x a day) currently getting 3 cartons  ?Current regimen: bolus  feeds over about 30 min- vents tube before and after feeding and before bed ?Day feeds: 1 carton 4x during the day, 90 mL FWF ac/pc bolus feeds ?Overnight feeds: none ?FWF:  90 ML before and after daytime feedings and additional 500 ml spread out through the day ?Notes: 724 ml free water from formula- try continuous feeding with backpack during the day to assist with getting the 4 th feeding in him, if unable to give the 4th carton give 180 of water to help with  hydration/dizziness ?Supplements:  ? ?Recent Events: ?10/16/2021 Admitted with Melena and seizures, anemia and elevated liver functions ?01/10/2022 ER ? ?Care Needs/Upcoming Plans: ?01/29/2022 10:30 AM [facility]  ?02/03/2022 1:00 PM Nutritionist [facility] ?04/01/2022 2:30 PM Dr. Artis FlockWolfe ?Trial of Protonix AM and Pepcid PM  ?Information about Vistaril, Gabapentin use for Visceral Hyperalgesia  ?Discuss farrell bag with GI for venting ?  ?Providers: ?Jonathan LorJulie Barr, DNP (PCP) [phone] fax [phone] ?Jonathan CoasterStephanie Wolfe, MD [facility]([place] [facility]) ph [phone] fax [phone] ?Jonathan Risingina Goodpasture NP-C [facility]([place] [facility]) ph [phone] fax [phone] ?Jonathan BarleySarah Aldine Grainger, RN [facility]([place] [facility] Case Manager) ph [phone] fax [phone] ?Jonathan FannyAnne Peery, MD [facility]) ph. [phone] Fax [phone] ?Jonathan Kirby, RD/LDN [facility]) ph. [phone] fax [phone] ?Dentist: [facility] ([email or web address]) Jonathan Kirby DDS (Dr. Reece AgarG) ? ?Community support/services: ?[facility]: ph. [phone] Melissa holland RN [facility] ? ?Equipment/DME Supplies Providers ?[facility]: ph. [phone] fax [phone]  feeding supplies, pole, pump and back pack- not using pump almost pulled tube out when he forgot to grab the pump when he got up ?[employer] supplies- [employer] ? ?Goals of care: ? ?Advanced care planning: ? ? ?Psychosocial: ?Parental stress with seizure control, issues with gas ? ?Diagnostics/Screenings: ?08/28/2020 EEG: could be fragments of generalized epilepsy or multifocal epilepsy.  Additionally there is evidence of moderate diffuse encephalopathy, which could be part of patient's epileptic encephalo ?06/26/2021 GD with FLIP: findings suggestive of Type 1 achalasia. ?07/20/2021 CT of Abd, Pelvis & Chest:  no intraabdominal pathology.  RLQ ileostomy, no obstruction,  numerous decompressed small bowel loops with trace of mesenteric edema and engorged vasa recta which can occur with inflammatory/infectious enteritis ?07/23/2021 EGD with botox injection An area at the lower esophageal sphincter was injected with botox, 1 cm hiatal hernia was present, cardia and gastric fundus were normal on retroflexion, stomach was normal, examined duodenum was normal. No acute abnormality of the airways, lungs, pleura, or mediastinum. ?*Mild thickening of the distal esophagus is nonspecific but could be related to gastroesophageal reflux. ?10/15/2021 CT of Abd: Mild thickening of the distal esophagus which is nonspecific, however can be seen in the setting of esophagitis. 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 ?10/16/2021 EGD with Corpak placement: no evidence of GI bleeding to explain melena, h/h has remained stable Gastric oxyntic mucosa with mild chronic inactive gastritis. Negative for Helicobacter organisms on H&E stain. ?10/20/2021 U/S Liver Doppler: patent hepatic vasculature with normal flow direction. Cholelithiasis. ?10/23/2021 Liver Biopsy and PEG tube placement:  ? ? ?Jonathan Risingina Goodpasture NP-C and Jonathan CoasterStephanie Wolfe, MD ?Pediatric Complex Care Program ?Ph: [phone] ?Fax: [phone]  ?Did not tolerate a rate of 80 ml/hr, decreased to 65 ml/hr, now doing bolus feeds not pump over 30 min with the flushes ? ?Problem List ?As of 09/07/2021 10:54 AM ? ?Insomnia,  unspecified,   Misophonia, Moderate intellectual disabilities, Partial epilepsy with impairment of consciousness (HCC), Sleep arousal disorder, Abdominal distension ?Abdominal pain, Acute esophagitis, Acute metabolic encephalopathy, Aspiration pneumonia of both lower lobes due to gastric secretions (HCC), Autism spectrum disorder with accompanying language impairment and intellectual disability,  requiring very substantial support, Constipation, Decreased oral intake, Diplegia (HCC), Dysphagia, Generalized convulsive epilepsy (HCC) ?Hypokalemia, Hypomagnesemia, Ileus (HC, Intermittent explosive disorder, Malnutrition of moderate d

## 2022-01-14 NOTE — Progress Notes (Signed)
? ?Patient: Jonathan Kirby MRN: [id number] ?Sex: male DOB: 02/14/1986 ? ?Provider: Lorenz Coaster, MD ?Location of Care: [facility]- [facility] ?Note type: Routine return visit ? ? ?This is a Pediatric Specialist E-Visit follow up consult provided via MyChart ?Jonathan Kirby and their parent/guardian Jonathan Kirby consented to an E-Visit consult today.  ?Location of patient: [place] is at home in [place], [place] ?Location of provider: Lorenz Coaster, MD is at [facility] ? ?The following participants were involved in this E-Visit:  Jonathan Coaster, MD, Jonathan Kirby, RMA, Jonathan Kirby, patient, and their parent/guardian Jonathan Kirby ? ?This visit was done via VIDEO   ?  ?History of Present Illness: ?Referral Source: Jonathan Salts, MD ?History from: patient and prior records ?Chief Complaint: complex care follow-up ? ?Jonathan Kirby is a 36 y.o. male with history of history of  autism and focal epilepsy who I am seeing for urgent follow-up.  Patient was last seen on 12/24/21 where all neurology medications were continued at the same dose, but we discussed multiple strategies to improve presumed visceral hyperalgesia and dizziness causing agitation and sleep difficulty.  Since then, patient was seen in the ED on 01/10/22 for "hyperactivity" thought to be related to his Briviact.  ? ? ?Symptom management:  ? ?Mother reports that prior to the ED visit, he was sleeping well. On Friday afternoon, he started walking around more. Saturday, woke up early running around, worsened throughout the night.  "Out of sorts". Mom didn't give briviact (thought it was the cause).  Sunday, woke up again, rocking and head hurt. Gave low dose briviact, "got active again", noticed his heart was racing.  Given this change in activity, mother brought him to the emergency room.  Labwork was completed and negative, these were personally reviewed.  The ED provider agreed with mother that this may be medication effect  and recommended mother reach out to me.  ? ?Mother reports she brought him home and has not given him any more Briviact since  Instead,started him back on Felbatol she had at the house at 70ml BID.  Since then, has been "better".  She has now increased his Felbatol to 85ml twice daily.  Not as hyperactive, no seizure. Marland Kitchen  She would like to increase his dose further.   ? ?He has still been having difficulty with sleep.  Melatonin helped him fall asleep. Mother increased Prevacid as instructed for possible reflux during sleep, but she thought he was having more pain so stopped that as well.  ? ?Past Medical History ?Past Medical History:  ?Diagnosis Date  ? Autism   ? GERD (gastroesophageal reflux disease)   ? Seizures (HCC)   ? ? ?Surgical History ?Past Surgical History:  ?Procedure Laterality Date  ? CIRCUMCISION  1987  ? ESOPHAGOGASTRODUODENOSCOPY N/A 04/17/2020  ? Procedure: ESOPHAGOGASTRODUODENOSCOPY (EGD);  Surgeon: Pasty Spillers, MD;  Location[facility] ENDOSCOPY;  Service: Endoscopy;  Laterality: N/A;  ? ESOPHAGOGASTRODUODENOSCOPY (EGD) WITH PROPOFOL N/A 08/28/2020  ? Procedure: ESOPHAGOGASTRODUODENOSCOPY (EGD) WITH PROPOFOL;  Surgeon: Toney Reil, MD;  Location[facility] ENDOSCOPY;  Service: Gastroenterology;  Laterality: N/A;  ? ? ?Family History ?family history includes Alzheimer's disease in his maternal grandfather; Cancer in his maternal grandmother; Stroke in his paternal grandfather. ? ? ?Social History ?Social History  ? ?Social History Narrative  ? Jonathan Kirby does not attend any school or day program at this time.   ? He lives with his parents.   ? He enjoys listening to music, playing  with remote control cars, and collecting newspapers.  ? ? ?Allergies ?Allergies  ?Allergen Reactions  ? Lactose Intolerance (Gi)   ?  Upset stomach, excessive gas  ? Vimpat [Lacosamide]   ?  Elevated liver enzymes after starting TPN & Vimpat  ? ? ?Medications ?Current Outpatient Medications on File Prior to Visit   ?Medication Sig Dispense Refill  ? diphenhydrAMINE HCl (BENADRYL ALLERGY PO) Take 25 mg by mouth at bedtime.    ? famotidine (PEPCID) 40 MG tablet Take 1 tablet (40 mg total) by mouth at bedtime. (Patient taking differently: Take 10 mg by mouth at bedtime.) 30 tablet 3  ? Multiple Vitamins-Minerals (CENTRUM MULTIGUMMIES) CHEW Chew 2 Pieces by mouth daily.    ? pantoprazole sodium (PROTONIX) 40 mg Take 40 mg by mouth daily.    ? Acetaminophen Childrens 160 MG CHEW Chew 480 mg by mouth every 6 (six) hours as needed. (Patient not taking: Reported on 01/14/2022)    ? hydrOXYzine (VISTARIL) 25 MG capsule Give 1 capsule at bedtime (Patient not taking: Reported on 12/24/2021) 30 capsule 0  ? melatonin 1 MG TABS tablet Take 1 mg by mouth at bedtime as needed. (Patient not taking: Reported on 01/14/2022)    ? simethicone (MYLICON) 40 MG/0.6ML drops Take 40 mg by mouth every 6 (six) hours as needed. (Patient not taking: Reported on 01/14/2022)    ? ?No current facility-administered medications on file prior to visit.  ? ?The medication list was reviewed and reconciled. All changes or newly prescribed medications were explained.  A complete medication list was provided to the patient/caregiver. ? ?Physical Exam ?Wt 113 lb (51.3 kg)   BMI 18.80 kg/m?  ?Weight for age: Facility age limit for growth percentiles is 20 years.  ?Length for age: Facility age limit for growth percentiles is 20 years. ?BMI: Body mass index is 18.8 kg/m?Marland Kitchen ?No results found. ?General: well appearing young man sitting in bed ?HEENT: normocephalic, no eye or nose discharge.  MMM  ?Lungs: Normal work of breathing, no rhonchi or stridor heard over video ?Neuro: Awakem alert. Face symmetric. Moves all extremities equally and at least antigravity. No abnormal movements.  ? ? ?Diagnosis:  ?Problem List Items Addressed This Visit   ? ?  ? Digestive  ? Ileus (HCC)  ?  ? Nervous and Auditory  ? Partial epilepsy with impairment of consciousness (HCC) - Primary  (Chronic)  ? Relevant Medications  ? felbamate (FELBATOL) 600 MG/5ML suspension  ? clonazePAM (KLONOPIN) 0.25 MG disintegrating tablet  ? diazePAM, 20 MG Dose, (VALTOCO 20 MG DOSE) 2 x 10 MG/0.1ML LQPK  ? Other Relevant Orders  ? Hepatic function panel  ? Generalized convulsive epilepsy (HCC)  ? Relevant Medications  ? felbamate (FELBATOL) 600 MG/5ML suspension  ? clonazePAM (KLONOPIN) 0.25 MG disintegrating tablet  ? diazePAM, 20 MG Dose, (VALTOCO 20 MG DOSE) 2 x 10 MG/0.1ML LQPK  ?  ? Other  ? Insomnia, unspecified (Chronic)  ? Elevated liver enzymes  ? ? ?Assessment and Plan ?PHUC KLUTTZ is a 36 y.o. 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  I discussed with mother about a plan at length and decided on the following.  ? ?Continue Felbatol 25ml twice daily.  New prescription sent today.  ?Recommend labwork in 2 weeks.  He can  come to our office on Thursdays to get bloodwork drawn, or you can check with your PCP.  ?Klonopin is NOT to stop seizures in the moment. Instead we are providing Valtoco spray.  Seizures are likely to stop on their own, but if they do not stop within 2-3 minutes, ok to give Valtoco ? You can still give Klonopin every 8 hours after a seizure to prevent further seizures.  This will make him sleepy if you give it with Valtoco.  To limit sleepiness, recommend starting the Klonopin 4 hours after Valtoco.  ? ?Follow-up with Inetta Fermoina in 2-3 weeks, after labwork is drawn.  Plan to discuss increasing Felbamate at that appointment, and consider medications to help with sleep (Vistaril, Gabapentin, or another seizure medication to improve seizures and sleep)  ? ?The CARE PLAN for reviewed and revised to represent the changes above.  This is available in Epic under snapshot, and a  physical binder provided to the patient, that can be used for anyone providing care for the patient.  ? ?Return in about 2 weeks (around 01/28/2022). ? ? ?I spend 45 minutes on day of service on this patient including

## 2022-01-18 ENCOUNTER — Encounter (INDEPENDENT_AMBULATORY_CARE_PROVIDER_SITE_OTHER): Payer: Self-pay

## 2022-01-22 ENCOUNTER — Encounter (INDEPENDENT_AMBULATORY_CARE_PROVIDER_SITE_OTHER): Payer: Self-pay | Admitting: Pediatrics

## 2022-01-29 ENCOUNTER — Ambulatory Visit: Admit: 2022-01-29 | Discharge: 2022-01-30 | Payer: MEDICARE

## 2022-01-29 DIAGNOSIS — L929 Granulomatous disorder of the skin and subcutaneous tissue, unspecified: Principal | ICD-10-CM

## 2022-01-29 DIAGNOSIS — K580 Irritable bowel syndrome with diarrhea: Principal | ICD-10-CM

## 2022-01-29 LAB — AST: AST: 32 U/L (ref 10–40)

## 2022-01-29 LAB — ALKALINE PHOSPHATASE: Alkaline phosphatase (APISO): 122 U/L (ref 36–130)

## 2022-01-29 LAB — ALT: ALT: 48 U/L — ABNORMAL HIGH (ref 9–46)

## 2022-01-29 MED ORDER — RIFAXIMIN 20 MG/ML ORAL SUSPENSION WAM
Freq: Three times a day (TID) | GASTROSTOMY | 0 refills | 15 days | Status: CP
Start: 2022-01-29 — End: 2022-02-12

## 2022-01-29 MED ORDER — SILVER NITRATE APPLICATORS 75 %-25 % TOPICAL STICK
TOPICAL | 0 refills | 70 days | Status: CP
Start: 2022-01-29 — End: 2022-02-05

## 2022-02-01 ENCOUNTER — Encounter (INDEPENDENT_AMBULATORY_CARE_PROVIDER_SITE_OTHER): Payer: Self-pay | Admitting: Pediatrics

## 2022-02-01 ENCOUNTER — Telehealth (INDEPENDENT_AMBULATORY_CARE_PROVIDER_SITE_OTHER): Payer: Medicare Other | Admitting: Family

## 2022-02-01 ENCOUNTER — Encounter (INDEPENDENT_AMBULATORY_CARE_PROVIDER_SITE_OTHER): Payer: Self-pay | Admitting: Family

## 2022-02-01 VITALS — Wt 113.0 lb

## 2022-02-01 DIAGNOSIS — G40309 Generalized idiopathic epilepsy and epileptic syndromes, not intractable, without status epilepticus: Secondary | ICD-10-CM

## 2022-02-01 DIAGNOSIS — G40209 Localization-related (focal) (partial) symptomatic epilepsy and epileptic syndromes with complex partial seizures, not intractable, without status epilepticus: Secondary | ICD-10-CM | POA: Diagnosis not present

## 2022-02-01 DIAGNOSIS — G478 Other sleep disorders: Secondary | ICD-10-CM | POA: Diagnosis not present

## 2022-02-01 DIAGNOSIS — R1310 Dysphagia, unspecified: Secondary | ICD-10-CM

## 2022-02-01 DIAGNOSIS — R748 Abnormal levels of other serum enzymes: Secondary | ICD-10-CM

## 2022-02-01 DIAGNOSIS — Z79899 Other long term (current) drug therapy: Secondary | ICD-10-CM

## 2022-02-01 DIAGNOSIS — G4700 Insomnia, unspecified: Secondary | ICD-10-CM

## 2022-02-01 DIAGNOSIS — F840 Autistic disorder: Secondary | ICD-10-CM | POA: Diagnosis not present

## 2022-02-01 DIAGNOSIS — F71 Moderate intellectual disabilities: Secondary | ICD-10-CM

## 2022-02-01 NOTE — Progress Notes (Signed)
[facility]   MRN:  [id number]  06-02-1986   This is a Pediatric Specialist E-Visit consult/follow up provided via My [place] and his mother Chato Laatsch consented to an E-Visit consult today.  Location of patient: [place] [facility] Location of provider: Normand Sloop is at office Patient was referred by Alvester Chou, NP   The following participants were involved in this E-Visit: CMA, NP, patient and his mother   This visit was done via [place] Complain/ Reason for E-Visit today: seizure follow up Total time on call: 20 minutes Follow up: 1 month  Provider: Rockwell Germany NP-C Location of Care: [facility]  Visit type: Return visit by video  Last visit: 01/14/2022 with Dr Rogers Blocker  Referral source: Alvester Chou, NP History from: Epic chart and patient's mother  Brief history:  Copied from previous record: History of autism spectrum disorder with intellectual disability and severe mixed language disorder. 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 He is taking and tolerating Briviact for seizures. 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 He has had problems with feeding intolerance and swallowing, and received TPN at [facility] via a PICC line for over a year.    [facility] was seen by GI at [facility] and a PEG tube was recommended for feeding as he has had elevated liver enzymes and ongoing problems with feeding. He underwent the procedure and a liver biopsy procedure in late January 2023 and did well with that.    [facility] has problems with sleep and generally does not sleep all night. Mom is trying low dose Melatonin but finds that [facility] is groggy the following morning.   Today's concerns: [facility] is seen today in follow up from recent visit with Dr Rogers Blocker where he  had experienced seizures and Mom had stopped giving the Briviact and restarted Felbamate. She reports that he has been seizure free since the last visit. She says that sleep is still a problem. She gave Benadryl at bedtime for 1 week, then switched to Melatonin. He is now taking Melatonin 4mg  at bedtime and things have improved slightly. Mom notes that his appetite is better and that he has been eating more foods by mouth than he was doing. [facility] saw his GI provider last week and was switched back to Protonix from Pepcid. She reports that he is irritable at times and that she hasn't identified a trigger for that.  Mom reports that he is not receiving Klonopin now but that she has questions to clarify when it should be given. She is pleased that he has remained seizure free on the Felbamate. She reports that she and Dad discussed Dr Shelby Mattocks recommendation to be willing to try newer anticonvulsant medications that are now available and that they are open to that. She admits that [facility]'s seizures frighten her because of how severe they were when he was a small child.   [facility] had labs done to check liver enzymes and Mom is interested in receiving those results. He has been otherwise generally healthy since he was last seen. Mom has no other health concerns for him today other than previously mentioned.  Review of systems: Please see HPI for neurologic and other pertinent review of systems. Otherwise all other systems were reviewed and were negative.  Problem List: Patient Active Problem List  Diagnosis Date Noted   PEG (percutaneous endoscopic gastrostomy) status ([place]) 12/01/2021   Elevated liver enzymes 11/01/2021   Ileostomy in place [facility]) 09/17/2021   Diplegia ([place]) 05/08/2021   Abdominal distension    Ileus ([place])    Malnutrition of moderate degree 08/28/2020   Aspiration pneumonia of both lower lobes due to gastric secretions ([place]) 08/26/2020   Severe protein-calorie malnutrition Altamease Oiler: less  than 60% of standard weight) ([place]) 99991111   Acute metabolic encephalopathy [id number]   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   Abdominal pain 08/21/2020   Hypokalemia 08/21/2020   Hypomagnesemia 08/21/2020   Acute esophagitis    Seizure disorder ([place])    Constipation 04/14/2020   Decreased oral intake 04/14/2020   Dysphagia 04/14/2020   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   Autism spectrum disorder with accompanying language impairment and intellectual disability, requiring very substantial support 07/23/2014   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  These were quite frequent, and caused significant impairment in his function.  Once we were able to bring seizures under control Felbatol, he showed a great deal of anxiety and depression.  This has improved as he has become older.  For the most part his seizures have been well controlled on Felbatol.  Most recurrent seizures have occurred in the setting of forgotten doses of medication.  He also has problems with constipation.   Behavior History autism spectrum disorder, level 3; he becomes aggressive when he is anxious and with changes in routine.  Surgical history: Past Surgical History:  Procedure Laterality Date   CIRCUMCISION  1987   ESOPHAGOGASTRODUODENOSCOPY N/A 04/17/2020   Procedure: ESOPHAGOGASTRODUODENOSCOPY (EGD);  Surgeon: Virgel Manifold, MD;  Location: [facility] ENDOSCOPY;   Service: Endoscopy;  Laterality: N/A;   ESOPHAGOGASTRODUODENOSCOPY (EGD) WITH PROPOFOL N/A 08/28/2020   Procedure: ESOPHAGOGASTRODUODENOSCOPY (EGD) WITH PROPOFOL;  Surgeon: Lin Landsman, MD;  Location: [facility] ENDOSCOPY;  Service: Gastroenterology;  Laterality: N/A;     Family history: family history includes Alzheimer's disease in his maternal grandfather; Cancer in his maternal grandmother; Stroke in his paternal grandfather.   Social history: Social History   Socioeconomic History   Marital status: Single    Spouse name: Not on file   Number of children: Not on file   Years of education: Not on file   Highest education level: Not on file  Occupational History   Not on file  Tobacco Use   Smoking status: Never    Passive exposure: Never   Smokeless tobacco: Never  Substance and Sexual Activity   Alcohol use: No    Alcohol/week: 0.0 standard drinks   Drug use: No   Sexual activity: Never  Other Topics Concern   Not on file  Social History Narrative   [facility] does not attend any school or day program at this time.    He lives with his parents.    He enjoys listening to music, playing with remote control cars, and collecting newspapers.   Social Determinants of Health  Financial Resource Strain: Not on file  Food Insecurity: Not on file  Transportation Needs: Not on file  Physical Activity: Not on file  Stress: Not on file  Social Connections: Not on file  Intimate Partner Violence: Not on file   Past/failed meds: Copied from previous record: Phenobarbital, Tegretol, Dilantin, Depakote, Mysoline, Celontin Vimpat - elevated liver enzymes Felbamate - elevated liver enzymes but Mom chose to restart in April 2023 Briviact - did not control seizures  Allergies: Allergies  Allergen Reactions   Lactose Intolerance (Gi)     Upset stomach, excessive gas   Vimpat [Lacosamide]     Elevated liver enzymes after starting TPN & Vimpat   Immunizations:  There is no  immunization history on file for this patient.   Diagnostics/Screenings: Copied from previous record: 08/24/2020 - CT head wo contrast - 1.  No acute intracranial abnormality. 2. Nonspecific volume loss involving the cerebellum and possibly also the brainstem.   08/28/2020 - rEEG - This study is consistent with patient's known history of epilepsy, which could be fragments of generalized epilepsy  or multifocal epilepsy.  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 Lora Havens   Physical Exam: Wt 113 lb (51.3 kg)   BMI 18.80 kg/m   General: well developed, well nourished man, seated at [facility] with his mother, in no evident distress Head: normocephalic and atraumatic. No dysmorphic features. Neck: supple Musculoskeletal: no skeletal deformities or obvious scoliosis. Skin: no rashes or neurocutaneous lesions  Neurologic Exam Mental Status: awake and fully alert. Has no language. Hides his face behind his hands for the entire visit. Cranial Nerves: turns to localize faces and objects in the periphery. Turns to localize sounds in the periphery. Facial movements are symmetric. Motor: normal functional bulk, tone and strength Sensory: withdrawal x 4 Coordination: unable to adequately assess due to patient's inability to participate in examination. No dysmetria when reaching for objects.  Impression: Partial epilepsy with impairment of consciousness ([place]) - Plan: ALT, ALT  Elevated liver enzymes - Plan: ALT, ALT  Encounter for long-term (current) use of high-risk medication - Plan: ALT, ALT  Generalized convulsive epilepsy ([place])  Dysphagia, unspecified type  Sleep arousal disorder  Autism spectrum disorder with accompanying language impairment and intellectual disability, requiring very substantial support  Moderate intellectual disabilities  Insomnia, unspecified type    Recommendations for plan  of care: The patient's previous Epic records were reviewed. Deljuan has neither had nor required imaging studies since the last visit. He had lab studies which I reviewed with Mom today. The liver enzymes remain slightly elevated but are stable. I talked with Mom about the Felbamate and instructed her not to increase the dose further. If Matix has more seizures, we will need to consider a new anticonvulsant medication. I also reviewed with Mom when to give the Clonazepam and when Valtoco should be given. We talked about his sleep and I recommended continuing to administer the Melatonin. I will see [facility] back in follow up in 1 month or sooner if needed. We will recheck his liver enzymes at that time.   The medication list was reviewed and reconciled. No changes were made in the prescribed medications today. A complete medication list was provided to the patient.  Orders Placed This Encounter  Procedures   ALT    Standing Status:   Future    Standing Expiration Date:   05/04/2022    Return in about 1 month (around  03/04/2022).   Allergies as of 02/01/2022       Reactions   Lactose Intolerance (gi)    Upset stomach, excessive gas   Vimpat [lacosamide]    Elevated liver enzymes after starting TPN & Vimpat        Medication List        Accurate as of Feb 01, 2022 11:59 PM. If you have any questions, ask your nurse or doctor.          STOP taking these medications    famotidine 40 MG tablet Commonly known as: Pepcid Stopped by: Rockwell Germany, NP       TAKE these medications    Acetaminophen Childrens 160 MG Chew Chew 480 mg by mouth every 6 (six) hours as needed.   BENADRYL ALLERGY PO Take 25 mg by mouth at bedtime.   calcium carbonate 750 MG chewable tablet Commonly known as: TUMS EX Chew by mouth.   Centrum MultiGummies Chew Chew 2 Pieces by mouth daily.   clonazePAM 0.25 MG disintegrating tablet Commonly known as: KLONOPIN Place 1 tablet dissolve in water and  put through the tube, every 8 hours for 24 hours when seizures occur.   felbamate 600 MG/5ML suspension Commonly known as: FELBATOL Take 7 mLs (840 mg total) by mouth 2 (two) times daily.   hydrOXYzine 25 MG capsule Commonly known as: VISTARIL Give 1 capsule at bedtime   melatonin 1 MG Tabs tablet Take 1 mg by mouth at bedtime as needed.   pantoprazole sodium 40 mg Commonly known as: PROTONIX Take 40 mg by mouth daily.   simethicone 40 MG/0.6ML drops Commonly known as: MYLICON Take 40 mg by mouth every 6 (six) hours as needed.   Valtoco 20 MG Dose 2 x 10 MG/0.1ML Lqpk Generic drug: diazePAM (20 MG Dose) Place 20 mg into the nose as needed (seizure longer than 2-3 minutes).      I discussed this patient's care with Dr Rogers Blocker.   Total time spent with the patient was 20 minutes, of which 50% or more was spent in counseling and coordination of care.  Rockwell Germany NP-C [place] [facility] Ph. [phone] Fax [phone]

## 2022-02-02 DIAGNOSIS — Z79899 Other long term (current) drug therapy: Secondary | ICD-10-CM | POA: Insufficient documentation

## 2022-02-02 NOTE — Patient Instructions (Signed)
It was a pleasure to see you today! ? ?Instructions for you until your next appointment are as follows: ?Continue giving the medications as prescribed.  ?We will recheck the liver enzymes in 1 month ?It is ok to continue the Melatonin 4mg  at bedtime to help with sleep ?Continue the Protonix as his GI doctor instructed ?Let me know if [facility] has any seizures or if you have any concerns ?The Valtoco nasal spray is to be given for seizures lasting longer than 2-3 minutes ?The Clonazepam (Klonopin) 0.25mg  is to be given if he has clusters of seizures. Give 1 tablet under the tongue, inside the check or dissolved and in his g-tube every 8 hours for 24 hours after a cluster of seizures occur. ?Please sign up for MyChart if you have not done so. ?Please plan to return for follow up in 1 month or sooner if needed. ?  ?Feel free to contact our office during normal business hours at [phone] with questions or concerns. If there is no answer or the call is outside business hours, please leave a message and our clinic staff will call you back within the next business day.  If you have an urgent concern, please stay on the line for our after-hours answering service and ask for the on-call neurologist.   ?  ?I also encourage you to use MyChart to communicate with me more directly. If you have not yet signed up for MyChart within [facility], the front desk staff can help you. However, please note that this inbox is NOT monitored on nights or weekends, and response can take up to 2 business days.  Urgent matters should be discussed with the on-call pediatric neurologist.  ? ?At [facility], we are committed to providing exceptional care. You will receive a patient satisfaction survey through text or email regarding your visit today. Your opinion is important to me. Comments are appreciated.   ?

## 2022-02-03 ENCOUNTER — Telehealth: Admit: 2022-02-03 | Discharge: 2022-02-04 | Payer: MEDICARE | Attending: Registered" | Primary: Registered"

## 2022-02-05 ENCOUNTER — Encounter (INDEPENDENT_AMBULATORY_CARE_PROVIDER_SITE_OTHER): Payer: Self-pay | Admitting: Family

## 2022-02-16 ENCOUNTER — Ambulatory Visit
Admit: 2022-02-16 | Discharge: 2022-02-17 | Payer: MEDICARE | Attending: Diagnostic Radiology | Primary: Diagnostic Radiology

## 2022-02-16 DIAGNOSIS — Z789 Other specified health status: Principal | ICD-10-CM

## 2022-02-19 ENCOUNTER — Encounter (INDEPENDENT_AMBULATORY_CARE_PROVIDER_SITE_OTHER): Payer: Self-pay

## 2022-02-21 ENCOUNTER — Ambulatory Visit: Admit: 2022-02-21 | Payer: MEDICARE

## 2022-02-21 ENCOUNTER — Encounter: Admit: 2022-02-21 | Payer: MEDICARE

## 2022-02-21 ENCOUNTER — Encounter: Admit: 2022-02-21 | Discharge: 2022-03-01 | Payer: MEDICARE | Attending: Certified Registered"

## 2022-02-21 ENCOUNTER — Ambulatory Visit: Admit: 2022-02-21 | Discharge: 2022-03-01 | Disposition: A | Discharge status: Home / Self Care | Payer: MEDICARE

## 2022-02-21 ENCOUNTER — Encounter: Admit: 2022-02-21 | Discharge: 2022-03-01 | Payer: MEDICARE

## 2022-03-01 DIAGNOSIS — K580 Irritable bowel syndrome with diarrhea: Principal | ICD-10-CM

## 2022-03-01 MED ORDER — OXYCODONE 5 MG/5 ML ORAL SOLUTION
0 refills | 0 days | Status: CP
Start: 2022-03-01 — End: ?
  Filled 2022-03-01: qty 120, 3d supply, fill #0

## 2022-03-01 MED ORDER — RIFAXIMIN 550 MG TABLET
ORAL_TABLET | Freq: Three times a day (TID) | ORAL | 0 refills | 20.00000 days | Status: CP
Start: 2022-03-01 — End: 2022-03-21

## 2022-03-10 NOTE — Progress Notes (Unsigned)
This is a Pediatric Specialist E-Visit consult/follow up provided via My Chart Jonathan Kirby and his mother [name] consented to an E-Visit consult today.  Location of patient: [name] is at home Location of provider: Elveria Rising, NP-C is at office Patient was referred by Marletta Lor, NP   The following participants were involved in this E-Visit: CMA, NP, patient's mother   This visit was done via VIDEO   Chief Complain/ Reason for E-Visit today: *** Total time on call: *** Follow up: ***   Jonathan Kirby   MRN:  [id number]  11/04/1985   Provider: Elveria Rising NP-C Location of Care: [facility] Child Neurology and Pediatric Complex Care  Visit type: Video return visit  Last visit: 02/01/2022  Referral source: Marletta Lor, NP  History from: Epic chart, Care Everywhere and patient's mother  Brief history:  Copied from previous record: History of autism spectrum disorder with intellectual disability and severe mixed language disorder. 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 He is taking and tolerating Briviact for seizures. 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 He has had problems with feeding intolerance and swallowing, and received TPN at home via a PICC line for over a year.    [name] was seen by GI at [facility] and a PEG tube was recommended for feeding as he has had elevated liver enzymes and ongoing problems with feeding. He underwent the procedure and a liver biopsy procedure in late January 2023 and did well with that.    [name] has problems with sleep and generally does not sleep all night. Mom is trying low dose Melatonin but finds that Andron is groggy the following morning.   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 He has had multiple readmissions and surgeries for management of recurrent SBOs (lysis of adhesions in 10/2020, diagnostic laparoscopy with omentectomy in 02/2021). The patient was transferred from [facility] ED after presenting with decreased ostomy output for management of high grade SBO seen on CT scan.  Gastrostomy tube was placed to straight drain for decompression and he was kept NPO. Creatinine was elevated to 1.17 at time of admission and normalized with IVF. The began to have intermittent gas and stool output from ileostomy while still in the ED. He remained NPO and on IVF and his stoma was intubated with a catheter to help with decompression, which did little. The decision was made to pursue surgery.  [name] underwent diagnostic laparoscopy on 02/23/2022. He tolerated the procedure well, was extubated in the OR, and was taken to the PACU and received routine postoperative care before being transferred to the floor. No evidence of obstruction was found intraoperatively. There was a mild stricture at the level of the stoma, which was not obstructive and local simple stoma revision was done (scar released and ostomy resutured to the dermis. Gastroenterology was consulted for recurrent obstructive symptoms in the absence of objective operative findings. GI recommended trial of rifaxamin 550 mg tid for CIPO. He will continue this for a total of 2 weeks and will follow up with Dr. Carmon Sails outpatient.   His diet was slowly advanced and at the time of discharge, he was tolerating a regular diet. Tube feeds were restarted at at low continuous rate of 20  ml/hr, continued at 20 ml/hr for an addition day, as recommended by GI, and then advanced to home bolus regimen on 6/11. G tube hypergranulation tissue was treated x 2 with silver nitrate. Pain medications were transitioned to oral without difficulty. He is being  discharged in stable condition on 03/01/2022.  *** has been otherwise generally healthy since he was last seen. Neither *** nor mother have other health concerns for *** today other than previously mentioned.   Review of systems: Please see HPI for neurologic and other pertinent review of systems. Otherwise all other systems were reviewed and were negative.  Problem List: Patient Active Problem List   Diagnosis Date Noted   Encounter for long-term (current) use of high-risk medication 02/02/2022   PEG (percutaneous endoscopic gastrostomy) status ([place]) 12/01/2021   Elevated liver enzymes 11/01/2021   Ileostomy in place ([place]) 09/17/2021   Diplegia ([place]) 05/08/2021   Abdominal distension    Ileus (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   Autism spectrum disorder with accompanying language impairment and intellectual disability, requiring very substantial support 07/23/2014   Generalized convulsive epilepsy ([place]) 04/02/2013   Partial epilepsy with impairment of consciousness ([place]) 04/02/2013   Moderate intellectual disabilities 04/02/2013   Insomnia, unspecified 04/02/2013   Intermittent explosive disorder 04/02/2013   Mental and behavioral problem 1986/06/05     Past Medical History:  Diagnosis Date   Autism    GERD (gastroesophageal reflux disease)    Seizures (HCC)     Past medical history comments: See HPI Copied from previous record: He  had a mixture of complex partial seizures with secondary generalization, and myoclonic seizures.  These were quite frequent, and caused significant impairment in his function.  Once we were able to bring seizures under control Felbatol, he showed a great deal of anxiety and depression.  This has improved as he has become older.  For the most part his seizures have been well controlled on Felbatol.  Most recurrent seizures have occurred in the setting of forgotten doses of medication.  He also has problems with constipation.   Behavior History autism spectrum disorder, level 3; he becomes aggressive when he is anxious and with changes in routine.  Surgical history: Past Surgical History:  Procedure Laterality Date   CIRCUMCISION  1987   ESOPHAGOGASTRODUODENOSCOPY N/A 04/17/2020   Procedure: ESOPHAGOGASTRODUODENOSCOPY (EGD);  Surgeon: Virgel Manifold, MD;  Location[facility] ENDOSCOPY;  Service: Endoscopy;  Laterality: N/A;   ESOPHAGOGASTRODUODENOSCOPY (EGD) WITH PROPOFOL N/A 08/28/2020   Procedure: ESOPHAGOGASTRODUODENOSCOPY (EGD) WITH PROPOFOL;  Surgeon: Lin Landsman, MD;  Location[facility] ENDOSCOPY;  Service: Gastroenterology;  Laterality: N/A;     Family history: family history includes Alzheimer's disease in his maternal grandfather; Cancer in his maternal grandmother; Stroke in his paternal grandfather.   Social history: Social History   Socioeconomic History   Marital status: Single    Spouse name: Not on file   Number of children: Not on file   Years of education: Not on  file   Highest education level: Not on file  Occupational History   Not on file  Tobacco Use   Smoking status: Never    Passive exposure: Never   Smokeless tobacco: Never  Substance and Sexual Activity   Alcohol use: No    Alcohol/week: 0.0 standard drinks of alcohol   Drug use: No   Sexual activity: Never  Other Topics Concern   Not on file  Social History Narrative   Sigmond does not attend any  school or day program at this time.    He lives with his parents.    He enjoys listening to music, playing with remote control cars, and collecting newspapers.   Social Determinants of Health   Financial Resource Strain: Not on file  Food Insecurity: Not on file  Transportation Needs: Not on file  Physical Activity: Not on file  Stress: Not on file  Social Connections: Not on file  Intimate Partner Violence: Not on file    Past/failed meds: Copied from previous record: Phenobarbital, Tegretol, Dilantin, Depakote, Mysoline, Celontin Vimpat - elevated liver enzymes Felbamate - elevated liver enzymes but Mom chose to restart in April 2023 Briviact - did not control seizures  Allergies: Allergies  Allergen Reactions   Lactose Intolerance (Gi)     Upset stomach, excessive gas   Vimpat [Lacosamide]     Elevated liver enzymes after starting TPN & Vimpat      Immunizations:  There is no immunization history on file for this patient.    Diagnostics/Screenings: Copied from previous record: 08/24/2020 - CT head wo contrast - 1.  No acute intracranial abnormality. 2. Nonspecific volume loss involving the cerebellum and possibly also the brainstem.   08/28/2020 - rEEG - This study is consistent with patient's known history of epilepsy, which could be fragments of generalized epilepsy  or multifocal epilepsy.  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 Lora Havens   Physical Exam: There were no vitals taken for this visit.  General: well developed, well nourished, seated, in no evident distress Head: microcephalic and atraumatic. Oropharynx benign. No dysmorphic features. Neck: supple Cardiovascular: regular rate and rhythm, no murmurs. Respiratory: clear to auscultation bilaterally Abdomen: bowel sounds present all four quadrants, abdomen soft, non-tender, non-distended. No hepatosplenomegaly  or masses palpated.Gastrostomy tube in place ***size Musculoskeletal: no skeletal deformities or obvious scoliosis. Has contractures**** Skin: no rashes or neurocutaneous lesions  Neurologic Exam Mental Status: awake and fully alert. Has no language.  Smiles responsively. Resistant to invasions into ***space Cranial Nerves: fundoscopic exam - red reflex present.  Unable to fully visualize fundus.  Pupils equal briskly reactive to light.  Turns to localize faces and objects in the periphery. Turns to localize sounds in the periphery. Facial movements are asymmetric, has lower facial weakness with drooling.  Neck flexion and extension *** abnormal with poor head control.  Motor: truncal hypotonia.  *** spastic quadriparesis  Sensory: withdrawal x 4 Coordination: unable to adequately assess due to patient's inability to participate in examination. No dysmetria when reaching for objects. Gait and Station: unable to independently stand and bear weight. Able to stand with assistance but needs constant support. Able to take a few steps but has poor balance and needs support.  Reflexes: diminished and symmetric. Toes neutral. No clonus   Impression: No diagnosis found.    Recommendations for plan of care: The patient's previous Epic records were reviewed. Jacksen has neither had nor required imaging  or lab studies since the last visit.   The medication list was reviewed and reconciled. No changes were made in the prescribed medications today. A complete medication list was provided to the patient.  No orders of the defined types were placed in this encounter.   No follow-ups on file.   Allergies as of 03/11/2022       Reactions   Lactose Intolerance (gi)    Upset stomach, excessive gas   Vimpat [lacosamide]    Elevated liver enzymes after starting TPN & Vimpat        Medication List        Accurate as of March 10, 2022  8:33 PM. If you have any questions, ask your nurse or doctor.           Acetaminophen Childrens 160 MG Chew Chew 480 mg by mouth every 6 (six) hours as needed.   BENADRYL ALLERGY PO Take 25 mg by mouth at bedtime.   calcium carbonate 750 MG chewable tablet Commonly known as: TUMS EX Chew by mouth.   Centrum MultiGummies Chew Chew 2 Pieces by mouth daily.   clonazePAM 0.25 MG disintegrating tablet Commonly known as: KLONOPIN Place 1 tablet dissolve in water and put through the tube, every 8 hours for 24 hours when seizures occur.   felbamate 600 MG/5ML suspension Commonly known as: FELBATOL Take 7 mLs (840 mg total) by mouth 2 (two) times daily.   hydrOXYzine 25 MG capsule Commonly known as: VISTARIL Give 1 capsule at bedtime   melatonin 1 MG Tabs tablet Take 1 mg by mouth at bedtime as needed.   pantoprazole sodium 40 mg Commonly known as: PROTONIX Take 40 mg by mouth daily.   simethicone 40 MG/0.6ML drops Commonly known as: MYLICON Take 40 mg by mouth every 6 (six) hours as needed.   Valtoco 20 MG Dose 2 x 10 MG/0.1ML Lqpk Generic drug: diazePAM (20 MG Dose) Place 20 mg into the nose as needed (seizure longer than 2-3 minutes).      I discussed this patient's care with the multiple providers involved in his care today to develop this assessment and plan.   Total time spent with the patient was *** minutes, of which 50% or more was spent in counseling and coordination of care.  Elveria Rising NP-C [facility] Child Neurology and Pediatric Complex Care Ph. [phone] Fax [phone]

## 2022-03-11 ENCOUNTER — Telehealth (INDEPENDENT_AMBULATORY_CARE_PROVIDER_SITE_OTHER): Payer: Medicare Other | Admitting: Family

## 2022-03-11 ENCOUNTER — Encounter (INDEPENDENT_AMBULATORY_CARE_PROVIDER_SITE_OTHER): Payer: Self-pay | Admitting: Family

## 2022-03-11 VITALS — Wt 112.0 lb

## 2022-03-11 DIAGNOSIS — Z931 Gastrostomy status: Secondary | ICD-10-CM

## 2022-03-11 DIAGNOSIS — F840 Autistic disorder: Secondary | ICD-10-CM | POA: Diagnosis not present

## 2022-03-11 DIAGNOSIS — G40209 Localization-related (focal) (partial) symptomatic epilepsy and epileptic syndromes with complex partial seizures, not intractable, without status epilepticus: Secondary | ICD-10-CM

## 2022-03-11 DIAGNOSIS — F5105 Insomnia due to other mental disorder: Secondary | ICD-10-CM | POA: Diagnosis not present

## 2022-03-11 DIAGNOSIS — G40309 Generalized idiopathic epilepsy and epileptic syndromes, not intractable, without status epilepticus: Secondary | ICD-10-CM

## 2022-03-11 DIAGNOSIS — K5989 Other specified functional intestinal disorders: Secondary | ICD-10-CM

## 2022-03-11 DIAGNOSIS — R748 Abnormal levels of other serum enzymes: Secondary | ICD-10-CM

## 2022-03-11 DIAGNOSIS — F71 Moderate intellectual disabilities: Secondary | ICD-10-CM

## 2022-03-11 MED ORDER — FELBAMATE 600 MG/5ML PO SUSP: 965.0000 mg | Freq: Two times a day (BID) | ORAL | 5 refills | Status: DC

## 2022-03-11 MED ORDER — MELATONIN 1 MG PO TABS: 3.0000 mg | ORAL_TABLET | Freq: Every evening | ORAL | 5 refills | Status: DC | PRN

## 2022-03-11 NOTE — Patient Instructions (Signed)
It was a pleasure to see you today!  Instructions for you until your next appointment are as follows: Continue Hersh's medications as prescribed.  If he has more seizures, let me know.  Be sure to follow up with GI regarding his weight and calorie needs Please sign up for MyChart if you have not done so. Please plan to return for follow up with Dr Artis Flock in July as scheduled or sooner if needed.   Feel free to contact our office during normal business hours at [phone] with questions or concerns. If there is no answer or the call is outside business hours, please leave a message and our clinic staff will call you back within the next business day.  If you have an urgent concern, please stay on the line for our after-hours answering service and ask for the on-call neurologist.     I also encourage you to use MyChart to communicate with me more directly. If you have not yet signed up for MyChart within [facility], the front desk staff can help you. However, please note that this inbox is NOT monitored on nights or weekends, and response can take up to 2 business days.  Urgent matters should be discussed with the on-call pediatric neurologist.   At [facility], we are committed to providing exceptional care. You will receive a patient satisfaction survey through text or email regarding your visit today. Your opinion is important to me. Comments are appreciated.

## 2022-03-14 ENCOUNTER — Encounter (INDEPENDENT_AMBULATORY_CARE_PROVIDER_SITE_OTHER): Payer: Self-pay | Admitting: Family

## 2022-03-14 DIAGNOSIS — K5989 Other specified functional intestinal disorders: Secondary | ICD-10-CM | POA: Insufficient documentation

## 2022-03-24 ENCOUNTER — Ambulatory Visit: Admit: 2022-03-24 | Discharge: 2022-03-28 | Payer: MEDICARE

## 2022-03-28 MED ORDER — RIFAXIMIN 20 MG/ML ORAL SUSPENSION WAM
Freq: Three times a day (TID) | GASTROSTOMY | 0 refills | 30 days | Status: CP
Start: 2022-03-28 — End: 2022-04-27
  Filled 2022-03-28: qty 440, 15d supply, fill #0

## 2022-03-28 MED ORDER — PYRIDOSTIGMINE BROMIDE 60 MG/5 ML ORAL SYRUP
Freq: Three times a day (TID) | GASTROSTOMY | 0 refills | 30 days | Status: CP
Start: 2022-03-28 — End: 2022-04-27
  Filled 2022-03-28: qty 225, 30d supply, fill #0

## 2022-03-29 ENCOUNTER — Telehealth: Admit: 2022-03-29 | Discharge: 2022-03-30 | Payer: MEDICARE | Attending: Registered" | Primary: Registered"

## 2022-03-31 ENCOUNTER — Encounter (INDEPENDENT_AMBULATORY_CARE_PROVIDER_SITE_OTHER): Payer: Self-pay | Admitting: Pediatrics

## 2022-03-31 ENCOUNTER — Ambulatory Visit: Admit: 2022-03-31 | Discharge: 2022-04-10 | Disposition: A | Discharge status: Home / Self Care | Payer: MEDICARE

## 2022-03-31 ENCOUNTER — Ambulatory Visit: Admit: 2022-03-31 | Payer: MEDICARE

## 2022-04-01 ENCOUNTER — Ambulatory Visit (INDEPENDENT_AMBULATORY_CARE_PROVIDER_SITE_OTHER): Payer: Medicare Other | Admitting: Pediatrics

## 2022-04-10 MED ORDER — PYRIDOSTIGMINE BROMIDE 60 MG/5 ML ORAL SYRUP
Freq: Three times a day (TID) | ORAL | 1 refills | 31 days | Status: CP
Start: 2022-04-10 — End: ?
  Filled 2022-05-12: qty 120, 30d supply, fill #0

## 2022-04-10 MED ORDER — PYRIDOXINE (VITAMIN B6) 50 MG TABLET
ORAL_TABLET | Freq: Every day | ORAL | 0 refills | 100 days | Status: CP
Start: 2022-04-10 — End: ?
  Filled 2022-04-10: qty 100, 100d supply, fill #0

## 2022-04-12 ENCOUNTER — Telehealth: Admit: 2022-04-12 | Discharge: 2022-04-13 | Payer: MEDICARE | Attending: Registered" | Primary: Registered"

## 2022-04-23 ENCOUNTER — Ambulatory Visit: Admit: 2022-04-23 | Discharge: 2022-04-24 | Payer: MEDICARE

## 2022-04-23 DIAGNOSIS — K5989 Chronic intestinal pseudo-obstruction: Principal | ICD-10-CM

## 2022-04-26 DIAGNOSIS — K5989 Chronic intestinal pseudo-obstruction: Principal | ICD-10-CM

## 2022-04-28 NOTE — Progress Notes (Signed)
Patient: Jonathan Kirby MRN: [id number] Sex: male DOB: 09-10-1986  Provider: Lorenz Coaster, MD Location of Care: Pediatric Specialist- Pediatric Complex Care Note type: Routine return visit  History of Present Illness: Referral Source: [name], MD History from: patient and prior records Chief Complaint: complex care intake   Jonathan Kirby is a 36 y.o. male with history of autism and focal epilepsy who I am seeing in follow-up for complex care management. Patient was last seen 01/14/22 for ED follow up  where I continued Felbatol and discussed emergency medications with the family.  Since that appointment, patient has seen Elveria Rising, [facility] on 02/01/22 where they discussed when to administer clonazepam and valtoco. He was admitted on 02/21/22 for a small bowel obstruction, was seen in the ED on 03/24/22 for GI dysmotility, and was admitted again on 03/31/22 for abdominal pain.   Patient presents today with his mother and caregiver. They report the following:  Symptom management:  Has only had 2 seizures at home since the beginning of July. Continues to give 8 mL BID. Liver levels are okay. Mom is worried that the felbatol makes him more sedated and that he is not as interactive. She reports that his klonopin helped him after a seizure, and he did not have more after this. It also helps with aggressive behavior.   She does note that his pain and lack of sleep could be causing his lack of interaction. She notes he doesn't sleep well because mom has to wake him up in the middle of the night often.   Mom reports that they have been having some trouble with his feeds. Mom reports that they have only been able to give him two feeds a day where they recommended 4 feeds.   Concerned the mestinon, causing him to become uncomfortable with his feeds. Right now is giving protonix, then give felbatol and mestinon, 30 min after that they will try to feed him PO. If he shows discomfort then they back of  off his feed.   Care coordination (other providers): He followed up with Dr. Carmon Sails for GI on 01/29/22 who switched him back to protonix from pepcid. He has also continued to follow up with Joelyn Oms, RD to manage his feeds, most recently on 04/12/22.   Care management needs:  Home health has discharged them after training for tube feeds. Mom would like more training on the low profile tube, she reports 20 french tubes.   Equipment needs:  Mom reports no new equipment needs.   Decision making/Advanced care planning: Moms goal is for him to be interactive and happy. She is not sure if his current unhappiness is due to pain or due to felbatol.   Past Medical History Past Medical History:  Diagnosis Date   Autism    GERD (gastroesophageal reflux disease)    Seizures (HCC)     Surgical History Past Surgical History:  Procedure Laterality Date   CIRCUMCISION  1987   ESOPHAGOGASTRODUODENOSCOPY N/A 04/17/2020   Procedure: ESOPHAGOGASTRODUODENOSCOPY (EGD);  Surgeon: Pasty Spillers, MD;  Location: [facility] ENDOSCOPY;  Service: Endoscopy;  Laterality: N/A;   ESOPHAGOGASTRODUODENOSCOPY (EGD) WITH PROPOFOL N/A 08/28/2020   Procedure: ESOPHAGOGASTRODUODENOSCOPY (EGD) WITH PROPOFOL;  Surgeon: Toney Reil, MD;  Location: [facility] ENDOSCOPY;  Service: Gastroenterology;  Laterality: N/A;    Family History family history includes Alzheimer's disease in his maternal grandfather; Cancer in his maternal grandmother; Stroke in his paternal grandfather.   Social History Social History   Social History  Narrative   Jonathan Kirby does not attend any school or day program at this time.    He lives with his parents.    He enjoys listening to music, playing with remote control cars, and collecting newspapers.    Allergies Allergies  Allergen Reactions   Lactose Intolerance (Gi)     Upset stomach, excessive gas   Vimpat [Lacosamide]     Elevated liver enzymes after starting TPN & Vimpat     Medications Current Outpatient Medications on File Prior to Visit  Medication Sig Dispense Refill   pyridostigmine (MESTINON) 60 MG/5ML solution Take 1.3 mLs by mouth 3 (three) times daily.     clonazePAM (KLONOPIN) 0.25 MG disintegrating tablet Place 1 tablet dissolve in water and put through the tube, every 8 hours for 24 hours when seizures occur. 30 tablet 3   diazePAM, 20 MG Dose, (VALTOCO 20 MG DOSE) 2 x 10 MG/0.1ML LQPK Place 20 mg into the nose as needed (seizure longer than 2-3 minutes). (Patient not taking: Reported on 03/11/2022) 2 each 2   felbamate (FELBATOL) 600 MG/5ML suspension Take 8 mLs (965 mg total) by mouth 2 (two) times daily. 480 mL 5   hydrOXYzine (VISTARIL) 25 MG capsule Give 1 capsule at bedtime (Patient not taking: Reported on 12/24/2021) 30 capsule 0   melatonin 1 MG TABS tablet Take 3 tablets (3 mg total) by mouth at bedtime as needed. 90 tablet 5   Multiple Vitamins-Minerals (CENTRUM MULTIGUMMIES) CHEW Chew 2 Pieces by mouth daily.     pantoprazole sodium (PROTONIX) 40 mg Take 40 mg by mouth daily.     simethicone (MYLICON) 40 MG/0.6ML drops Take 40 mg by mouth every 6 (six) hours as needed. (Patient not taking: Reported on 03/11/2022)     No current facility-administered medications on file prior to visit.   The medication list was reviewed and reconciled. All changes or newly prescribed medications were explained.  A complete medication list was provided to the patient/caregiver.  Physical Exam BP 120/80   Pulse 84   Wt 103 lb 14.4 oz (47.1 kg)   BMI 17.29 kg/m  Weight for age: Facility age limit for growth %iles is 20 years.  Length for age: Facility age limit for growth %iles is 20 years. BMI: Body mass index is 17.29 kg/m. No results found. Gen: well appearing neuroaffected man.  Initially engaged, but progressively more agitated.  Skin: No rash, No neurocutaneous stigmata. HEENT: Microcephalic, no dysmorphic features, no conjunctival injection,  nares patent, mucous membranes moist, oropharynx clear.  Neck: Supple, no meningismus. No focal tenderness. Resp: Clear to auscultation bilaterally CV: Regular rate, normal S1/S2, no murmurs, no rubs Abd: BS present, abdomen soft, non-tender, non-distended. No hepatosplenomegaly or mass Ext: Warm and well-perfused. No deformities, no muscle wasting, ROM full.  Neurological Examination: MS: Awake, alert.  Nonverbal, but interactive, responds to certain words in conversation with grunts.   Cranial Nerves: Pupils were equal and reactive to light;  No clear visual field defect, no nystagmus; no ptsosis, face symmetric with full strength of facial muscles, hearing grossly intact, palate elevation is symmetric. Motor-Fairly normal tone throughout, moves extremities at least antigravity. No abnormal movements Reflexes- Reflexes 2+ and symmetric in the biceps, triceps, patellar and achilles tendon. Plantar responses flexor bilaterally, no clonus noted Sensation: Responds to touch in all extremities.  Coordination: Does not reach for objects.  Gait: in wheelchair during visit   Diagnosis:  1. Visceral hyperalgesia   2. Partial epilepsy with impairment of consciousness (HCC)  3. Insomnia, unspecified type   4. Sleep arousal disorder      Assessment and Plan CONOR LATA is a 36 y.o. male with history of  autism and focal epilepsy who presents for follow-up in the pediatric complex care clinic.  Patient seen by case manager, dietician, integrated behavioral health today as well, please see accompanying notes.  I discussed case with all involved parties for coordination of care and recommend patient follow their instructions as below.   Symptom management:   - Started gabapentin 2 mL TID - Continue felbatol 8 mL BID - Advised they are okay to use benadryl for sleep  Care coordination: - Recommend he continue to follow up with Dr. Carmon Sails for GI on 05/21/22  Care management needs:  - Scheduled  nurse training movie for Sep 5 at 1:30   Equipment needs:  - Mom reports they may be transitioning to low profile g-tube  Decision making/Advanced care planning: - Not addressed at this visit, patient remains at full code.    The CARE PLAN for reviewed and revised to represent the changes above.  This is available in Epic under snapshot, and a physical binder provided to the patient, that can be used for anyone providing care for the patient.   I spent 35 minutes on day of service on this patient including review of chart, discussion with patient and family, discussion of screening results, coordination with other providers and management of orders and paperwork.     Return in about 3 months (around 08/05/2022).  I, Mayra Reel, scribed for and in the presence of Lorenz Coaster, MD at today's visit on 05/05/2022.    I, Lorenz Coaster MD MPH, personally performed the services described in this documentation, as scribed by Mayra Reel in my presence on 05/05/2022 and it is accurate, complete, and reviewed by me.    Lorenz Coaster MD MPH Neurology,  Neurodevelopment and Neuropalliative care [facility] Child Neurology  [address] [place], [place], [place] [address] Phone: [phone] Fax: [phone]

## 2022-05-03 ENCOUNTER — Telehealth: Admit: 2022-05-03 | Discharge: 2022-05-04 | Payer: MEDICARE | Attending: Registered" | Primary: Registered"

## 2022-05-05 ENCOUNTER — Ambulatory Visit (INDEPENDENT_AMBULATORY_CARE_PROVIDER_SITE_OTHER): Payer: Medicare Other | Admitting: Pediatrics

## 2022-05-05 ENCOUNTER — Encounter (INDEPENDENT_AMBULATORY_CARE_PROVIDER_SITE_OTHER): Payer: Self-pay | Admitting: Pediatrics

## 2022-05-05 VITALS — BP 120/80 | HR 84 | Wt 103.9 lb

## 2022-05-05 DIAGNOSIS — R198 Other specified symptoms and signs involving the digestive system and abdomen: Secondary | ICD-10-CM

## 2022-05-05 DIAGNOSIS — G40209 Localization-related (focal) (partial) symptomatic epilepsy and epileptic syndromes with complex partial seizures, not intractable, without status epilepticus: Secondary | ICD-10-CM | POA: Diagnosis not present

## 2022-05-05 DIAGNOSIS — G478 Other sleep disorders: Secondary | ICD-10-CM

## 2022-05-05 DIAGNOSIS — F840 Autistic disorder: Secondary | ICD-10-CM | POA: Diagnosis not present

## 2022-05-05 DIAGNOSIS — G4700 Insomnia, unspecified: Secondary | ICD-10-CM | POA: Diagnosis not present

## 2022-05-05 MED ORDER — GABAPENTIN 250 MG/5ML PO SOLN: 100.0000 mg | Freq: Three times a day (TID) | ORAL | 12 refills | Status: DC

## 2022-05-05 NOTE — Patient Instructions (Addendum)
Start gabapentin, I recommend giving him 2 mL three times a day, every 8 hours.  If he does well on this for a week and he is not too sleepy, you can increase this to 3 mL and a week later to 4 mL if needed.  Continue Felbatol at 8 mL twice a day.  It is okay to use benadryl for sleep for now.  Come in to see Maralyn Sago on Sep 5, at 1:30 for training on the g-tube.

## 2022-05-17 ENCOUNTER — Encounter (INDEPENDENT_AMBULATORY_CARE_PROVIDER_SITE_OTHER): Payer: Self-pay | Admitting: Pediatrics

## 2022-05-17 ENCOUNTER — Telehealth (INDEPENDENT_AMBULATORY_CARE_PROVIDER_SITE_OTHER): Payer: Self-pay | Admitting: Pediatrics

## 2022-05-17 ENCOUNTER — Encounter: Admit: 2022-05-17 | Payer: MEDICARE

## 2022-05-17 ENCOUNTER — Ambulatory Visit: Admit: 2022-05-17 | Payer: MEDICARE

## 2022-05-17 ENCOUNTER — Ambulatory Visit: Admit: 2022-05-17 | Discharge: 2022-05-28 | Disposition: A | Discharge status: Home Health | Payer: MEDICARE

## 2022-05-17 NOTE — Telephone Encounter (Signed)
Return call to Mom-  Started Gabapentin 2 ml tid had been on it about 4 days did not see benefits of it - Started Mestinon 1 month ago but is causing some cramping and passing a lot of gas after Mestinon Noted on Friday decreased output from his ostomy, output  goes from watery to thick, takes 6- 8 oz cups of water by tube per day,  having a lot of pain Tylenol does not help until after the 3rd dose.  Unable to get a full Osmolite feeding (usually 3 a day) in without pain. Mom starts Osmolite 3 hrs after Mestinon and can only get 160 ml in 2 x a day. Will eat morning cereal and some at lunch. Yesterday was the first time she saw any signs of pain after oral intake.  Mom started Gabapentin though GI did not want any med changes- She is concerned could it be causing constipation. His usual output is 400 ml but is decreased. RN explained in order to make stool you have to take in calories. If he is mostly getting water that makes urine but not stool. She reports he was eating his usual amount of food by mouth but his output decreased. GI is aware of the gas cramping Mestinon is causing and may increase it at next appt though mom is not sure since he is in so much pain and having a lot of drooling. She reports he gets on his knees in his bed leaning over with pain. She denies any abd distention or hard areas in his abd. She reports abd is soft in the mornings.

## 2022-05-17 NOTE — Telephone Encounter (Signed)
  Name of who is calling:Darlene   Caller's Relationship to Patient:mother   Best contact number:[phone]  Provider they see:Dr. Artis Flock   Reason for call:mom called needing a call back with medication advice. Mom stated that he is still in pain and now struggling with constipation. Please advise      PRESCRIPTION REFILL ONLY  Name of prescription:  Pharmacy:

## 2022-05-20 NOTE — Telephone Encounter (Signed)
Message addressed via mychart message sent on same day.

## 2022-05-21 ENCOUNTER — Telehealth (INDEPENDENT_AMBULATORY_CARE_PROVIDER_SITE_OTHER): Payer: Self-pay | Admitting: Pediatrics

## 2022-05-21 NOTE — Telephone Encounter (Signed)
LVM for mom that appt would have to be rescheduled bc RN is not in the office on that day. She can ask the hospital staff to teach her about the feeding tube or she can go online to [employer] for patient and families, gastroenterology and they have an excellent video on using the feeding tube. The manufacturer of the buttons also have videos. Adv will send message to Dr. Artis Flock about him being admitted.

## 2022-05-21 NOTE — Telephone Encounter (Signed)
Called mom to confirm Jonathan Kirby's Nurse Visit appt for Tuesday 9/5. Mom stated that he's in the hospital and she may have to reschedule appt. Mom is requesting a callback.

## 2022-05-25 ENCOUNTER — Ambulatory Visit (INDEPENDENT_AMBULATORY_CARE_PROVIDER_SITE_OTHER): Payer: Medicare Other

## 2022-05-28 MED ORDER — THIAMINE HCL (VITAMIN B1) 100 MG TABLET
ORAL_TABLET | 1 refills | 100 days | Status: CP
Start: 2022-05-28 — End: ?
  Filled 2022-05-28: qty 100, 100d supply, fill #0

## 2022-05-28 MED ORDER — MULTIVIT AND MINERALS-FERROUS GLUCONATE 9 MG IRON/15 ML ORAL LIQUID
Freq: Every day | GASTROENTERAL | 1 refills | 15 days | Status: CP
Start: 2022-05-28 — End: ?

## 2022-05-28 MED ORDER — PYRIDOSTIGMINE BROMIDE 60 MG/5 ML ORAL SYRUP
Freq: Three times a day (TID) | ORAL | 1 refills | 19 days | Status: CP
Start: 2022-05-28 — End: ?
  Filled 2022-05-28: qty 120, 19d supply, fill #0

## 2022-06-11 ENCOUNTER — Ambulatory Visit: Admit: 2022-06-11 | Discharge: 2022-06-12 | Payer: MEDICARE

## 2022-06-11 DIAGNOSIS — K5909 Other constipation: Principal | ICD-10-CM

## 2022-06-11 MED ORDER — PRUCALOPRIDE 1 MG TABLET
ORAL_TABLET | Freq: Every day | ORAL | 11 refills | 0 days | Status: CP
Start: 2022-06-11 — End: 2023-06-11

## 2022-06-18 DIAGNOSIS — K219 Gastro-esophageal reflux disease without esophagitis: Principal | ICD-10-CM

## 2022-06-18 MED ORDER — PANTOPRAZOLE 2MG/ML SUS
Freq: Two times a day (BID) | ORAL | 3 refills | 90 days | Status: CP
Start: 2022-06-18 — End: 2023-06-18
  Filled 2022-06-28: qty 520, 13d supply, fill #0

## 2022-06-23 DIAGNOSIS — K219 Gastro-esophageal reflux disease without esophagitis: Principal | ICD-10-CM

## 2022-06-25 ENCOUNTER — Ambulatory Visit: Admit: 2022-06-25 | Discharge: 2022-06-27 | Payer: MEDICARE

## 2022-06-27 MED ORDER — LIDOCAINE 4 % TOPICAL PATCH
MEDICATED_PATCH | Freq: Every day | TRANSDERMAL | 0 refills | 30 days | Status: CP
Start: 2022-06-27 — End: ?

## 2022-06-27 MED ORDER — ALUMINUM-MAG HYDROXIDE-SIMETHICONE 400 MG-400 MG-40 MG/5 ML ORAL SUSP
Freq: Every day | GASTROSTOMY | 0 refills | 3 days | Status: CP | PRN
Start: 2022-06-27 — End: ?

## 2022-06-28 MED FILL — PYRIDOSTIGMINE BROMIDE 60 MG/5 ML ORAL SYRUP: ORAL | 19 days supply | Qty: 120 | Fill #0

## 2022-06-30 ENCOUNTER — Ambulatory Visit: Admit: 2022-06-30 | Discharge: 2022-07-01 | Payer: MEDICARE | Attending: Registered" | Primary: Registered"

## 2022-07-02 ENCOUNTER — Ambulatory Visit: Admit: 2022-07-02 | Discharge: 2022-07-03 | Payer: MEDICARE

## 2022-07-06 DIAGNOSIS — K5989 Chronic intestinal pseudo-obstruction: Principal | ICD-10-CM

## 2022-07-06 DIAGNOSIS — R1310 Dysphagia, unspecified: Principal | ICD-10-CM

## 2022-07-08 MED FILL — WATER FOR IRRIGATION, STERILE SOLUTION, METHYLCELLULOSE 4000CPS (BULK) 30 % POWDER, SODIUM BICARBONATE (BULK) POWDER, PANTOPRAZOLE 40 MG TABLET,DELAYED RELEASE: ORAL | 13 days supply | Qty: 520 | Fill #1

## 2022-07-14 ENCOUNTER — Encounter: Admit: 2022-07-14 | Discharge: 2022-07-26 | Payer: MEDICARE | Attending: Anesthesiology

## 2022-07-14 ENCOUNTER — Ambulatory Visit: Admit: 2022-07-14 | Discharge: 2022-07-26 | Disposition: A | Discharge status: Home / Self Care | Payer: MEDICARE

## 2022-07-14 ENCOUNTER — Encounter: Admit: 2022-07-14 | Payer: MEDICARE | Attending: Anesthesiology

## 2022-07-14 ENCOUNTER — Ambulatory Visit: Admit: 2022-07-14 | Payer: MEDICARE

## 2022-07-25 MED ORDER — PANTOPRAZOLE 2MG/ML SUS
Freq: Two times a day (BID) | ORAL | 0 refills | 30.00000 days | Status: CP
Start: 2022-07-25 — End: 2022-08-24
  Filled 2022-07-26: qty 250, 6d supply, fill #0

## 2022-08-02 MED FILL — WATER FOR IRRIGATION, STERILE SOLUTION, METHYLCELLULOSE 4000CPS (BULK) 30 % POWDER, SODIUM BICARBONATE (BULK) POWDER, PANTOPRAZOLE 40 MG TABLET,DELAYED RELEASE: ORAL | 13 days supply | Qty: 520 | Fill #0

## 2022-08-04 NOTE — Progress Notes (Incomplete)
Patient: Jonathan Kirby MRN: [id number] Sex: male DOB: Nov 09, 1985  Provider: Lorenz Coaster, MD Location of Care: Pediatric Specialist- Pediatric Complex Care Note type: Routine return visit  History of Present Illness: Referral Source: [name], MD History from: patient and prior records Chief Complaint: complex care intake   Jonathan Kirby is a 36 y.o. 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 Patient was last seen 05/05/22 where I started gabapentin and continued felbatol.  Since that appointment, mom reported gastric slowing since starting gabapentin on 05/17/22 and I recommended stopping medication until follow up with GI. 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 To address they switched his formula to Nutren 2.0 and increased mestinon. He was seen in the ED again on 06/25/22 for SBO and then was admitted on 07/14/22 for continued pain where he had a laryngoscopy with complex revision of end ileostomy with resection.   Patient presents today with {CHL AMB PARENT/GUARDIAN:210130214} They report their largest concern is ***  Symptom management:     Care coordination (other providers): He saw Dr. Carmon Sails for GI on 06/11/22 where no changes were made. He saw her again on 08/06/22 where.   Care management needs:   Equipment needs:   Decision making/Advanced care planning:  Diagnostics/Patient history:   Review of Systems: {cn system review:210120003}  Past Medical History Past Medical History:  Diagnosis Date   Autism    GERD (gastroesophageal reflux disease)    Seizures (HCC)     Surgical History Past Surgical History:  Procedure Laterality Date    CIRCUMCISION  1987   ESOPHAGOGASTRODUODENOSCOPY N/A 04/17/2020   Procedure: ESOPHAGOGASTRODUODENOSCOPY (EGD);  Surgeon: Pasty Spillers, MD;  Location: [facility] ENDOSCOPY;  Service: Endoscopy;  Laterality: N/A;   ESOPHAGOGASTRODUODENOSCOPY (EGD) WITH PROPOFOL N/A 08/28/2020   Procedure: ESOPHAGOGASTRODUODENOSCOPY (EGD) WITH PROPOFOL;  Surgeon: Toney Reil, MD;  Location: [facility] ENDOSCOPY;  Service: Gastroenterology;  Laterality: N/A;    Family History family history includes Alzheimer's disease in his maternal grandfather; Cancer in his maternal grandmother; Stroke in his paternal grandfather.   Social History Social History   Social History Narrative   Jonathan Kirby does not attend any school or day program at this time.    He lives with his parents.    He enjoys listening to music, playing with remote control cars, and collecting newspapers.    Allergies Allergies  Allergen Reactions   Lactose Intolerance (Gi)     Upset stomach, excessive gas   Vimpat [Lacosamide]     Elevated liver enzymes after starting TPN & Vimpat    Medications Current Outpatient Medications on File Prior to Visit  Medication Sig Dispense Refill   clonazePAM (KLONOPIN) 0.25 MG disintegrating tablet Place 1 tablet dissolve in water and put through the tube, every 8 hours for 24 hours when seizures occur. 30 tablet 3   diazePAM, 20 MG Dose, (VALTOCO 20 MG DOSE) 2 x 10 MG/0.1ML LQPK Place 20 mg into the nose as needed (seizure longer than 2-3 minutes). (Patient not taking: Reported on 03/11/2022) 2 each 2   felbamate (FELBATOL) 600 MG/5ML suspension Take 8 mLs (965 mg total) by mouth 2 (two) times daily. 480 mL 5  gabapentin (NEURONTIN) 250 MG/5ML solution Take 2 mLs (100 mg total) by mouth 3 (three) times daily. 180 mL 12   hydrOXYzine (VISTARIL) 25 MG capsule Give 1 capsule at bedtime (Patient not taking: Reported on 12/24/2021) 30 capsule 0   melatonin 1 MG TABS tablet Take 3 tablets (3 mg total) by mouth at  bedtime as needed. 90 tablet 5   Multiple Vitamins-Minerals (CENTRUM MULTIGUMMIES) CHEW Chew 2 Pieces by mouth daily.     pantoprazole sodium (PROTONIX) 40 mg Take 40 mg by mouth daily.     pyridostigmine (MESTINON) 60 MG/5ML solution Take 1.3 mLs by mouth 3 (three) times daily.     simethicone (MYLICON) 40 MG/0.6ML drops Take 40 mg by mouth every 6 (six) hours as needed. (Patient not taking: Reported on 03/11/2022)     No current facility-administered medications on file prior to visit.   The medication list was reviewed and reconciled. All changes or newly prescribed medications were explained.  A complete medication list was provided to the patient/caregiver.  Physical Exam There were no vitals taken for this visit. Weight for age: Facility age limit for growth %iles is 20 years.  Length for age: Facility age limit for growth %iles is 20 years. BMI: There is no height or weight on file to calculate BMI. No results found.   Diagnosis: No diagnosis found.   Assessment and Plan Jonathan Kirby is a 36 y.o. 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  Patient seen by case manager, dietician, integrated behavioral health today as well, please see accompanying notes.  I discussed case with all involved parties for coordination of care and recommend patient follow their instructions as below.   Symptom management:     Care coordination:  Care management needs:   Equipment needs:   Decision making/Advanced care planning:  The CARE PLAN for reviewed and revised to represent the changes above.  This is available in Epic under snapshot, and a physical binder provided to the patient, that can be used for anyone providing care for the patient.   I  spent *** minutes on day of service on this patient including review of chart, discussion with patient and family, discussion of screening results, coordination with other providers and management of orders and paperwork.     No follow-ups on file.  I, Mayra Reel, scribed for and in the presence of Lorenz Coaster, MD at today's visit on 08/09/2022.   Lorenz Coaster MD MPH Neurology,  Neurodevelopment and Neuropalliative care [facility] Child Neurology  [address] [place], [place], [place] [address] Phone: [phone] Fax: [phone]

## 2022-08-05 ENCOUNTER — Ambulatory Visit: Admit: 2022-08-05 | Discharge: 2022-08-06 | Payer: MEDICARE

## 2022-08-06 ENCOUNTER — Ambulatory Visit: Admit: 2022-08-06 | Discharge: 2022-08-07 | Payer: MEDICARE

## 2022-08-08 ENCOUNTER — Ambulatory Visit
Admit: 2022-08-08 | Discharge: 2022-08-11 | Disposition: A | Discharge status: Home / Self Care | Payer: MEDICARE | Admitting: Adult Health

## 2022-08-09 ENCOUNTER — Ambulatory Visit (INDEPENDENT_AMBULATORY_CARE_PROVIDER_SITE_OTHER): Payer: Medicare Other | Admitting: Pediatrics

## 2022-08-09 ENCOUNTER — Other Ambulatory Visit (INDEPENDENT_AMBULATORY_CARE_PROVIDER_SITE_OTHER): Payer: Self-pay | Admitting: Pediatrics

## 2022-08-09 ENCOUNTER — Encounter (INDEPENDENT_AMBULATORY_CARE_PROVIDER_SITE_OTHER): Payer: Self-pay | Admitting: Pediatrics

## 2022-08-09 DIAGNOSIS — G40309 Generalized idiopathic epilepsy and epileptic syndromes, not intractable, without status epilepticus: Secondary | ICD-10-CM

## 2022-08-09 DIAGNOSIS — G40209 Localization-related (focal) (partial) symptomatic epilepsy and epileptic syndromes with complex partial seizures, not intractable, without status epilepticus: Secondary | ICD-10-CM

## 2022-08-11 ENCOUNTER — Telehealth (INDEPENDENT_AMBULATORY_CARE_PROVIDER_SITE_OTHER): Payer: Self-pay | Admitting: Family

## 2022-08-11 NOTE — Telephone Encounter (Signed)
Who's calling (name and relationship to patient) : Adelina Mings; [facility]  Best contact number: [phone]  Provider they see: Blane Ohara, NP/ Dr.Wolfe  Reason for call: Adelina Mings was calling in wantingto speak with a nurse  to know who is Jonathan Kirby's primary provider. She has requested a call back.   Call ID:      PRESCRIPTION REFILL ONLY  Name of prescription:  Pharmacy:

## 2022-08-11 NOTE — Telephone Encounter (Signed)
Contacted [name] with [facility].  Verified pt's name and DOB.  Jonathan Kirby stated that the patient is currently Admitted and the hospital are ordering an in home health nurse and possibly PT and other services.   She wanted to know if NP [place] or Dr. Artis Flock would follow on these orders.   I informed her they we would. She verbalized understanding of this.  SS, CCMA

## 2022-08-14 ENCOUNTER — Encounter: Admit: 2022-08-14 | Discharge: 2022-08-14 | Payer: MEDICARE

## 2022-08-16 DIAGNOSIS — K219 Gastro-esophageal reflux disease without esophagitis: Principal | ICD-10-CM

## 2022-08-17 DIAGNOSIS — K56609 Unspecified intestinal obstruction, unspecified as to partial versus complete obstruction: Principal | ICD-10-CM

## 2022-08-17 DIAGNOSIS — K5989 Other specified functional intestinal disorders: Principal | ICD-10-CM

## 2022-08-17 MED ORDER — PANTOPRAZOLE 2MG/ML SUS
Freq: Two times a day (BID) | ORAL | 0 refills | 30 days | Status: CP
Start: 2022-08-17 — End: 2022-09-16
  Filled 2022-08-17: qty 430, 10d supply, fill #0

## 2022-08-20 DIAGNOSIS — K219 Gastro-esophageal reflux disease without esophagitis: Principal | ICD-10-CM

## 2022-08-20 HISTORY — PX: STOMA REVISION: SHX2448

## 2022-08-21 MED ORDER — PANTOPRAZOLE 2MG/ML SUS
Freq: Two times a day (BID) | ORAL | 3 refills | 90 days | Status: CP
Start: 2022-08-21 — End: 2023-08-21
  Filled 2022-08-30: qty 520, 13d supply, fill #0

## 2022-08-21 MED ORDER — PYRIDOSTIGMINE BROMIDE 60 MG/5 ML ORAL SYRUP
Freq: Three times a day (TID) | ORAL | 1 refills | 19 days | Status: CP
Start: 2022-08-21 — End: ?
  Filled 2022-08-23: qty 120, 19d supply, fill #0

## 2022-08-30 ENCOUNTER — Institutional Professional Consult (permissible substitution): Admit: 2022-08-30 | Discharge: 2022-08-31 | Payer: MEDICARE

## 2022-08-30 ENCOUNTER — Ambulatory Visit: Admit: 2022-08-30 | Discharge: 2022-08-31 | Payer: MEDICARE

## 2022-09-01 ENCOUNTER — Other Ambulatory Visit (INDEPENDENT_AMBULATORY_CARE_PROVIDER_SITE_OTHER): Payer: Self-pay | Admitting: Pediatrics

## 2022-09-01 DIAGNOSIS — G40209 Localization-related (focal) (partial) symptomatic epilepsy and epileptic syndromes with complex partial seizures, not intractable, without status epilepticus: Secondary | ICD-10-CM

## 2022-09-07 ENCOUNTER — Other Ambulatory Visit (INDEPENDENT_AMBULATORY_CARE_PROVIDER_SITE_OTHER): Payer: Self-pay | Admitting: Pediatrics

## 2022-09-07 DIAGNOSIS — G40309 Generalized idiopathic epilepsy and epileptic syndromes, not intractable, without status epilepticus: Secondary | ICD-10-CM

## 2022-09-07 DIAGNOSIS — G40209 Localization-related (focal) (partial) symptomatic epilepsy and epileptic syndromes with complex partial seizures, not intractable, without status epilepticus: Secondary | ICD-10-CM

## 2022-09-07 NOTE — Telephone Encounter (Signed)
Seen 03/11/22- then hospitalized for GI issues- next OV 09/22/2022  last filled 08/09/22

## 2022-09-09 MED FILL — WATER FOR IRRIGATION, STERILE SOLUTION, METHYLCELLULOSE 4000CPS (BULK) 30 % POWDER, SODIUM BICARBONATE (BULK) POWDER, PANTOPRAZOLE 40 MG TABLET,DELAYED RELEASE: ORAL | 10 days supply | Qty: 400 | Fill #1

## 2022-09-14 MED FILL — PYRIDOSTIGMINE BROMIDE 60 MG/5 ML ORAL SYRUP: ORAL | 19 days supply | Qty: 120 | Fill #0

## 2022-09-17 NOTE — Progress Notes (Signed)
Patient: Jonathan Kirby MRN: [id number] Sex: male DOB: 01-15-1986  Provider: Lorenz Coaster, MD Location of Care: Pediatric Specialist- Pediatric Complex Care Note type: Routine return visit  History of Present Illness: Referral Source: [name], MD History from: patient and prior records Chief Complaint: complex care intake   Jonathan Kirby is a 36 y.o. male with history of autism and focal epilepsy who I am seeing in follow-up for complex care management. Patient was last seen 05/05/22 where I started gabapentin and continued felbatol.  Since that appointment, mom reached out on 05/17/22 to report worsening constipation and gastric pain with gabapentin for which I recommended stopping the medication. He was then admitted on 05/17/22, seen in the ED on 06/25/22, and was admitted again on 07/14/22 all related to low gastric motility and abdominal pain. He had an ostomy revision on 07/20/22. He was then admitted again on 08/08/22 for continued pain where they started him on home TPN.   Patient presents today with his parents. They report their largest concern is his visceral pain.  Symptom management:  They report that he has been doing pretty well on TPN. Tolerates it very well. With this regimen, eating more by mouth. They decreased the amount of feeds by day and was eating well in the hospital.   Something is causing him to pain at night for a few hours. Mom is not sure if it is TPN at night or if it is his night dose of mestinon. She does report that it does not last all night, only a few hours. Mom feels that the mestinon hurst less if he takes it with a full stomach. Eating breakfast before morning meds, snack around 12, lunch around 2, and dinner around 7.   Has only had 1 seizure, lasting less than 1 min since November, but it ended with Klonopin He had 2 other moments where mom was not sure if they were seizure. Did have seizures in the hospital but this was due to not being able to be  on the felbatol. Did put him on Keppra (low doses for mood), but he still did have breakthrough events.   If he is admitted in the future, mom feels the best plan was low dose Keppra through IV, then added in Klonopin PO as he could tolerate things by gut. Slowly then transition back to felbatol.   For mood, are using 0.25 mg tablets PRN for irritability, but only need to used this <1x per month.  Care coordination (other providers): He saw Dr. Carmon Sails on 06/11/22 and on 07/02/22 where she noted the need to work on improving his motility (adding Motegrity 1mg ) and he need to have is ostomy revised.    He did see Dr. on 08/05/22 where he discussed possible botox injections into the abdominal wall. However, two days later he was direct admitted for further weight loss.   He saw Dr. 08/07/22 on 08/30/22 who noted patient was doing well since his last office visit per his parents. He will continue to work with GI regarding motility issues and continue with home TPN. With this regimen recommended continuing with Dr. 14/11/23.   Care management needs:  Nurse continues to come out 1x weekly.   Patient History: He had a mixture of complex partial seizures with secondary generalization, and myoclonic seizures.  These were quite frequent, and caused significant impairment in his function.  Once we were able to bring seizures under control Felbatol, he showed a great deal  of anxiety and depression.  This has improved as he has become older.  For the most part his seizures have been well controlled on Felbatol.  Most recurrent seizures have occurred in the setting of forgotten doses of medication.  He also has problems with constipation.  Diagnostics:  EEG 08/28/20 Impression:  This study is consistent with patient's known history of epilepsy, which could be fragments of generalized epilepsy or multifocal epilepsy.  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  Past Medical History Past Medical History:  Diagnosis Date   Autism    GERD (gastroesophageal reflux disease)    Seizures (HCC)     Surgical History Past Surgical History:  Procedure Laterality Date   CIRCUMCISION  1987   ESOPHAGOGASTRODUODENOSCOPY N/A 04/17/2020   Procedure: ESOPHAGOGASTRODUODENOSCOPY (EGD);  Surgeon: Pasty Spillers, MD;  Location: [facility] ENDOSCOPY;  Service: Endoscopy;  Laterality: N/A;   ESOPHAGOGASTRODUODENOSCOPY (EGD) WITH PROPOFOL N/A 08/28/2020   Procedure: ESOPHAGOGASTRODUODENOSCOPY (EGD) WITH PROPOFOL;  Surgeon: Toney Reil, MD;  Location: [facility] ENDOSCOPY;  Service: Gastroenterology;  Laterality: N/A;   STOMA REVISION  08/2022    Family History family history includes Alzheimer's disease in his maternal grandfather; Cancer in his maternal grandmother; Stroke in his paternal grandfather.   Social History Social History   Social History Narrative   Jonathan Kirby does not attend any school or day program at this time.    He lives with his parents.    He enjoys listening to music, playing with remote control cars, and collecting newspapers.    Allergies Allergies  Allergen Reactions   Lactose Intolerance (Gi)     Upset stomach, excessive gas   Vimpat [Lacosamide]     Elevated liver enzymes after starting TPN & Vimpat    Medications Current Outpatient Medications on File Prior to Visit  Medication Sig Dispense Refill   melatonin 1 MG TABS tablet Take 3 tablets (3 mg total) by mouth at bedtime as needed. 90 tablet 5   pantoprazole sodium (PROTONIX) 40 mg Take 40 mg by mouth daily.     pyridostigmine (MESTINON) 60 MG/5ML solution Take 1.3 mLs by mouth 3 (three) times daily.     simethicone (MYLICON) 40 MG/0.6ML drops Take 40 mg by mouth every 6 (six) hours as needed.     diazePAM, 20 MG Dose, (VALTOCO 20 MG DOSE) 2 x 10 MG/0.1ML LQPK Place 20 mg into the nose as needed (seizure longer than 2-3 minutes).  (Patient not taking: Reported on 03/11/2022) 2 each 2   hydrOXYzine (VISTARIL) 25 MG capsule Give 1 capsule at bedtime (Patient not taking: Reported on 12/24/2021) 30 capsule 0   Multiple Vitamins-Minerals (CENTRUM MULTIGUMMIES) CHEW Chew 2 Pieces by mouth daily.     No current facility-administered medications on file prior to visit.   The medication list was reviewed and reconciled. All changes or newly prescribed medications were explained.  A complete medication list was provided to the patient/caregiver.  Physical Exam Wt 113 lb 8 oz (51.5 kg)   BMI 18.89 kg/m  Weight for age: Facility age limit for growth %iles is 20 years.  Length for age: Facility age limit for growth %iles is 20 years. BMI: Body mass index is 18.89 kg/m. No results found. Gen: well appearing neuroaffected man Skin: No rash, No neurocutaneous stigmata. HEENT: Normocephalic, no dysmorphic features, no conjunctival injection, nares patent, mucous membranes moist, oropharynx clear.  Neck: Supple, no meningismus. No focal tenderness. Resp: Clear to  auscultation bilaterally CV: Regular rate, normal S1/S2, no murmurs, no rubs Abd: BS present, abdomen soft, non-tender, non-distended. No hepatosplenomegaly or mass. Gtube in place.  Ext: Warm and well-perfused. No deformities, no muscle wasting, ROM full.  Neurological Examination: MS: Awake, alert.  Nonverbal, but interactive. Eaily irritated.  Cranial Nerves: Pupils were equal and reactive to light;  No clear visual field defect, no nystagmus; no ptsosis, face symmetric with full strength of facial muscles, hearing grossly intact, palate elevation is symmetric. Motor-Fairly normal tone throughout, moves extremities at least antigravity. No abnormal movements Reflexes- Reflexes 2+ and symmetric in the biceps, triceps, patellar and achilles tendon. Plantar responses flexor bilaterally, no clonus noted Sensation: Responds to touch in all extremities.  Coordination: Does  not reach for objects.  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 Partial epilepsy with impairment of consciousness (HCC)   3. Generalized convulsive epilepsy (HCC)   4. Visceral hyperalgesia      Assessment and Plan Jonathan Kirby is a 36 y.o. male with history autism and focal epilepsy of who presents for follow-up in the pediatric complex care clinic.  Patient seen by case manager, dietician, integrated behavioral health today as well, please see accompanying notes.  I discussed case with all involved parties for coordination of care and recommend patient follow their instructions as below.   Symptom management:  Minimal seizures while on current medication, continue felbatol at current dosage. In addition, discussed plan for when he is unable to take medications in his gut. Agree with plan to give low dose Keppra through IV, then added in Klonopin PO as he could tolerate things by gut. Slowly then transition back to felbatol.   Patient continuing to have visceral pain, particularly at night. Discussed retrying gabapentin as previous trial was  disrupted by a postobstruction that required admission to treat. I do not think it is likely that the gabapentin will cause worsened constipation. I also advised that they can continue to use Klonopin to treat both seizure and pain. In addition, to treat his pain, I advised the family to ask GI about options as they may know more about treating the specific pain he is having.   Parents do report that he is having increased pain with dosing the mestinon on an empty stomach. I recommended working to give the doses with meals and follow up with GI about this as well.  - Continue felbatol - Start gabapentin - Continue klonopin for seizures and to treat pain  Care coordination: - Advised the family follow up with GI and ask about pain management as well as giving mestonin around meals.  Equipment needs:  - Due to  patient's medical condition, patient is indefinitely incontinent of stool and urine.  It is medically necessary for them to use diapers, underpads, and gloves to assist with hygiene and skin integrity.  They require a frequency of up to 200 a month.   Decision making/Advanced care planning: - Not addressed at this visit, patient remains at full code.    The CARE PLAN for reviewed and revised to represent the changes above.  This is available in Epic under snapshot, and a physical binder provided to the patient, that can be used for anyone providing care for the patient.   I spent 55 minutes on day of service on this patient including review of chart, discussion with patient and family, discussion of screening results, coordination with other providers and management  of orders and paperwork.     Return in about 3 months (around 12/22/2022).  I, Mayra ReelEllie Canty, scribed for and in the presence of Lorenz CoasterStephanie Chavela Justiniano, MD at today's visit on 09/22/2022.   I, Lorenz CoasterStephanie Katilin Raynes MD MPH, personally performed the services described in this documentation, as scribed by Mayra ReelEllie Canty in my presence on 09/22/2022 and it is accurate, complete, and reviewed by me.    Lorenz CoasterStephanie Mario Voong MD MPH Neurology,  Neurodevelopment and Neuropalliative care [facility] Child Neurology  [address] [place] [address], [place], [place] [address] Phone: [phone] Fax: [phone]

## 2022-09-21 MED FILL — WATER FOR IRRIGATION, STERILE SOLUTION, METHYLCELLULOSE 4000CPS (BULK) 30 % POWDER, SODIUM BICARBONATE (BULK) POWDER, PANTOPRAZOLE 40 MG TABLET,DELAYED RELEASE: ORAL | 13 days supply | Qty: 520 | Fill #2

## 2022-09-22 ENCOUNTER — Ambulatory Visit (INDEPENDENT_AMBULATORY_CARE_PROVIDER_SITE_OTHER): Payer: Medicare HMO | Admitting: Pediatrics

## 2022-09-22 ENCOUNTER — Encounter (INDEPENDENT_AMBULATORY_CARE_PROVIDER_SITE_OTHER): Payer: Self-pay | Admitting: Pediatrics

## 2022-09-22 VITALS — Wt 113.5 lb

## 2022-09-22 DIAGNOSIS — G40309 Generalized idiopathic epilepsy and epileptic syndromes, not intractable, without status epilepticus: Secondary | ICD-10-CM | POA: Diagnosis not present

## 2022-09-22 DIAGNOSIS — F840 Autistic disorder: Secondary | ICD-10-CM | POA: Diagnosis not present

## 2022-09-22 DIAGNOSIS — F5105 Insomnia due to other mental disorder: Secondary | ICD-10-CM

## 2022-09-22 DIAGNOSIS — G40209 Localization-related (focal) (partial) symptomatic epilepsy and epileptic syndromes with complex partial seizures, not intractable, without status epilepticus: Secondary | ICD-10-CM

## 2022-09-22 DIAGNOSIS — R198 Other specified symptoms and signs involving the digestive system and abdomen: Secondary | ICD-10-CM | POA: Insufficient documentation

## 2022-09-22 MED ORDER — GABAPENTIN 250 MG/5ML PO SOLN: 50.0000 mg | Freq: Every day | ORAL | 12 refills | Status: DC

## 2022-09-22 MED ORDER — CLONAZEPAM 0.5 MG PO TBDP: ORAL_TABLET | ORAL | 5 refills | Status: DC

## 2022-09-22 MED ORDER — FELBAMATE 600 MG/5ML PO SUSP: ORAL | 0 refills | Status: DC

## 2022-09-22 NOTE — Patient Instructions (Addendum)
Lets try 1 mL of gabapentin at night to help with his stomach pain. As long as he tolerates it will we can increase to 2 mL.  Talk with GI about starting gabapentin, I wonder if they have any other ideas.  I also think it would be good to talk with them about another option for pain than the tylenol and ibuprofen. In addition, talk with them about taking the mestinon around meals.  I did change the prescription of Klonopin to say it is for seizures and pain.  Continue the Felbatol as is.

## 2022-09-30 MED FILL — WATER FOR IRRIGATION, STERILE SOLUTION, METHYLCELLULOSE 4000CPS (BULK) 30 % POWDER, SODIUM BICARBONATE (BULK) POWDER, PANTOPRAZOLE 40 MG TABLET,DELAYED RELEASE: ORAL | 12 days supply | Qty: 520 | Fill #3

## 2022-10-01 ENCOUNTER — Ambulatory Visit: Admit: 2022-10-01 | Discharge: 2022-10-02 | Payer: MEDICARE

## 2022-10-01 DIAGNOSIS — K5989 Chronic intestinal pseudo-obstruction: Principal | ICD-10-CM

## 2022-10-04 ENCOUNTER — Encounter (INDEPENDENT_AMBULATORY_CARE_PROVIDER_SITE_OTHER): Payer: Self-pay | Admitting: Pediatrics

## 2022-10-04 DIAGNOSIS — K5989 Chronic intestinal pseudo-obstruction: Principal | ICD-10-CM

## 2022-10-04 MED ORDER — PYRIDOSTIGMINE BROMIDE 60 MG/5 ML ORAL SYRUP
Freq: Three times a day (TID) | ORAL | 1 refills | 19 days | Status: CP
Start: 2022-10-04 — End: ?
  Filled 2022-10-06: qty 120, 19d supply, fill #0

## 2022-10-05 DIAGNOSIS — K5989 Chronic intestinal pseudo-obstruction: Principal | ICD-10-CM

## 2022-10-13 DIAGNOSIS — K219 Gastro-esophageal reflux disease without esophagitis: Principal | ICD-10-CM

## 2022-10-13 MED FILL — WATER FOR IRRIGATION, STERILE SOLUTION, METHYLCELLULOSE 4000CPS (BULK) 30 % POWDER, SODIUM BICARBONATE (BULK) POWDER, PANTOPRAZOLE 40 MG TABLET,DELAYED RELEASE: ORAL | 12 days supply | Qty: 520 | Fill #4

## 2022-10-17 ENCOUNTER — Emergency Department: Admit: 2022-10-17 | Discharge: 2022-10-18 | Disposition: A | Discharge status: Home / Self Care | Payer: MEDICARE

## 2022-10-17 ENCOUNTER — Ambulatory Visit: Admit: 2022-10-17 | Discharge: 2022-10-18 | Disposition: A | Discharge status: Home / Self Care | Payer: MEDICARE

## 2022-10-18 ENCOUNTER — Emergency Department: Admit: 2022-10-18 | Discharge: 2022-10-18 | Disposition: A | Discharge status: Home / Self Care | Payer: MEDICARE

## 2022-10-18 ENCOUNTER — Ambulatory Visit: Admit: 2022-10-18 | Discharge: 2022-10-18 | Disposition: A | Discharge status: Home / Self Care | Payer: MEDICARE

## 2022-10-18 DIAGNOSIS — T829XXA Unspecified complication of cardiac and vascular prosthetic device, implant and graft, initial encounter: Principal | ICD-10-CM

## 2022-10-18 DIAGNOSIS — Z789 Other specified health status: Principal | ICD-10-CM

## 2022-10-19 ENCOUNTER — Ambulatory Visit: Admit: 2022-10-19 | Discharge: 2022-10-19 | Payer: MEDICARE

## 2022-10-20 DIAGNOSIS — Z789 Other specified health status: Principal | ICD-10-CM

## 2022-10-22 ENCOUNTER — Encounter: Admit: 2022-10-22 | Discharge: 2022-10-22 | Payer: MEDICARE

## 2022-10-22 ENCOUNTER — Telehealth: Admit: 2022-10-22 | Discharge: 2022-10-22 | Payer: MEDICARE

## 2022-10-22 ENCOUNTER — Ambulatory Visit: Admit: 2022-10-22 | Discharge: 2022-10-22 | Payer: MEDICARE

## 2022-10-22 DIAGNOSIS — Z789 Other specified health status: Principal | ICD-10-CM

## 2022-10-25 MED FILL — WATER FOR IRRIGATION, STERILE SOLUTION, METHYLCELLULOSE 4000CPS (BULK) 30 % POWDER, SODIUM BICARBONATE (BULK) POWDER, PANTOPRAZOLE 40 MG TABLET,DELAYED RELEASE: ORAL | 13 days supply | Qty: 520 | Fill #5

## 2022-10-26 MED FILL — PYRIDOSTIGMINE BROMIDE 60 MG/5 ML ORAL SYRUP: ORAL | 19 days supply | Qty: 120 | Fill #0

## 2022-11-01 ENCOUNTER — Other Ambulatory Visit (INDEPENDENT_AMBULATORY_CARE_PROVIDER_SITE_OTHER): Payer: Self-pay | Admitting: Pediatrics

## 2022-11-01 DIAGNOSIS — G40309 Generalized idiopathic epilepsy and epileptic syndromes, not intractable, without status epilepticus: Secondary | ICD-10-CM

## 2022-11-01 DIAGNOSIS — G40209 Localization-related (focal) (partial) symptomatic epilepsy and epileptic syndromes with complex partial seizures, not intractable, without status epilepticus: Secondary | ICD-10-CM

## 2022-11-10 MED FILL — WATER FOR IRRIGATION, STERILE SOLUTION, METHYLCELLULOSE 4000CPS (BULK) 30 % POWDER, SODIUM BICARBONATE (BULK) POWDER, PANTOPRAZOLE 40 MG TABLET,DELAYED RELEASE: ORAL | 13 days supply | Qty: 520 | Fill #6

## 2022-11-11 ENCOUNTER — Ambulatory Visit (INDEPENDENT_AMBULATORY_CARE_PROVIDER_SITE_OTHER): Payer: Medicare Other | Admitting: Pediatrics

## 2022-11-14 DIAGNOSIS — K5989 Chronic intestinal pseudo-obstruction: Principal | ICD-10-CM

## 2022-11-15 DIAGNOSIS — K5989 Chronic intestinal pseudo-obstruction: Principal | ICD-10-CM

## 2022-11-15 MED ORDER — PYRIDOSTIGMINE BROMIDE 60 MG/5 ML ORAL SYRUP
Freq: Three times a day (TID) | GASTROSTOMY | 3 refills | 90 days | Status: CP
Start: 2022-11-15 — End: 2023-11-15

## 2022-11-16 DIAGNOSIS — K5989 Chronic intestinal pseudo-obstruction: Principal | ICD-10-CM

## 2022-11-16 MED ORDER — PYRIDOSTIGMINE BROMIDE 60 MG/5 ML ORAL SYRUP
Freq: Three times a day (TID) | ORAL | 1 refills | 19 days | Status: CP
Start: 2022-11-16 — End: ?

## 2022-11-18 DIAGNOSIS — K219 Gastro-esophageal reflux disease without esophagitis: Principal | ICD-10-CM

## 2022-11-19 ENCOUNTER — Ambulatory Visit: Admit: 2022-11-19 | Discharge: 2022-11-20 | Payer: MEDICARE

## 2022-11-19 DIAGNOSIS — K580 Irritable bowel syndrome with diarrhea: Principal | ICD-10-CM

## 2022-11-19 MED ORDER — RIFAXIMIN 20 MG/ML ORAL SUSPENSION WAM
Freq: Three times a day (TID) | GASTROSTOMY | 0 refills | 14 days | Status: CP
Start: 2022-11-19 — End: 2022-12-03

## 2022-11-19 MED ORDER — PANTOPRAZOLE 2MG/ML SUS
Freq: Two times a day (BID) | ORAL | 3 refills | 90 days | Status: CP
Start: 2022-11-19 — End: 2023-11-19
  Filled 2022-12-06: qty 480, 12d supply, fill #0

## 2022-11-22 ENCOUNTER — Telehealth: Admit: 2022-11-22 | Discharge: 2022-11-23 | Payer: MEDICARE

## 2022-11-22 MED FILL — WATER FOR IRRIGATION, STERILE SOLUTION, METHYLCELLULOSE 4000CPS (BULK) 30 % POWDER, SODIUM BICARBONATE (BULK) POWDER, PANTOPRAZOLE 40 MG TABLET,DELAYED RELEASE: ORAL | 12 days supply | Qty: 480 | Fill #1

## 2022-11-28 DIAGNOSIS — K580 Irritable bowel syndrome with diarrhea: Principal | ICD-10-CM

## 2022-11-29 ENCOUNTER — Ambulatory Visit: Admit: 2022-11-29 | Discharge: 2022-11-30 | Payer: MEDICARE

## 2022-11-29 DIAGNOSIS — K56609 Unspecified intestinal obstruction, unspecified as to partial versus complete obstruction: Principal | ICD-10-CM

## 2022-12-01 DIAGNOSIS — K580 Irritable bowel syndrome with diarrhea: Principal | ICD-10-CM

## 2022-12-01 MED ORDER — RIFAXIMIN 20 MG/ML ORAL SUSPENSION WAM
Freq: Three times a day (TID) | GASTROSTOMY | 0 refills | 14 days | Status: CP
Start: 2022-12-01 — End: 2022-12-15

## 2022-12-03 DIAGNOSIS — K580 Irritable bowel syndrome with diarrhea: Principal | ICD-10-CM

## 2022-12-03 MED ORDER — RIFAXIMIN 20 MG/ML ORAL SUSPENSION WAM
Freq: Three times a day (TID) | GASTROSTOMY | 0 refills | 14 days | Status: CP
Start: 2022-12-03 — End: 2022-12-17
  Filled 2022-12-15: qty 1155, 14d supply, fill #0

## 2022-12-07 NOTE — Unmapped (Signed)
[facility] Specialty Medication Onboarding    Specialty Medication: rifAXIMin (XIFAXAN) oral suspension 20 mg/mL  Prior Authorization: Approved   Financial Assistance: No - copay  <$25  Final Copay/Day Supply: $0 / 14    Insurance Restrictions: None     Notes to Pharmacist:     The triage team has completed the benefits investigation and has determined that the patient is able to fill this medication at [facility]. Please contact the patient to complete the onboarding or follow up with the prescribing physician as needed.

## 2022-12-08 ENCOUNTER — Ambulatory Visit
Admit: 2022-12-08 | Discharge: 2022-12-09 | Payer: MEDICARE | Attending: Vascular & Interventional Radiology | Primary: Vascular & Interventional Radiology

## 2022-12-08 DIAGNOSIS — T82898A Other specified complication of vascular prosthetic devices, implants and grafts, initial encounter: Principal | ICD-10-CM

## 2022-12-08 NOTE — Unmapped (Addendum)
Patient Name: Elijah Barrett  Patient Age: 37 y.o.  Encounter Date: 12/08/22   Attending Interventional Radiologist: Dr. Trude Mcburney  Resident Interventional Radiologist: NA    Referring Physician: Agustin Cree, ANP  [phone]  [place]  [facility]  [place],  [place] [address]  Primary Care Provider: Agustin Cree, ANP      SUBJECTIVE      Reason for Visit: Follow up    History of Present Illness: Elijah Barrett is a 37 y.o. male with autism, TPN dependence who presents for follow up s/p RIJ double lumen powerline placed on 10/22/22 for TPN. Family has noted small bleeding around insertion site starting shortly after the line was placed. On weekly dressing changes, the family would notice dried blood on the bandage and are concerned about increased infection risk. He occasionally picks at the dressing. Parents also note he sleeps on the side with his line and also has repetitive right arm movements that they think might irritate the insertion site.     Review of Systems: Pertinent positives and negatives are noted in the HPI above. Review of systems otherwise negative.    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    Mental retardation     Seizures (CMS-HCC)        Past Surgical History:  Past Surgical History:   Procedure Laterality Date    COLON SURGERY  ielostomy    09/17/20    FULL DENT RESTOR:MAY INCL ORAL EXM;DENT XRAYS;PROPHY/FL TX;DENT RESTOR;PULP TX;DENT EXTR;DENT AP N/A 05/28/2014    Procedure: FULL DENTAL RESTOR:MAY INCL ORAL EXAM;DENT XRAYS;PROPHY/FL TX;DENT RESTOR;PULP TX;DENT EXTR;DENT APPLIANCES;  Surgeon: Roderick Pee, DDS;  Location: MAIN OR [place];  Service: Oral Medicine    FULL DENT RESTOR:MAY INCL ORAL EXM;DENT XRAYS;PROPHY/FL TX;DENT RESTOR;PULP TX;DENT EXTR;DENT AP Bilateral 10/09/2019    Procedure: R19 FULL DENTAL RESTOR:MAY INCL ORAL EXAM;DENT XRAYS;PROPHY/FL TX;DENT RESTOR;PULP TX;DENT EXTR;DENT APPLIANCES;  Surgeon: Laretta Bolster, DDS;  Location: MAIN OR [place];  Service: Adult Dentistry    IR INSERT G-TUBE PERCUTANEOUS  10/23/2021    IR INSERT G-TUBE PERCUTANEOUS 10/23/2021 Isidoro Donning, MD IMG [facility]    PR ESOPHGL BALO DISTENSION DX STD W/PROVOCATION N/A 06/26/2021    Procedure: ESOPHAGEAL BALLOON DISTENSION STUDY, DIAGNOSTIC WITH PROVOCATION;  Surgeon: Hunt Oris, MD;  Location: [facility]URES [facility];  Service: Gastroenterology    PR ILEOSCOPY THRU STOMA DX W/COLLJ SPEC WHEN PRFMD N/A 05/20/2021    Procedure: ILEOSCOPY-STOMA; DX W/WO COLLEC SPECMN (SEP PRO);  Surgeon: Carmon Ginsberg, MD;  Location: [facility]URES [facility];  Service: Gastroenterology    PR LAP,DIAGNOSTIC ABDOMEN N/A 11/13/2020    Procedure: DIAGNOSTIC AND OR OPERATIVE LAPAROSCOPY;  Surgeon: Estelle June, MD;  Location: MAIN OR [facility];  Service: General Surgery    PR LAP,DIAGNOSTIC ABDOMEN N/A 03/17/2021    Procedure: LAPAROSCOPY, ABDOMEN, PERITONEUM, & OMENTUM, DIAGNOSTIC, W/WO COLLECTION SPECIMEN(S) BY BRUSHING OR WASHING;  Surgeon: Estelle June, MD;  Location: MAIN OR [facility];  Service: Gastrointestinal    PR LAP,DIAGNOSTIC ABDOMEN N/A 02/23/2022    Procedure: DIAGNOSTIC AND OR OPERATIVE LAPAROSCOPY;  Surgeon: Estelle June, MD;  Location: MAIN OR [facility];  Service: Gastrointestinal    PR LAP,DIAGNOSTIC ABDOMEN N/A 07/20/2022    Procedure: DIAGNOSTIC AND OR OPERATIVE LAPAROSCOPY;  Surgeon: Estelle June, MD;  Location: MAIN OR [facility];  Service:  Gastrointestinal    PR PLACE PERCUT GASTROSTOMY TUBE N/A 10/20/2021    Procedure: UGI ENDO; W/DIRECTED PLCMT PERQ GASTROSTOMY TUBE;  Surgeon: Marguarite Arbour, MD;  Location: [facility];  Service: Gastroenterology    PR REMOVAL COLON/PROCTECTOMY/ILEOSTOMY N/A 09/17/2020    Procedure: COLECTOMY, TOTAL, ABDOMINAL, WITH PROCTECTOMY; WITH ILEOSTOMY;  Surgeon: Estelle June, MD;  Location: MAIN OR [facility];  Service: General Surgery    PR REMOVAL OF OMENTUM N/A 03/17/2021    Procedure: OMENTECTOMY, EPIPLOECTOMY, RESECTION OF OMENTUM;  Surgeon: Estelle June, MD;  Location: MAIN OR [place];  Service: Gastrointestinal    PR REVISION OF COLOSTOMY,SIMPLE N/A 07/20/2022    Procedure: REVISION OF COLOSTOMY; SIMPLE (RELEASE OF SUPERFICIAL SCAR) (SEPARATE PROCEDURE);  Surgeon: Estelle June, MD;  Location: MAIN OR [facility];  Service: Gastrointestinal    PR REVISION OF ILEOSTOMY,SIMPLE N/A 02/23/2022    Procedure: REVIS ILEOSTOMY; SIMPL (SEPART PROC);  Surgeon: Estelle June, MD;  Location: MAIN OR [facility];  Service: Gastrointestinal    PR SGMDSC FLX W/DCMPRN W/PLMT [employer] TUBE N/A 09/15/2020    Procedure: SIGMOIDOSCOPY FLEX; W/DECOMP VOLVULUS ANY METHD;  Surgeon: Andrey Farmer, MD;  Location: GI PROC[facility]URES [facility];  Service: Gastroenterology    PR UPPER GI ENDOSCOPY,BIOPSY N/A 06/26/2021    Procedure: UGI ENDOSCOPY; WITH BIOPSY, SINGLE OR MULTIPLE;  Surgeon: Hunt Oris, MD;  Location: GI PROC[facility]URES [facility];  Service: Gastroenterology    PR UPPER GI ENDOSCOPY,BIOPSY N/A 10/16/2021    Procedure: UGI ENDOSCOPY; WITH BIOPSY, SINGLE OR MULTIPLE;  Surgeon: Candida Peeling, MD;  Location: GI PROC[facility]URES [facility];  Service: Gastroenterology    PR UPPER GI ENDOSCOPY,TUBE PLACE N/A 10/16/2021    Procedure: UGI ENDO; W/TRANSENDOSCOPIC TUBE/CATH PLCMT;  Surgeon: Candida Peeling, MD;  Location: [facility];  Service: Gastroenterology    PR UPPER GI ENDOSCOPY,W/DIR SUBMUC INJ N/A 07/23/2021    Procedure: UGI ENDO, INCL ESOPH/STOMACH/& EITHER DUODENUM AND/OR JEJUNUM AS APPROP; WITH DIRECTED SUBMUCOSAL INJECTION;  Surgeon: Beverly Milch, MD;  Location: [facility];  Service: Gastrointestinal    SMALL INTESTINE SURGERY  small bowel obstruction    11/12/2020    WISDOM TOOTH EXTRACTION         Family History:  Family History   Problem Relation Age of Onset    Diabetes Mother     Hypertension Mother     Diabetes Father     Hypertension Father         Allergies:  Allergies   Allergen Reactions    Lactose      Lactose intolerant    Upset stomach, excessive gas    Vimpat [Lacosamide]      Elevated liver enzymes after starting TPN & Vimpat        Anticoagulant/Antiplatelet Medications: No anticoagulant medications    OBJECTIVE     Physical Exam:  General: in no acute distress  HEENT: normocephalic, atraumatic, anicteric sclera  Respiratory: breathing comfortably on room air  Skin: R chest tunneled line in place, sutures intact, cuff palpable 2 cm above tunnel exit site    Point-of-care US performed: Not performed    Pertinent Laboratory Values:  Lab Results   Component Value Date    WBC 5.6 08/14/2022    HGB 10.6 (L) 08/14/2022    HCT 32.0 (L) 08/14/2022    PLT 306 08/14/2022     Lab Results   Component Value Date    APTT 35.1 02/22/2022  Pertinent Imaging Studies: No relevant prior imaging is available for review.    ASSESSMENT/PLAN     Elijah Barrett is a 37 y.o. male s/p RIJ double lumen powerline placement on 10/22/22 for TPN with small amount of bleeding around insertion site since placement. The small amount of bleeding is likely from irritation of insertion site during dressing changes/patient motion.    The patient's questions were all answered to his satisfaction.   --Reassured parents that bleeding from line site is not concerning given that it is a small amount associated with catheter manipulation and that it is likely just due to irritation of the site. Offered to cut the suture and re-suture but family declined.   --Instructed to continue weekly dressing changes  --Continue line use as required  --Follow up with VIR only as needed    Patient seen and discussed with Dr. Trude Mcburney, who agrees with the above assessment and plan. Thank you for involving [place] in the care of this patient.    Casimiro Needle  [facility] MS4  12/08/22 8:29 AM

## 2022-12-08 NOTE — Unmapped (Signed)
I saw and evaluated the patient with the medical student and agree with the findings and plan as detailed in his note.    This visit was conducted in person.    Trude Mcburney, MD

## 2022-12-09 NOTE — Unmapped (Addendum)
Specialty Medication(s): Xifaxan 550mg     Mr.Lory has been dis-enrolled from the [facility] specialty pharmacy services due to the upcoming therapy completion - expected therapy completion date: 12/29/22 based on the patient receiving and starting the medication on 12/15/22 .    Additional information provided to the patient: n/a    Roderic Palau, PharmD  [facility] Specialty Pharmacist

## 2022-12-09 NOTE — Unmapped (Signed)
[facility]   Specialty Lite Counseling    Mr.Elijah Barrett is a 37 y.o. 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  I am speaking to the patient's family member, Elijah Barrett (mother/HCPOA) .    Was a Nurse, learning disability used for this call? No    Verified patient's date of birth / HIPAA.    Specialty Lite medication(s) to be sent: Infectious Disease: Xifaxan      Non-specialty medications/supplies to be sent: n/a      Medications not needed at this time: n/a         An offer to provide counseling to the patient's mother regarding her son's medication was made. The patient's mother declined counseling      Current Medications (including OTC/herbals), Comorbidities and Allergies     Current Outpatient Medications   Medication Sig Dispense Refill    acetaminophen (TYLENOL) 160 mg/5 mL (5 mL) suspension 15.6 mL (500 mg total) by G-tube route every eight (8) hours as needed for fever or pain. Medication available over-the-counter, no prescription provided. Take as directed on label 1 mL 0    clonazePAM (KLONOPIN) 0.5 MG disintegrating tablet 1 tablet (0.5 mg total) by G-tube route two (2) times a day as needed.      diazePAM (VALTOCO) 20 mg/2 spray (10mg /0.52mL x2) Spry 1 spray into each nostril as needed.      felbamate (FELBATOL) 600 mg/5 mL suspension 8 mL (960 mg total) by G-tube route two (2) times a day.      gabapentin (NEURONTIN) 250 mg/5 mL oral solution       MELATONIN ORAL Take 1 mg by mouth.      multivit with min-folic acid 200 mcg Chew Chew 2 tablets daily.      pantoprazole (Protonix) 2 mg/mL oral suspension Take 20 mL (40 mg total) by mouth Two (2) times a day. 3600 mL 3    prucalopride (MOTEGRITY) 1 mg Tab Take 1 mg by mouth daily.      pyridostigmine (MESTINON) 60 mg/5 mL syrup 2.1 mL (25 mg total) by G-tube route Three (3) times a day. 567 mL 3    pyridostigmine (MESTINON) 60 mg/5 mL syrup Take 2.1 mL (25 mg total) by mouth Three (3) times a day. 120 mL 1    rifAXIMin (XIFAXAN) oral suspension 20 mg/mL 27.5 mL (550 mg total) by G-tube route Three (3) times a day for 14 days. 1155 mL 0    rifAXIMin (XIFAXAN) oral suspension 20 mg/mL 27.5 mL (550 mg total) by G-tube route Three (3) times a day for 14 days. 1155 mL 0    simethicone (MYLICON) 40 mg/0.6 mL drops Take 0.6 mL (40 mg total) by mouth four (4) times a day as needed.       No current facility-administered medications for this visit.       Allergies   Allergen Reactions    Lactose      Lactose intolerant    Upset stomach, excessive gas    Vimpat [Lacosamide]      Elevated liver enzymes after starting TPN & Vimpat       Patient Active Problem List   Diagnosis    Seizure disorder (CMS-HCC)    Autism    Generalized convulsive epilepsy (CMS-HCC)    Intermittent explosive disorder    Mental and behavioral problem    Sleep arousal disorder    GERD (gastroesophageal reflux disease)    Neurocognitive disorder  Transaminitis    Failure to thrive in adult    Achalasia    Hepatotoxicity, secondary to TPN    Melena    Abdominal distension    Abdominal pain    Acute esophagitis    Acute metabolic encephalopathy    Aspiration pneumonia of both lower lobes due to gastric secretions (CMS-HCC)    Constipation    Decreased oral intake    Diplegia (CMS-HCC)    Dysphagia    Elevated liver enzymes    Hypokalemia    Hypomagnesemia    Ileostomy in place (CMS-HCC)    PEG (percutaneous endoscopic gastrostomy) status (CMS-HCC)    Insomnia, unspecified    Mental and behavioral problem in adult    Misophonia    Moderate intellectual disabilities    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    Appropriateness of Therapy     Prescription has been clinically reviewed: Yes    Financial Information     Medication Assistance provided: Prior Authorization    Anticipated copay of $0.00 reviewed with patient.     Patient Specific Needs     Does the patient have any physical, cognitive, or cultural barriers?  Yes, non-verbal autistic with a seizure disorder and is in a wheelchair.    Does the patient have adequate living arrangements? (i.e. the ability to store and take their medication appropriately) Yes    Did you identify any home environmental safety or security hazards? No    Patient prefers to have medications discussed with   either his mother or father      Is the patient or caregiver able to read and understand education materials at a high school level or above? Yes    Patient's primary language is  English     Is the patient high risk? No    SOCIAL DETERMINANTS OF HEALTH     At the [facility], we have learned that life circumstances - like trouble affording food, housing, utilities, or transportation can affect the health of many of our patients.   That is why we wanted to ask: are you currently experiencing any life circumstances that are negatively impacting your health and/or quality of life? Patient declined to answer    Social Determinants of Health     Financial Resource Strain: Low Risk  (07/19/2022)    Overall Financial Resource Strain (CARDIA)     Difficulty of Paying Living Expenses: Not very hard   Internet Connectivity: Not on file   Food Insecurity: No Food Insecurity (07/19/2022)    Hunger Vital Sign     Worried About Running Out of Food in the Last Year: Never true     Ran Out of Food in the Last Year: Never true   Tobacco Use: Low Risk  (12/08/2022)    Patient History     Smoking Tobacco Use: Never     Smokeless Tobacco Use: Never     Passive Exposure: Not on file   Housing/Utilities: Low Risk  (07/19/2022)    Housing/Utilities Within the  past 12 months, have you ever stayed: outside, in a car, in a tent, in an overnight shelter, or temporarily in someone else's home (i.e. couch-surfing)?: No     Are you worried about losing your housing?: No     Within the past 12 months, have you been unable to get utilities (heat, electricity) when it was really needed?: No   Alcohol Use: Not on file   Transportation Needs: No Transportation Needs (07/19/2022)    PRAPARE - Therapist, art (Medical): No     Lack of Transportation (Non-Medical): No   Substance Use: Not on file   Health Literacy: Not on file   Physical Activity: Not on file   Interpersonal Safety: Not on file   Stress: Not on file   Intimate Partner Violence: Not on file   Depression: Not on file   Social Connections: Not on file       Would you be willing to receive help with any of the needs that you have identified today? Not applicable    Delivery Information     Verified delivery address.    Scheduled delivery date: 12/15/22    Expected start date: 12/15/22    Medication will be delivered via Same Day Courier to the prescription address in [facility].  This shipment will not require a signature.      Explained the services we provide at [facility] and that each month we will send text messages and/or mychart messages to set up refills. Informed patient that refills should be scheduled 7-10 days prior to when they will run out of medication. Informed patient that a welcome packet, containing information about our pharmacy and other support services, a Notice of Privacy Practices, and a drug information handout will be sent.      The patient or caregiver noted above participated in the development of this care plan and knows that they can request review of or adjustments to the care plan at any time.      Patient or caregiver verbalized understanding of the above information as well as how to contact the pharmacy at [phone] option 4 with any questions/concerns.  The pharmacy is open Monday through Friday 8:30am-4:30pm.  A pharmacist is available 24/7 via pager to answer any clinical questions they may have.      Roderic Palau, PharmD  [facility] Specialty Pharmacist

## 2022-12-16 MED FILL — WATER FOR IRRIGATION, STERILE SOLUTION, METHYLCELLULOSE 4000CPS (BULK) 30 % POWDER, SODIUM BICARBONATE (BULK) POWDER, PANTOPRAZOLE 40 MG TABLET,DELAYED RELEASE: ORAL | 12 days supply | Qty: 480 | Fill #1

## 2022-12-16 NOTE — Unmapped (Addendum)
Called and spoke to patient's mother to advise that nurse spoke with [facility] and was advised that patient remains in network. Patient's mother states that every time she call [employer] she is told something different. Patient's mother states she will call [employer] again for clarification.     Nurse advised  patient's mother that we have been notified that certain [employer] benefits are OON. In these cases, we could move the patient to other infusion services or if the patient wants to stay with [facility] they would need to pay directly for services. Patient's mother verbalized understanding but request a OON Exception.     Patient has enough TPN supplies through Sunday. Nurse continuing to work with [facility] for next steps.

## 2022-12-16 NOTE — Unmapped (Signed)
Called [employer] provider line at [phone] per request of patient's mother. Nurse advised that [facility] remains in network and no out of network exception form needed for patient's care. Nurse requested out of network exception form to be faxed in case it will be needed in the future.   Nurse provided reference number [id number].

## 2022-12-17 NOTE — Unmapped (Signed)
Nurse notified by Carollee Leitz that patient will need to transfer to another infusion pharmacy as [facility] is OON for [facility]. Referral loaded to media tab and faxed to [facility] at [phone].

## 2022-12-17 NOTE — Unmapped (Signed)
Called patient's mother and advised that the medicare plan is OON and he has no out of network benefits,  will be unable to service the patient further and will need to send his prescriptions to another home infusion company. Patient's mother advised that orders have been sent to [facility]. Nurse provided telephone and fax number to patient's mother. Patient's mother verbalized understanding.      Nurse called [facility] and spoke to [facility] who confirmed that referral has been received. Per Marylene Land the next steps will be for the nutritionist to review orders and if anything is needed further they will call. Nurse provided direct contact number and Marylene Land verbalized understanding.

## 2022-12-22 NOTE — Progress Notes (Signed)
Patient: Jonathan Kirby MRN: [id number] Sex: male DOB: [birth year]  Provider: Lorenz Coaster, MD Location of Care: Pediatric [facility]- Pediatric Complex Care Note type: Routine return visit  History of Present Illness: Referral Source: [name], MD History from: patient and prior records Chief Complaint: complex care intake   Jonathan Kirby is a 37 y.o. male with history of  autism and focal epilepsy who I am seeing in follow-up for complex care management. Patient was last seen 09/22/22 where I started gabapentin and continued felbatol, I also recommended continuing to use Klonopin for seizure or pain.  Since that appointment, patient has been seen in the ED on 10/17/22 and on 10/18/22 for occluded PICC line.   Patient presents today with his parents. They report the following:   Symptom management:  Has had several seizures, four, since the last visit. Has moments of arm stiffening and eye deviation.They last less than a minute. Mom gives Klonopin with these events and he   Switched to tunnelled catheter.  However, behaviorally he continues to pull at it. Tried a vest, different dressings.  Holding off on putting on mits. Would be interested in eblbow splints or other methods to prevent him. They continue to try to give him Nutren 2.0 but he is not tolerating fast speeds. Gotten to 60 mL/hr. Considering starting motegrity or increasing mestinon.   They have been giving 2 mL of gabapentin and at first they feel that this helped him sleep. However, since he has been on it a while, he no longer is sleepy on this dose.   Has gone back to waking up at 3:30 in the morning. Mom feels this is related to GI pain. She does continue to give 3 mg of melatonin.   Care coordination (other providers): He saw Dr. Carmon Sails for GI on 10/01/22 and 11/19/22 who recommended increasing g-tube feeds with a plan to start weaning TPN.  Another course of Rifaximin  PO TID liquid x 14 days prescribed for SIBO.  They consolidated his TPN feeds from 12 to 10 hrs. He is also getting of Nutren 2.0 most days   He also saw Dr. Miachel Roux for Gen surgery on 11/29/22 who recomeded continuing to have GI concerns managed by medical providers.   They also saw interventional radiology 12/08/22 for concerns around his PICC, however, not concerning.   Equipment needs:  Parents would be interested in an elbow splint or a SPIO vest to prevent Jonathan Kirby from pulling at his tunneled catheter.   Diagnostics/Patient history:  He had a mixture of complex partial seizures with secondary generalization, and myoclonic seizures.  These were quite frequent, and caused significant impairment in his function.  Once we were able to bring seizures under control Felbatol, he showed a great deal of anxiety and depression.  This has improved as he has become older.  For the most part his seizures have been well controlled on Felbatol.  Most recurrent seizures have occurred in the setting of forgotten doses of medication.  He also has problems with constipation.   EEG 08/28/20 Impression:  This study is consistent with patient's known history of epilepsy, which could be fragments of generalized epilepsy or multifocal epilepsy.  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  Past Medical History Past Medical History:  Diagnosis Date   Autism    GERD (gastroesophageal reflux disease)    Seizures     Surgical History Past Surgical History:  Procedure Laterality Date   CIRCUMCISION  1987   ESOPHAGOGASTRODUODENOSCOPY N/A 04/17/2020   Procedure: ESOPHAGOGASTRODUODENOSCOPY (EGD);  Surgeon: Pasty Spillers, MD;  Location: [facility] ENDOSCOPY;  Service: Endoscopy;  Laterality: N/A;   ESOPHAGOGASTRODUODENOSCOPY (EGD) WITH PROPOFOL N/A 08/28/2020   Procedure: ESOPHAGOGASTRODUODENOSCOPY (EGD) WITH PROPOFOL;  Surgeon: Toney Reil, MD;  Location: [facility]  ENDOSCOPY;  Service: Gastroenterology;  Laterality: N/A;   STOMA REVISION  08/2022    Family History family history includes Alzheimer's disease in his maternal grandfather; Cancer in his maternal grandmother; Stroke in his paternal grandfather.   Social History Social History   Social History Narrative   Jonathan Kirby does not attend any school or day program at this time.    He lives with his parents.    He enjoys listening to music, playing with remote control cars, and collecting newspapers.    Allergies Allergies  Allergen Reactions   Lactose Intolerance (Gi)     Upset stomach, excessive gas   Vimpat [Lacosamide]     Elevated liver enzymes after starting TPN & Vimpat    Medications Current Outpatient Medications on File Prior to Visit  Medication Sig Dispense Refill   clonazePAM (KLONOPIN) 0.5 MG disintegrating tablet TAKE 1/2 TABLET DISSOLVE IN WATER AND EVERY 8 HOURS AS NEEDED FOR SEIZURES OR IRRITABILITY 30 tablet 5   melatonin 1 MG TABS tablet Take 3 tablets (3 mg total) by mouth at bedtime as needed. 90 tablet 5   pantoprazole sodium (PROTONIX) 40 mg Take 40 mg by mouth daily.     pyridostigmine (MESTINON) 60 MG/5ML solution Take 1.3 mLs by mouth 3 (three) times daily.     simethicone (MYLICON) 40 MG/0.6ML drops Take 40 mg by mouth every 6 (six) hours as needed.     Prucalopride Succinate (MOTEGRITY) 1 MG TABS Take by mouth. (Patient not taking: Reported on 12/27/2022)     XIFAXAN 550 MG TABS tablet Take by mouth. (Patient not taking: Reported on 12/27/2022)     No current facility-administered medications on file prior to visit.   The medication list was reviewed and reconciled. All changes or newly prescribed medications were explained.  A complete medication list was provided to the patient/caregiver.  Physical Exam BP 120/78   Pulse 88   Temp (!) 96.8 F (36 C) (Other (Comment)) Comment (Src): forehead  Resp 16   Wt 112 lb (50.8 kg) Comment: reported- him in the chair  136.2 but chair does not have foot pieces  SpO2 100%   BMI 18.64 kg/m  Weight for age: Facility age limit for growth %iles is 20 years.  Length for age: Facility age limit for growth %iles is 20 years. BMI: Body mass index is 18.64 kg/m. No results found. Gen: well appearing neuroaffected young man Skin: No rash, No neurocutaneous stigmata. HEENT: Normocephalic, no dysmorphic features, no conjunctival injection, nares patent, mucous membranes moist, oropharynx clear.  Neck: Supple, no meningismus. No focal tenderness. Resp: Clear to auscultation bilaterally CV: Regular rate, normal S1/S2, no murmurs, no rubs Abd: BS present, abdomen soft, non-tender, non-distended. No hepatosplenomegaly or mass.  Ext: Warm and well-perfused. No deformities, no muscle wasting, ROM full.  Neurological Examination: MS: Awake, alert.  Nonverbal, withdraws from engagement and grunts.   Cranial Nerves: Pupils were equal and reactive to light;  No clear visual field defect, no nystagmus; no ptsosis, face symmetric with full strength of facial muscles, hearing grossly intact, palate elevation is symmetric. Motor-Fairly normal tone throughout, moves extremities at least antigravity. No abnormal movements  Reflexes- Reflexes 2+ and symmetric in the biceps, triceps, patellar and achilles tendon. Plantar responses flexor bilaterally, no clonus noted Sensation: Responds to touch in all extremities.  Coordination: Does not reach for objects.  Gait: sitting in wheelchair, gait deferred.      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 Generalized convulsive epilepsy      Assessment and Plan Jonathan Kirby is a 37 y.o. male with history of  autism and focal epilepsy who presents for follow-up in the pediatric complex care clinic.  Patient seen by case manager, dietician, integrated behavioral health today as well, please see accompanying notes.  I discussed case with all involved parties for  coordination of care and recommend patient follow their instructions as below.   Symptom management:  I am concerned that Jonathan Kirby his having more breakthrough seizures. To address this, will increase Felbatol to 10 mL BID today. Discussed with mom that this can further be maximized to 12 mL PRN for continued seizures. To address GI pain, causing behavioral outbursts, will increase Gabapentin to TID dosing. Parents reports of decreased sedation on medication indicate that he has adjusted to the dose and will likely not experience sedation during the day. Discussed with his family that in the future I can continue to increase the night dose of Gabapentin, which can improve pain control and have an added effect of sleeping through the night. Recommend the family continue to use Klonopin PRN for pain and for seizure clusters. Refilled this today.   - Increase Felbatol to 10 mL BID  - Increase Gabapentin to 2 mL TID  - Refilled Klonopin  Care coordination: - Mom to follow up with GI about using a SPIO vest to protect his tunnelled catheter. Specifically to ask if that compression would be medically safe.   Equipment needs:  - Patient would medically benefit from a device to prevent him from pulling on disturbing his tunneled catheter to prevent skin irritation and infection. This could include a SPIO vest or elbow splints. Plan for family to discuss with nursing staff and GI to determine which device would be best.  - Due to patient's medical condition, patient is indefinitely incontinent of stool and urine.  It is medically necessary for them to use diapers, underpads, and gloves to assist with hygiene and skin integrity.  They require a frequency of up to 200 a month.   Decision making/Advanced care planning: - Not addressed at this visit, patient remains at full code.    The CARE PLAN for reviewed and revised to represent the changes above.  This is available in Epic under snapshot, and a physical  binder provided to the patient, that can be used for anyone providing care for the patient.   I spent 40 minutes on day of service on this patient including review of chart, discussion with patient and family, discussion of screening results, coordination with other providers and management of orders and paperwork.     Return in about 3 months (around 03/28/2023).  I, Mayra Reel, scribed for and in the presence of Lorenz Coaster, MD at today's visit on 12/27/2022.   I, Lorenz Coaster MD MPH, personally performed the services described in this documentation, as scribed by Mayra Reel in my presence on 12/27/2022 and it is accurate, complete, and reviewed by me.    Lorenz Coaster MD MPH Neurology,  Neurodevelopment and Neuropalliative care [facility] Child Neurology  [address] [place], [place], [place]  [address] Phone: [phone] Fax: [phone]

## 2022-12-27 ENCOUNTER — Encounter (INDEPENDENT_AMBULATORY_CARE_PROVIDER_SITE_OTHER): Payer: Self-pay | Admitting: Pediatrics

## 2022-12-27 ENCOUNTER — Ambulatory Visit (INDEPENDENT_AMBULATORY_CARE_PROVIDER_SITE_OTHER): Payer: Medicare HMO | Admitting: Pediatrics

## 2022-12-27 DIAGNOSIS — F840 Autistic disorder: Secondary | ICD-10-CM

## 2022-12-27 DIAGNOSIS — G40309 Generalized idiopathic epilepsy and epileptic syndromes, not intractable, without status epilepticus: Secondary | ICD-10-CM | POA: Diagnosis not present

## 2022-12-27 DIAGNOSIS — G40209 Localization-related (focal) (partial) symptomatic epilepsy and epileptic syndromes with complex partial seizures, not intractable, without status epilepticus: Secondary | ICD-10-CM

## 2022-12-27 MED ORDER — FELBAMATE 600 MG/5ML PO SUSP: ORAL | 5 refills | Status: DC

## 2022-12-27 MED ORDER — GABAPENTIN 250 MG/5ML PO SOLN: 100.0000 mg | Freq: Three times a day (TID) | ORAL | 12 refills | Status: DC

## 2022-12-27 NOTE — Patient Instructions (Addendum)
Increase his Felbatol to 10 mL twice a day. Do this for one week. Before changing the gabapentin and watch his sleep.  Increase his gabapentin to 2 mL three times a day, with each dose of his mestinon.  I refilled his Klonopin today.  Talk to GI about trying a SPIO vest (ask them about this compression) or elbow splints to prevent him from picking at his tunneled catheter.

## 2022-12-30 MED FILL — WATER FOR IRRIGATION, STERILE SOLUTION, METHYLCELLULOSE 4000CPS (BULK) 30 % POWDER, SODIUM BICARBONATE (BULK) POWDER, PANTOPRAZOLE 40 MG TABLET,DELAYED RELEASE: ORAL | 12 days supply | Qty: 480 | Fill #2

## 2022-12-31 ENCOUNTER — Encounter (INDEPENDENT_AMBULATORY_CARE_PROVIDER_SITE_OTHER): Payer: Self-pay | Admitting: Pediatrics

## 2022-12-31 ENCOUNTER — Other Ambulatory Visit (INDEPENDENT_AMBULATORY_CARE_PROVIDER_SITE_OTHER): Payer: Self-pay | Admitting: Pediatrics

## 2022-12-31 NOTE — Telephone Encounter (Signed)
Last OV: 12-27-2022  Next OV: 04-04-2023  Last Rx: 01-14-2022  Sent to on call provider due to this being Emergency Medication.  B. Roten CMA

## 2023-01-05 NOTE — Unmapped (Signed)
[facility] care orders reviewed, signed by Dr. Carmon Sails, loaded to media tab, and faxed back to [phone] and [phone].

## 2023-01-09 ENCOUNTER — Encounter (INDEPENDENT_AMBULATORY_CARE_PROVIDER_SITE_OTHER): Payer: Self-pay | Admitting: Pediatrics

## 2023-01-10 ENCOUNTER — Encounter (INDEPENDENT_AMBULATORY_CARE_PROVIDER_SITE_OTHER): Payer: Self-pay | Admitting: Pediatrics

## 2023-01-10 MED FILL — WATER FOR IRRIGATION, STERILE SOLUTION, METHYLCELLULOSE 4000CPS (BULK) 30 % POWDER, SODIUM BICARBONATE (BULK) POWDER, PANTOPRAZOLE 40 MG TABLET,DELAYED RELEASE: ORAL | 12 days supply | Qty: 480 | Fill #3

## 2023-01-14 ENCOUNTER — Ambulatory Visit: Admit: 2023-01-14 | Discharge: 2023-01-15 | Payer: MEDICARE

## 2023-01-14 DIAGNOSIS — K5989 Chronic intestinal pseudo-obstruction: Principal | ICD-10-CM

## 2023-01-14 DIAGNOSIS — K219 Gastro-esophageal reflux disease without esophagitis: Principal | ICD-10-CM

## 2023-01-14 MED ORDER — PANTOPRAZOLE 2MG/ML SUSPENSION
Freq: Two times a day (BID) | GASTROENTERAL | 11 refills | 30 days | Status: CP
Start: 2023-01-14 — End: 2024-01-14
  Filled 2023-02-10: qty 480, 12d supply, fill #0

## 2023-01-14 MED ORDER — PYRIDOSTIGMINE BROMIDE 60 MG/5 ML ORAL SYRUP
Freq: Three times a day (TID) | GASTROSTOMY | 3 refills | 90 days | Status: CP
Start: 2023-01-14 — End: 2024-01-14

## 2023-01-14 MED ORDER — GABAPENTIN 250 MG/5ML PO SOLN: ORAL | 12 refills | Status: DC

## 2023-01-14 NOTE — Unmapped (Unsigned)
[facility]  Return Visit     Reason for visit: Chronic intestinal pseudoobstruction     Assessment and plan:  Elijah Barrett is a 37 y.o. 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      Elijah Barrett will continue home parenteral nutrition 7 days a week. I has messaged his home health pharmacist regarding his recent labs, getting more consistent lab results, and to change his infusion time from 12 to 10 hours.      I changed his 43F gastrostomy tube today in clinic. His mom brought the replacement tube from home. She will request additional tubes. They will continue to supplement his nutrition with of Nutren 2.0 and he will eat for comfort. We will start to taper down on the PN if he is doing well at the next visit.      He will follow up with Dr. Neysa Hotter to assess the intermittent stoma retraction (not today in clinic). I will message to Dr. Neysa Hotter to make that follow up.      He has continued on Mestinon 25mg  by gastrostomy tube three times daily. We will consider increasing after the visit with Dr. Neysa Hotter.      Another course of Rifaximin 550mg  PO TID liquid x 14 days prescribed for SIBO.     All questions answered. Follow up 1 month.      I personally spent 45 minutes face-to-face and non-face-to-face in the care of this patient, which includes all pre, intra, and post visit time on the date of service.  All documented time was specific to the E/M visit and does not include any procedures that may have been performed.     History of present illness: Elijah Barrett is a 37 y.o. 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 Please see prior notes for a more detailed history.      He was last seen 10/22/22. In the interim, he has continued on PN 7 days a week and is infusing during the day. His mom had to start late or stop the infusion early by a few hours when Elijah Barrett was in pain and agitated. She was afraid that he would pull out the line. He did pull out his PICC line end of January. He now has a tunneled Power line. The line was bleeding for a few weeks after placement but is better now. His ostomy is retracting again and his output intermittently drops off significantly. His mom has observed that he is very uncomfortable during these times. He has been eating some and has gained a few pounds. He is also getting of Nutren 2.0 most days. He has continued on Mestinon 25mg  by gastrostomy tube three times daily.      They have not tried but have the Motegrity 1mg .       Medications:  Reviewed in Epic     Vital signs:    11/19/2022 1:07 PM     BP 108/77   Pulse 88   Temp 36.4 ??C (97.6 ??F)   Temp src Temporal  Weight 52.6 kg (115 lb 14.4 oz)   Height 165.1 cm (5' 5)   Pain Score 0-No pain      Physical exam:  Constitutional: Alert, no acute distress, thin, and well hydrated.  Mental Status: Pleasantly interactive.   HEENT:  Conjunctiva clear, anicteric, oropharynx clear.   Abdomen: Soft, non-distended, non-tender, standard profile PEG in place, stoma CDI, ostomy with output during the visit of liquid brown stool.  Ext: PICC

## 2023-01-20 MED FILL — WATER FOR IRRIGATION, STERILE SOLUTION, METHYLCELLULOSE 4000CPS (BULK) 30 % POWDER, SODIUM BICARBONATE (BULK) POWDER, PANTOPRAZOLE 40 MG TABLET,DELAYED RELEASE: ORAL | 12 days supply | Qty: 480 | Fill #4

## 2023-01-31 MED FILL — WATER FOR IRRIGATION, STERILE SOLUTION, METHYLCELLULOSE 4000CPS (BULK) 30 % POWDER, SODIUM BICARBONATE (BULK) POWDER, PANTOPRAZOLE 40 MG TABLET,DELAYED RELEASE: ORAL | 12 days supply | Qty: 480 | Fill #5

## 2023-02-21 MED FILL — WATER FOR IRRIGATION, STERILE SOLUTION, METHYLCELLULOSE 4000CPS (BULK) 30 % POWDER, SODIUM BICARBONATE (BULK) POWDER, PANTOPRAZOLE 40 MG TABLET,DELAYED RELEASE: ORAL | 12 days supply | Qty: 480 | Fill #6

## 2023-03-03 MED FILL — WATER FOR IRRIGATION, STERILE SOLUTION, METHYLCELLULOSE 4000CPS (BULK) 30 % POWDER, SODIUM BICARBONATE (BULK) POWDER, PANTOPRAZOLE 40 MG TABLET,DELAYED RELEASE: ORAL | 12 days supply | Qty: 480 | Fill #7

## 2023-03-04 ENCOUNTER — Ambulatory Visit: Admit: 2023-03-04 | Discharge: 2023-03-05 | Payer: MEDICARE

## 2023-03-10 ENCOUNTER — Ambulatory Visit: Admit: 2023-03-10 | Discharge: 2023-03-11 | Payer: MEDICARE | Attending: Surgery | Primary: Surgery

## 2023-03-10 DIAGNOSIS — K5989 Chronic intestinal pseudo-obstruction: Principal | ICD-10-CM

## 2023-03-11 DIAGNOSIS — K56609 Unspecified intestinal obstruction, unspecified as to partial versus complete obstruction: Principal | ICD-10-CM

## 2023-03-11 DIAGNOSIS — K56699 Other intestinal obstruction unspecified as to partial versus complete obstruction: Principal | ICD-10-CM

## 2023-03-15 MED FILL — WATER FOR IRRIGATION, STERILE SOLUTION, METHYLCELLULOSE 4000CPS (BULK) 30 % POWDER, SODIUM BICARBONATE (BULK) POWDER, PANTOPRAZOLE 40 MG TABLET,DELAYED RELEASE: ORAL | 12 days supply | Qty: 480 | Fill #8

## 2023-03-28 MED FILL — WATER FOR IRRIGATION, STERILE SOLUTION, METHYLCELLULOSE 4000CPS (BULK) 30 % POWDER, SODIUM BICARBONATE (BULK) POWDER, PANTOPRAZOLE 40 MG TABLET,DELAYED RELEASE: ORAL | 12 days supply | Qty: 480 | Fill #9

## 2023-03-29 ENCOUNTER — Encounter: Admit: 2023-03-29 | Discharge: 2023-03-30 | Payer: MEDICARE

## 2023-03-29 ENCOUNTER — Ambulatory Visit: Admit: 2023-03-29 | Discharge: 2023-03-30 | Payer: MEDICARE

## 2023-04-04 ENCOUNTER — Ambulatory Visit (INDEPENDENT_AMBULATORY_CARE_PROVIDER_SITE_OTHER): Payer: Medicare HMO | Admitting: Pediatrics

## 2023-04-04 ENCOUNTER — Ambulatory Visit
Admit: 2023-04-04 | Discharge: 2023-04-05 | Disposition: A | Discharge status: Home / Self Care | Payer: MEDICARE | Admitting: Surgery

## 2023-04-04 ENCOUNTER — Encounter
Admit: 2023-04-04 | Discharge: 2023-04-05 | Disposition: A | Discharge status: Home / Self Care | Payer: MEDICARE | Attending: Anesthesiology | Admitting: Surgery

## 2023-04-05 MED ORDER — OXYCODONE 5 MG/5 ML ORAL SOLUTION
Freq: Four times a day (QID) | ORAL | 0 refills | 5 days | Status: CP | PRN
Start: 2023-04-05 — End: 2023-04-10
  Filled 2023-04-05: qty 200, 5d supply, fill #0

## 2023-04-05 MED ORDER — METHOCARBAMOL 500 MG TABLET
ORAL_TABLET | Freq: Three times a day (TID) | ORAL | 0 refills | 10 days | Status: CP | PRN
Start: 2023-04-05 — End: ?
  Filled 2023-04-05: qty 30, 10d supply, fill #0

## 2023-04-11 ENCOUNTER — Institutional Professional Consult (permissible substitution): Admit: 2023-04-11 | Discharge: 2023-04-12 | Payer: MEDICARE | Attending: Surgery | Primary: Surgery

## 2023-04-11 MED FILL — WATER FOR IRRIGATION, STERILE SOLUTION, METHYLCELLULOSE 4000CPS (BULK) 30 % POWDER, SODIUM BICARBONATE (BULK) POWDER, PANTOPRAZOLE 40 MG TABLET,DELAYED RELEASE: ORAL | 12 days supply | Qty: 480 | Fill #10

## 2023-04-21 HISTORY — PX: OTHER SURGICAL HISTORY: SHX169

## 2023-04-21 HISTORY — PX: VOLVULUS REDUCTION: SHX425

## 2023-04-22 ENCOUNTER — Ambulatory Visit: Admit: 2023-04-22 | Discharge: 2023-04-23 | Payer: MEDICARE

## 2023-04-22 DIAGNOSIS — K219 Gastro-esophageal reflux disease without esophagitis: Principal | ICD-10-CM

## 2023-04-22 MED ORDER — PANTOPRAZOLE 2MG/ML SUSPENSION
Freq: Two times a day (BID) | GASTROENTERAL | 11 refills | 30 days | Status: CP
Start: 2023-04-22 — End: 2024-04-21
  Filled 2023-05-11: qty 560, 14d supply, fill #0

## 2023-04-22 MED FILL — WATER FOR IRRIGATION, STERILE SOLUTION, METHYLCELLULOSE 4000CPS (BULK) 30 % POWDER, SODIUM BICARBONATE (BULK) POWDER, PANTOPRAZOLE 40 MG TABLET,DELAYED RELEASE: ORAL | 12 days supply | Qty: 480 | Fill #11

## 2023-04-24 ENCOUNTER — Ambulatory Visit: Admit: 2023-04-24 | Payer: MEDICARE

## 2023-04-24 ENCOUNTER — Ambulatory Visit: Admit: 2023-04-24 | Discharge: 2023-05-11 | Disposition: A | Discharge status: Home / Self Care | Payer: MEDICARE

## 2023-04-24 ENCOUNTER — Encounter: Admit: 2023-04-24 | Payer: MEDICARE | Attending: Anesthesiology

## 2023-04-24 ENCOUNTER — Encounter: Admit: 2023-04-24 | Payer: MEDICARE

## 2023-04-24 ENCOUNTER — Encounter: Admit: 2023-04-24 | Payer: MEDICARE | Attending: Student in an Organized Health Care Education/Training Program

## 2023-05-11 MED ORDER — NALOXEGOL 25 MG TABLET
ORAL_TABLET | Freq: Every day | GASTROSTOMY | 0 refills | 30.00000 days | Status: CP
Start: 2023-05-11 — End: 2023-05-11

## 2023-05-13 ENCOUNTER — Ambulatory Visit: Admit: 2023-05-13 | Discharge: 2023-05-15 | Payer: MEDICARE

## 2023-05-13 ENCOUNTER — Encounter: Admit: 2023-05-13 | Discharge: 2023-05-15 | Payer: MEDICARE

## 2023-05-13 ENCOUNTER — Ambulatory Visit: Admit: 2023-05-13 | Payer: MEDICARE

## 2023-05-14 MED ORDER — INDOMETHACIN 25 MG CAPSULE
ORAL_CAPSULE | Freq: Three times a day (TID) | ORAL | 0 refills | 14.00000 days | Status: CN
Start: 2023-05-14 — End: 2023-05-28

## 2023-05-15 MED ORDER — INDOMETHACIN 25 MG/5 ML ORAL SUSPENSION
Freq: Three times a day (TID) | GASTROENTERAL | 0 refills | 14.00000 days | Status: CN
Start: 2023-05-15 — End: 2023-05-29

## 2023-05-15 MED ORDER — COLCHICINE 0.6 MG TABLET
ORAL_TABLET | Freq: Every day | ORAL | 3 refills | 30.00000 days | Status: CP
Start: 2023-05-15 — End: 2023-09-12
  Filled 2023-05-15: qty 30, 30d supply, fill #0

## 2023-05-15 MED ORDER — INDOMETHACIN 25 MG CAPSULE
ORAL_CAPSULE | Freq: Three times a day (TID) | GASTROSTOMY | 0 refills | 14.00000 days | Status: CP
Start: 2023-05-15 — End: 2023-05-29
  Filled 2023-05-15: qty 42, 14d supply, fill #0

## 2023-05-15 MED FILL — IBUPROFEN 400 MG TABLET: ORAL | 7 days supply | Qty: 21 | Fill #0

## 2023-05-17 ENCOUNTER — Ambulatory Visit: Admit: 2023-05-17 | Discharge: 2023-05-25 | Payer: MEDICARE

## 2023-05-17 ENCOUNTER — Encounter: Admit: 2023-05-17 | Payer: MEDICARE | Attending: Certified Registered"

## 2023-05-17 ENCOUNTER — Ambulatory Visit: Admit: 2023-05-17 | Payer: MEDICARE

## 2023-05-17 ENCOUNTER — Ambulatory Visit: Admit: 2023-05-17 | Discharge: 2023-05-25 | Disposition: A | Discharge status: Home / Self Care | Payer: MEDICARE

## 2023-05-24 MED ORDER — OXYCODONE 5 MG/5 ML ORAL SOLUTION
Freq: Four times a day (QID) | GASTROSTOMY | 0 refills | 4.00000 days | PRN
Start: 2023-05-24 — End: 2023-05-29

## 2023-05-25 MED ORDER — INDOMETHACIN 25 MG CAPSULE
ORAL_CAPSULE | 0 refills | 0.00000 days
Start: 2023-05-25 — End: ?

## 2023-05-25 MED ORDER — OXYCODONE 5 MG/5 ML ORAL SOLUTION
Freq: Four times a day (QID) | GASTROSTOMY | 0 refills | 3.00000 days | Status: CP | PRN
Start: 2023-05-25 — End: 2023-05-28
  Filled 2023-05-25: qty 60, 3d supply, fill #0

## 2023-05-25 MED FILL — WATER FOR IRRIGATION, STERILE SOLUTION, METHYLCELLULOSE 4000CPS (BULK) 30 % POWDER, SODIUM BICARBONATE (BULK) POWDER, PANTOPRAZOLE 40 MG TABLET,DELAYED RELEASE: ORAL | 13 days supply | Qty: 520 | Fill #1

## 2023-05-30 MED ORDER — INDOMETHACIN SUBMICRONIZED 20 MG CAPSULE
ORAL_CAPSULE | Freq: Three times a day (TID) | ORAL | 0 refills | 7.00000 days | Status: CN
Start: 2023-05-30 — End: 2023-06-06

## 2023-05-30 MED ORDER — IBUPROFEN 400 MG TABLET
ORAL_TABLET | Freq: Three times a day (TID) | ORAL | 0 refills | 7.00000 days | Status: CP
Start: 2023-05-30 — End: 2023-06-06

## 2023-05-30 MED ORDER — INDOMETHACIN 25 MG/5 ML ORAL SUSPENSION
Freq: Three times a day (TID) | GASTROENTERAL | 0 refills | 7.00000 days | Status: CN
Start: 2023-05-30 — End: 2023-06-06

## 2023-06-02 ENCOUNTER — Ambulatory Visit: Admit: 2023-06-02 | Discharge: 2023-06-03 | Payer: MEDICARE

## 2023-06-06 ENCOUNTER — Ambulatory Visit: Admit: 2023-06-06 | Discharge: 2023-06-07 | Payer: MEDICARE

## 2023-06-06 ENCOUNTER — Ambulatory Visit: Admit: 2023-06-06 | Discharge: 2023-06-09 | Disposition: A | Discharge status: Home / Self Care | Payer: MEDICARE

## 2023-06-06 DIAGNOSIS — R531 Weakness: Principal | ICD-10-CM

## 2023-06-06 DIAGNOSIS — R000 Tachycardia, unspecified: Principal | ICD-10-CM

## 2023-06-06 DIAGNOSIS — R5383 Other fatigue: Principal | ICD-10-CM

## 2023-06-06 DIAGNOSIS — R509 Fever, unspecified: Principal | ICD-10-CM

## 2023-06-06 DIAGNOSIS — R6889 Other general symptoms and signs: Principal | ICD-10-CM

## 2023-06-08 MED ORDER — ONDANSETRON 4 MG DISINTEGRATING TABLET
ORAL_TABLET | Freq: Three times a day (TID) | 0 refills | 5 days | PRN
Start: 2023-06-08 — End: 2023-06-15

## 2023-06-09 MED ORDER — ONDANSETRON 4 MG DISINTEGRATING TABLET
ORAL_TABLET | Freq: Three times a day (TID) | 0 refills | 5 days | Status: CP | PRN
Start: 2023-06-09 — End: 2023-06-16
  Filled 2023-06-09: qty 15, 5d supply, fill #0

## 2023-06-10 MED FILL — WATER FOR IRRIGATION, STERILE SOLUTION, METHYLCELLULOSE 4000CPS (BULK) 30 % POWDER, SODIUM BICARBONATE (BULK) POWDER, PANTOPRAZOLE 40 MG TABLET,DELAYED RELEASE: ORAL | 14 days supply | Qty: 560 | Fill #0

## 2023-06-13 ENCOUNTER — Telehealth (INDEPENDENT_AMBULATORY_CARE_PROVIDER_SITE_OTHER): Payer: Medicare HMO | Admitting: Pediatrics

## 2023-06-13 ENCOUNTER — Encounter (INDEPENDENT_AMBULATORY_CARE_PROVIDER_SITE_OTHER): Payer: Self-pay | Admitting: Pediatrics

## 2023-06-13 VITALS — Wt 105.0 lb

## 2023-06-13 DIAGNOSIS — G40209 Localization-related (focal) (partial) symptomatic epilepsy and epileptic syndromes with complex partial seizures, not intractable, without status epilepticus: Secondary | ICD-10-CM | POA: Diagnosis not present

## 2023-06-13 DIAGNOSIS — G40309 Generalized idiopathic epilepsy and epileptic syndromes, not intractable, without status epilepticus: Secondary | ICD-10-CM

## 2023-06-13 DIAGNOSIS — R198 Other specified symptoms and signs involving the digestive system and abdomen: Secondary | ICD-10-CM

## 2023-06-13 DIAGNOSIS — F5105 Insomnia due to other mental disorder: Secondary | ICD-10-CM

## 2023-06-13 DIAGNOSIS — F99 Mental disorder, not otherwise specified: Secondary | ICD-10-CM

## 2023-06-13 MED ORDER — CLONAZEPAM 0.5 MG PO TBDP
ORAL_TABLET | ORAL | Status: DC
Start: 2023-06-13 — End: 2023-07-22

## 2023-06-13 MED ORDER — FELBAMATE 600 MG/5ML PO SUSP
ORAL | 5 refills | Status: DC
Start: 2023-06-13 — End: 2023-10-06

## 2023-06-13 NOTE — Patient Instructions (Signed)
I recommend asking Jonathan Kirby's GI doctors about seeing Genetics and if they have a geneticist they would like him to see at [facility].  Continue gabapentin.  Feel free to use Benadryl to help [facility] sleep. You can try different doses between 2.5 mL and 5 mL to optimize sleep.  Continue all other medications

## 2023-06-14 ENCOUNTER — Ambulatory Visit: Admit: 2023-06-14 | Discharge: 2023-06-15 | Payer: MEDICARE

## 2023-06-14 DIAGNOSIS — D508 Other iron deficiency anemias: Principal | ICD-10-CM

## 2023-06-14 DIAGNOSIS — R748 Abnormal levels of other serum enzymes: Principal | ICD-10-CM

## 2023-06-14 DIAGNOSIS — E43 Unspecified severe protein-calorie malnutrition: Principal | ICD-10-CM

## 2023-06-20 ENCOUNTER — Ambulatory Visit: Admit: 2023-06-20 | Discharge: 2023-06-20 | Payer: MEDICARE

## 2023-06-20 ENCOUNTER — Telehealth: Admit: 2023-06-20 | Discharge: 2023-06-20 | Payer: MEDICARE

## 2023-06-20 DIAGNOSIS — E43 Unspecified severe protein-calorie malnutrition: Principal | ICD-10-CM

## 2023-06-20 DIAGNOSIS — I300 Acute nonspecific idiopathic pericarditis: Principal | ICD-10-CM

## 2023-06-21 DIAGNOSIS — D643 Other sideroblastic anemias: Principal | ICD-10-CM

## 2023-06-23 MED FILL — WATER FOR IRRIGATION, STERILE SOLUTION, METHYLCELLULOSE 4000CPS (BULK) 30 % POWDER, SODIUM BICARBONATE (BULK) POWDER, PANTOPRAZOLE 40 MG TABLET,DELAYED RELEASE: ORAL | 14 days supply | Qty: 560 | Fill #1

## 2023-07-01 ENCOUNTER — Ambulatory Visit: Admit: 2023-07-01 | Discharge: 2023-07-02 | Payer: MEDICARE

## 2023-07-01 DIAGNOSIS — K56609 Unspecified intestinal obstruction, unspecified as to partial versus complete obstruction: Principal | ICD-10-CM

## 2023-07-05 MED FILL — WATER FOR IRRIGATION, STERILE SOLUTION, METHYLCELLULOSE 4000CPS (BULK) 30 % POWDER, SODIUM BICARBONATE (BULK) POWDER, PANTOPRAZOLE 40 MG TABLET,DELAYED RELEASE: ORAL | 12 days supply | Qty: 480 | Fill #0

## 2023-07-06 ENCOUNTER — Telehealth (INDEPENDENT_AMBULATORY_CARE_PROVIDER_SITE_OTHER): Payer: Self-pay | Admitting: Pediatrics

## 2023-07-06 NOTE — Telephone Encounter (Signed)
Name of who is calling: darlene  Caller's Relationship to Patient: mom  Best contact number: [phone]  Provider they see: Artis Flock  Reason for call: called to scheduled his f/u w/ Dr. Artis Flock, said he needed on in 3 months, please contact back      PRESCRIPTION REFILL ONLY  Name of prescription:  Pharmacy:

## 2023-07-07 ENCOUNTER — Ambulatory Visit: Payer: Self-pay

## 2023-07-07 ENCOUNTER — Ambulatory Visit: Payer: Medicare Other

## 2023-07-07 DIAGNOSIS — K56609 Unspecified intestinal obstruction, unspecified as to partial versus complete obstruction: Principal | ICD-10-CM

## 2023-07-10 ENCOUNTER — Encounter (INDEPENDENT_AMBULATORY_CARE_PROVIDER_SITE_OTHER): Payer: Self-pay | Admitting: Pediatrics

## 2023-07-11 ENCOUNTER — Ambulatory Visit: Payer: Medicare HMO

## 2023-07-11 DIAGNOSIS — Z23 Encounter for immunization: Secondary | ICD-10-CM

## 2023-07-11 DIAGNOSIS — Z719 Counseling, unspecified: Secondary | ICD-10-CM

## 2023-07-11 NOTE — Progress Notes (Signed)
[facility] to administer requested vaccine..  Pt's mother had asked that pt receive flu vaccine and covid vaccine.  Reports he had covid infection in September and I informed her CDC recommends vaccination 90 days after infection.  She checked with his doctor and dr Dorette Grate flu vaccine but to wait on covid vaccine for now.  See vaccine administration record/screening signed by parent (sent for scanning).  VIS given.  Flu vaccine administered by Marin Roberts RN; tolerated well.  After vaccine care reviewed.  Copy of updated [facility] provided.  Cherlynn Polo, RN

## 2023-07-13 ENCOUNTER — Ambulatory Visit: Admit: 2023-07-13 | Discharge: 2023-07-16 | Disposition: A | Discharge status: Home / Self Care | Payer: MEDICARE

## 2023-07-18 MED FILL — WATER FOR IRRIGATION, STERILE SOLUTION, METHYLCELLULOSE 4000CPS (BULK) 30 % POWDER, SODIUM BICARBONATE (BULK) POWDER, PANTOPRAZOLE 40 MG TABLET,DELAYED RELEASE: ORAL | 12 days supply | Qty: 480 | Fill #1

## 2023-07-21 DIAGNOSIS — K5909 Other constipation: Principal | ICD-10-CM

## 2023-07-21 MED ORDER — MOTEGRITY 1 MG TABLET
ORAL_TABLET | Freq: Every day | ORAL | 11 refills | 0 days
Start: 2023-07-21 — End: ?

## 2023-07-21 MED ORDER — PRUCALOPRIDE 1 MG TABLET
ORAL_TABLET | Freq: Every day | ORAL | 11 refills | 0 days | Status: CP
Start: 2023-07-21 — End: 2024-07-20

## 2023-07-22 ENCOUNTER — Other Ambulatory Visit (INDEPENDENT_AMBULATORY_CARE_PROVIDER_SITE_OTHER): Payer: Self-pay | Admitting: Family

## 2023-07-22 ENCOUNTER — Ambulatory Visit: Admit: 2023-07-22 | Discharge: 2023-07-22 | Disposition: A | Discharge status: Home Health | Payer: MEDICARE

## 2023-07-22 DIAGNOSIS — G40209 Localization-related (focal) (partial) symptomatic epilepsy and epileptic syndromes with complex partial seizures, not intractable, without status epilepticus: Secondary | ICD-10-CM

## 2023-07-25 DIAGNOSIS — K5904 Chronic idiopathic constipation: Principal | ICD-10-CM

## 2023-07-27 ENCOUNTER — Ambulatory Visit: Admit: 2023-07-27 | Discharge: 2023-07-28 | Payer: MEDICARE

## 2023-07-27 DIAGNOSIS — D508 Other iron deficiency anemias: Principal | ICD-10-CM

## 2023-08-01 ENCOUNTER — Other Ambulatory Visit: Payer: Self-pay | Admitting: Medical Genetics

## 2023-08-01 DIAGNOSIS — Z006 Encounter for examination for normal comparison and control in clinical research program: Secondary | ICD-10-CM

## 2023-08-01 MED FILL — WATER FOR IRRIGATION, STERILE SOLUTION, METHYLCELLULOSE 4000CPS (BULK) 30 % POWDER, SODIUM BICARBONATE (BULK) POWDER, PANTOPRAZOLE 40 MG TABLET,DELAYED RELEASE: ORAL | 14 days supply | Qty: 560 | Fill #0

## 2023-08-03 ENCOUNTER — Ambulatory Visit: Admit: 2023-08-03 | Discharge: 2023-08-04 | Payer: MEDICARE

## 2023-08-08 ENCOUNTER — Ambulatory Visit: Admit: 2023-08-08 | Discharge: 2023-08-09 | Payer: MEDICARE

## 2023-08-08 DIAGNOSIS — E43 Unspecified severe protein-calorie malnutrition: Principal | ICD-10-CM

## 2023-08-08 DIAGNOSIS — T50905A Adverse effect of unspecified drugs, medicaments and biological substances, initial encounter: Principal | ICD-10-CM

## 2023-08-08 DIAGNOSIS — K769 Liver disease, unspecified: Principal | ICD-10-CM

## 2023-08-08 DIAGNOSIS — E8889 Other specified metabolic disorders: Principal | ICD-10-CM

## 2023-08-08 DIAGNOSIS — G830 Diplegia of upper limbs: Principal | ICD-10-CM

## 2023-08-08 MED ORDER — DICYCLOMINE 10 MG/5 ML ORAL SOLUTION
Freq: Four times a day (QID) | GASTROSTOMY | 12 refills | 12 days | Status: CP | PRN
Start: 2023-08-08 — End: 2024-08-07

## 2023-08-10 ENCOUNTER — Ambulatory Visit: Admit: 2023-08-10 | Discharge: 2023-08-11 | Payer: MEDICARE

## 2023-08-16 MED FILL — WATER FOR IRRIGATION, STERILE SOLUTION, METHYLCELLULOSE 4000CPS (BULK) 30 % POWDER, SODIUM BICARBONATE (BULK) POWDER, PANTOPRAZOLE 40 MG TABLET,DELAYED RELEASE: ORAL | 12 days supply | Qty: 480 | Fill #0

## 2023-08-17 ENCOUNTER — Ambulatory Visit: Admit: 2023-08-17 | Discharge: 2023-08-18 | Payer: MEDICARE

## 2023-08-24 ENCOUNTER — Ambulatory Visit: Admit: 2023-08-24 | Discharge: 2023-08-25 | Payer: MEDICARE

## 2023-08-25 MED FILL — WATER FOR IRRIGATION, STERILE SOLUTION, METHYLCELLULOSE 4000CPS (BULK) 30 % POWDER, SODIUM BICARBONATE (BULK) POWDER, PANTOPRAZOLE 40 MG TABLET,DELAYED RELEASE: ORAL | 10 days supply | Qty: 400 | Fill #1

## 2023-08-30 ENCOUNTER — Ambulatory Visit: Admit: 2023-08-30 | Discharge: 2023-08-31 | Payer: MEDICARE

## 2023-09-01 ENCOUNTER — Telehealth: Admit: 2023-09-01 | Discharge: 2023-09-02 | Payer: MEDICARE | Attending: Registered" | Primary: Registered"

## 2023-09-05 ENCOUNTER — Ambulatory Visit (LOCAL_COMMUNITY_HEALTH_CENTER): Payer: Medicare HMO

## 2023-09-05 DIAGNOSIS — Z23 Encounter for immunization: Secondary | ICD-10-CM | POA: Diagnosis not present

## 2023-09-05 DIAGNOSIS — Z719 Counseling, unspecified: Secondary | ICD-10-CM

## 2023-09-05 MED FILL — WATER FOR IRRIGATION, STERILE SOLUTION, METHYLCELLULOSE 4000CPS (BULK) 30 % POWDER, SODIUM BICARBONATE (BULK) POWDER, PANTOPRAZOLE 40 MG TABLET,DELAYED RELEASE: ORAL | 12 days supply | Qty: 480 | Fill #2

## 2023-09-05 NOTE — Progress Notes (Signed)
Homevisit to administer Covid vaccine as requested by pt's mother.  Pt had covid infection in September 2024.   See vaccine administration/ screening form (sent for scanning).  VIS given.   Spikevax 12+ 2024-25 formula administered IM by Marin Roberts RN; tolerated well by pt.  Updated NCIR given to parent and reviewed post vaccination care.  Cherlynn Polo, RN

## 2023-09-06 ENCOUNTER — Ambulatory Visit: Admit: 2023-09-06 | Discharge: 2023-09-07 | Payer: MEDICARE

## 2023-09-06 DIAGNOSIS — R6339 Feeding intolerance: Principal | ICD-10-CM

## 2023-09-06 DIAGNOSIS — K562 Volvulus: Principal | ICD-10-CM

## 2023-09-08 ENCOUNTER — Institutional Professional Consult (permissible substitution): Admit: 2023-09-08 | Discharge: 2023-09-09 | Payer: MEDICARE | Attending: Acute Care | Primary: Acute Care

## 2023-09-08 DIAGNOSIS — Z452 Encounter for adjustment and management of vascular access device: Principal | ICD-10-CM

## 2023-09-19 ENCOUNTER — Inpatient Hospital Stay: Admit: 2023-09-19 | Discharge: 2023-09-23 | Disposition: A | Discharge status: Home / Self Care | Payer: MEDICARE

## 2023-09-19 ENCOUNTER — Ambulatory Visit: Admit: 2023-09-19 | Discharge: 2023-09-23 | Payer: MEDICARE

## 2023-09-19 ENCOUNTER — Ambulatory Visit: Admit: 2023-09-19 | Discharge: 2023-09-23 | Disposition: A | Discharge status: Home / Self Care | Payer: MEDICARE

## 2023-09-19 MED FILL — WATER FOR IRRIGATION, STERILE SOLUTION, METHYLCELLULOSE 4000CPS (BULK) 30 % POWDER, SODIUM BICARBONATE (BULK) POWDER, PANTOPRAZOLE 40 MG TABLET,DELAYED RELEASE: ORAL | 12 days supply | Qty: 480 | Fill #3

## 2023-09-20 DIAGNOSIS — R6339 Feeding intolerance: Principal | ICD-10-CM

## 2023-09-20 DIAGNOSIS — K562 Volvulus: Principal | ICD-10-CM

## 2023-09-28 NOTE — Progress Notes (Signed)
Patient: Jonathan Kirby MRN: [id number] Sex: male DOB: 05-25-1986  Provider: Lorenz Coaster, MD Location of Care: Pediatric Specialist- Pediatric Complex Care Note type: Routine return visit  History of Present Illness: Referral Source: [name], MD History from: patient and prior records Chief Complaint: complex care  Jonathan Kirby is a 38 y.o. male with history of autism and focal epilepsy who I am seeing in follow-up for complex care management. Patient was last seen 06/13/2023 where I continued Felbatol, gabapentin, Klonopin, Valtoco, advised using benadryl for sleep, and discussed genetics referral.  Since that appointment, patient has been hospitalized for nausea and decreased ileostomy output on 07/13/2023 and for abdominal pain on 09/19/2023. He was discharged on 09/23/2023 with home health services.    Patient presents today with mother who reports the following:   Symptom management:  Seizures overall doing well.  At the last hospitalization he has 2 short seizures, but there hasn't been any.  He hasn't had any difficulty getting medications down.   Sleep is definitely than last appointment.  Benedryl didn't work for sleep.  Mom has found that the only thing that works is Klonopin, gabapentin, and tylenol so she has been having to use it frequently. The combination works very well but if she takes away one of them, he doesn't sleep.    Mother is concerned that he wants to hold onto mom throughout the day. Mother concerned this could be gabapentin, but goes on throughout the day.    GI-wise, he was "the best we have ever seen him" after October hospitalization.  Tried motegrity and things started to go downhill. Mom amits though it may not have been related to the medication.  She stopped after 4 motegrity. Since then, he is having a lot of abdominal pain especially at night and first thing in the morning.  THey ar einterested in seeing a GI motility specialist at this point,  don't feel there is anything structural.  They are concerned about LFTs, trying to limit TPN. Was giving tylenol TID, now limiting.   Talked to PCP about geneticist.  Wanted to also ask about a Scientist, research (physical sciences).  They have entered a clinical trial.    Care coordination (other providers): Patient saw Dr. Domingo Madeira with [facility] on 06/20/2023 where he recommended follow up if pericarditis returned.   Patient saw Dr. Carmon Sails with [facility] on 07/01/2023 where she continued TPN and recommended Motegrity. Patient also saw Durenda Guthrie, RD on 09/01/2023 who continued TPN with a trial of 1 carton Peptamen 1.5 via PEG.  Patient started receiving Iron infusions on 07/27/2023.  He completed this treatment.  Following up with PCP to reassess.    Care management needs:   Equipment needs:  No new equipment needs.  They are waiting for test with [facility].    Diagnostics/Patient history:  He had a mixture of complex partial seizures with secondary generalization, and myoclonic seizures.  These were quite frequent, and caused significant impairment in his function.  Once we were able to bring seizures under control Felbatol, he showed a great deal of anxiety and depression.  This has improved as he has become older.  For the most part his seizures have been well controlled on Felbatol.  Most recurrent seizures have occurred in the setting of forgotten doses of medication.  He also has problems with constipation.    EEG 08/28/20 Impression:  This study is consistent with patient's known history of epilepsy, which could be fragments of generalized epilepsy  or multifocal epilepsy.  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  06/26/2021 GD with FLIP: findings suggestive of Type 1 achalasia. 07/20/2021 CT of Abd, Pelvis & Chest:  no intraabdominal pathology.  RLQ ileostomy, no obstruction, numerous decompressed small bowel loops with trace  of mesenteric edema and engorged vasa recta which can occur with inflammatory/infectious enteritis 07/23/2021 EGD with botox injection An area at the lower esophageal sphincter was injected with botox, 1 cm hiatal hernia was present, cardia and gastric fundus were normal on retroflexion, stomach was normal, examined duodenum was normal. No acute abnormality of the airways, lungs, pleura, or mediastinum. *Mild thickening of the distal esophagus is nonspecific but could be related to gastroesophageal reflux. 10/15/2021 CT of Abd: Mild thickening of the distal esophagus which is nonspecific, however can be seen in the setting of esophagitis. 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 10/16/2021 EGD with Corpak placement: no evidence of GI bleeding to explain melena, h/h has remained stable Gastric oxyntic mucosa with mild chronic inactive gastritis. Negative for Helicobacter organisms on H&E stain. 10/20/2021 U/S Liver Doppler: patent hepatic vasculature with normal flow direction. Cholelithiasis. 10/23/2021 Liver Biopsy and PEG tube placement   Past Medical History Past Medical History:  Diagnosis Date   Autism    GERD (gastroesophageal reflux disease)    Seizures [facility])     Surgical History Past Surgical History:  Procedure Laterality Date   bowel restrucion  04/2023   CIRCUMCISION  1987   ESOPHAGOGASTRODUODENOSCOPY N/A 04/17/2020   Procedure: ESOPHAGOGASTRODUODENOSCOPY (EGD);  Surgeon: Pasty Spillers, MD;  Location: [facility] ENDOSCOPY;  Service: Endoscopy;  Laterality: N/A;   ESOPHAGOGASTRODUODENOSCOPY (EGD) WITH PROPOFOL N/A 08/28/2020   Procedure: ESOPHAGOGASTRODUODENOSCOPY (EGD) WITH PROPOFOL;  Surgeon: Toney Reil, MD;  Location: [facility] ENDOSCOPY;  Service: Gastroenterology;  Laterality: N/A;   STOMA REVISION  08/2022   VOLVULUS REDUCTION  04/2023     Family History family history includes Alzheimer's disease in his maternal grandfather; Cancer in his maternal grandmother; Stroke in his paternal grandfather.   Social History Social History   Social History Narrative   Jonathan Kirby does not attend any school or day program at this time.    He lives with his parents.    He enjoys listening to music, playing with remote control cars, and collecting newspapers.    Allergies Allergies  Allergen Reactions   Lactose Intolerance (Gi)     Upset stomach, excessive gas   Vimpat [Lacosamide]     Elevated liver enzymes after starting TPN & Vimpat    Medications Current Outpatient Medications on File Prior to Visit  Medication Sig Dispense Refill   pantoprazole sodium (PROTONIX) 40 mg Take 40 mg by mouth daily.     simethicone (MYLICON) 40 MG/0.6ML drops Take 40 mg by mouth every 6 (six) hours as needed.     diazePAM, 20 MG Dose, (VALTOCO 20 MG DOSE) 2 x 10 MG/0.1ML LQPK PLACE 20 MG INTO THE NOSE AS NEEDED SEIZUR LONGER THEN 2-3 MIN. 2 each 1   melatonin 1 MG TABS tablet Take 3 tablets (3 mg total) by mouth at bedtime as needed. 90 tablet 5   No current facility-administered medications on file prior to visit.   The medication list was reviewed and reconciled. All changes or newly prescribed medications were explained.  A complete medication list was provided to  the patient/caregiver.  Physical Exam Wt 114 lb 11.2 oz (52 kg)   BMI 19.09 kg/m  Weight for age: Facility age limit for growth %iles is 20 years.  Length for age: Facility age limit for growth %iles is 20 years. BMI: Body mass index is 19.09 kg/m. No results found. Gen: well appearing neuroaffected young man Skin: No rash, No neurocutaneous stigmata. HEENT: Normocephalic, no dysmorphic features, no conjunctival injection, nares patent, mucous membranes moist, oropharynx clear.  Neck: Supple, no meningismus. No focal tenderness. Resp: Clear to auscultation bilaterally CV:  Regular rate, normal S1/S2, no murmurs, no rubs Abd: BS present, abdomen soft, non-tender, non-distended. No hepatosplenomegaly or mass.  Ext: Warm and well-perfused. No deformities, no muscle wasting, ROM full.   Neurological Examination: MS: Awake, alert.  Nonverbal, withdraws from engagement and grunts.   Cranial Nerves: Pupils were equal and reactive to light;  No clear visual field defect, no nystagmus; no ptsosis, face symmetric with full strength of facial muscles, hearing grossly intact, palate elevation is symmetric. Motor-Fairly normal tone throughout, moves extremities at least antigravity. No abnormal movements Reflexes- Reflexes 2+ and symmetric in the biceps, triceps, patellar and achilles tendon. Plantar responses flexor bilaterally, no clonus noted Sensation: Responds to touch in all extremities.  Coordination: Does not reach for objects.  Gait: sitting in wheelchair, gait deferred.      Diagnosis:  1. Autism spectrum disorder with accompanying language impairment and intellectual disability, requiring very substantial support   2. Partial epilepsy with impairment of consciousness (HCC)   3. Generalized convulsive epilepsy (HCC)   4. Ileus (HCC)      Assessment and Plan Jonathan Kirby is a 38 y.o. male with history of autism and focal epilepsy who presents for follow-up in the pediatric complex care clinic. Symptom management:  Continue Felbatol for seizures.  Increase gabapentin to 2 mL in the morning, 2 mL in the afternoon, 6 mL at night for visceral pain and to help sleep.  If needed, you can increase Klonopin to 0.5mg  (full tablet) at night for sleep.  Discussed that this is not ideal long-term, but reasonable for now.  Recommend discussing with [facility] doctor about any concerns with daily Tylenol use  Care coordination: Referred to genetics at [facility]. I am sending it to Dr Blake Divine  [facility].  This is the same physician listed in the  study order.    Care management needs:  Consider referrals to [facility] at next appointment for home-based primary care, possible home health.    Equipment needs:  Due to patient's medical condition, patient is indefinitely incontinent of stool and urine.  It is medically necessary for them to use diapers, underpads, and gloves to assist with hygiene and skin integrity.  They require a frequency of up to 200 a month.  The CARE PLAN for reviewed and revised to represent the changes above.  This is available in Epic under snapshot, and a physical binder provided to the patient, that can be used for anyone providing care for the patient.    I spend 45 minutes on day of service on this patient including review of chart, discussion with patient and family, coordination with other providers and management of orders and paperwork.    Return in about 3 months (around 01/04/2024).  Lorenz Coaster MD MPH Neurology,  Neurodevelopment and Neuropalliative care [facility] Child Neurology  [address] [place], [place], [place] [address] Phone: [phone]

## 2023-10-03 MED FILL — WATER FOR IRRIGATION, STERILE SOLUTION, METHYLCELLULOSE 4000CPS (BULK) 30 % POWDER, SODIUM BICARBONATE (BULK) POWDER, PANTOPRAZOLE 40 MG TABLET,DELAYED RELEASE: ORAL | 12 days supply | Qty: 480 | Fill #4

## 2023-10-06 ENCOUNTER — Encounter (INDEPENDENT_AMBULATORY_CARE_PROVIDER_SITE_OTHER): Payer: Self-pay | Admitting: Pediatrics

## 2023-10-06 ENCOUNTER — Ambulatory Visit (INDEPENDENT_AMBULATORY_CARE_PROVIDER_SITE_OTHER): Payer: Medicaid Other | Admitting: Pediatrics

## 2023-10-06 VITALS — Wt 114.7 lb

## 2023-10-06 DIAGNOSIS — G40209 Localization-related (focal) (partial) symptomatic epilepsy and epileptic syndromes with complex partial seizures, not intractable, without status epilepticus: Secondary | ICD-10-CM | POA: Diagnosis not present

## 2023-10-06 DIAGNOSIS — F840 Autistic disorder: Secondary | ICD-10-CM

## 2023-10-06 DIAGNOSIS — F79 Unspecified intellectual disabilities: Secondary | ICD-10-CM

## 2023-10-06 DIAGNOSIS — K567 Ileus, unspecified: Secondary | ICD-10-CM

## 2023-10-06 DIAGNOSIS — G40309 Generalized idiopathic epilepsy and epileptic syndromes, not intractable, without status epilepticus: Secondary | ICD-10-CM | POA: Diagnosis not present

## 2023-10-06 MED ORDER — CLONAZEPAM 0.5 MG PO TBDP: ORAL_TABLET | ORAL | 3 refills | Status: DC

## 2023-10-06 MED ORDER — FELBAMATE 600 MG/5ML PO SUSP: ORAL | 5 refills | Status: DC

## 2023-10-06 MED ORDER — GABAPENTIN 250 MG/5ML PO SOLN: ORAL | 12 refills | Status: DC

## 2023-10-06 NOTE — Patient Instructions (Addendum)
Symptom management: Increase gabapentin to 2 mL in the morning, 2 mL in the afternoon, 6 mL at night Continue all other medications If needed, you can increase Klonopin to 1 tablet at night Talk to [facility] doctor about any concerns daily Tylenol use Care Coordination: Referred to genetics at [facility]. I am sending it to [facility] [address] [place], [place] [address] [phone]

## 2023-10-07 ENCOUNTER — Ambulatory Visit: Admit: 2023-10-07 | Discharge: 2023-10-08 | Payer: MEDICARE

## 2023-10-11 ENCOUNTER — Encounter: Admit: 2023-10-11 | Discharge: 2023-10-12 | Payer: MEDICARE

## 2023-10-11 DIAGNOSIS — R790 Abnormal level of blood mineral: Principal | ICD-10-CM

## 2023-10-11 DIAGNOSIS — G4700 Insomnia, unspecified: Principal | ICD-10-CM

## 2023-10-11 DIAGNOSIS — E8889 Other specified metabolic disorders: Principal | ICD-10-CM

## 2023-10-11 DIAGNOSIS — G830 Diplegia of upper limbs: Principal | ICD-10-CM

## 2023-10-11 DIAGNOSIS — R7401 Transaminitis: Principal | ICD-10-CM

## 2023-10-11 DIAGNOSIS — L299 Pruritus, unspecified: Principal | ICD-10-CM

## 2023-10-11 DIAGNOSIS — Z95828 Presence of other vascular implants and grafts: Principal | ICD-10-CM

## 2023-10-11 DIAGNOSIS — E43 Unspecified severe protein-calorie malnutrition: Principal | ICD-10-CM

## 2023-10-11 DIAGNOSIS — Z931 Gastrostomy status: Principal | ICD-10-CM

## 2023-10-11 DIAGNOSIS — D649 Anemia, unspecified: Principal | ICD-10-CM

## 2023-10-11 DIAGNOSIS — Z932 Ileostomy status: Principal | ICD-10-CM

## 2023-10-11 DIAGNOSIS — G40909 Epilepsy, unspecified, not intractable, without status epilepticus: Principal | ICD-10-CM

## 2023-10-11 DIAGNOSIS — Z0000 Encounter for general adult medical examination without abnormal findings: Principal | ICD-10-CM

## 2023-10-11 MED ORDER — FAMOTIDINE 40 MG/5 ML (8 MG/ML) ORAL SUSPENSION
Freq: Two times a day (BID) | ORAL | 3 refills | 30.00 days | Status: CP
Start: 2023-10-11 — End: 2023-11-10

## 2023-10-13 MED FILL — WATER FOR IRRIGATION, STERILE SOLUTION, METHYLCELLULOSE 4000CPS (BULK) 30 % POWDER, SODIUM BICARBONATE (BULK) POWDER, PANTOPRAZOLE 40 MG TABLET,DELAYED RELEASE: ORAL | 12 days supply | Qty: 480 | Fill #5

## 2023-10-19 ENCOUNTER — Ambulatory Visit: Admit: 2023-10-19 | Discharge: 2023-10-20 | Payer: MEDICARE

## 2023-10-19 DIAGNOSIS — Z0000 Encounter for general adult medical examination without abnormal findings: Principal | ICD-10-CM

## 2023-10-23 ENCOUNTER — Encounter (INDEPENDENT_AMBULATORY_CARE_PROVIDER_SITE_OTHER): Payer: Self-pay | Admitting: Pediatrics

## 2023-10-25 DIAGNOSIS — K219 Gastro-esophageal reflux disease without esophagitis: Principal | ICD-10-CM

## 2023-10-25 MED FILL — WATER FOR IRRIGATION, STERILE SOLUTION, METHYLCELLULOSE 4000CPS (BULK) 30 % POWDER, SODIUM BICARBONATE (BULK) POWDER, PANTOPRAZOLE 40 MG TABLET,DELAYED RELEASE: ORAL | 6 days supply | Qty: 240 | Fill #6

## 2023-11-07 MED FILL — WATER FOR IRRIGATION, STERILE SOLUTION, METHYLCELLULOSE 4000CPS (BULK) 30 % POWDER, SODIUM BICARBONATE (BULK) POWDER, PANTOPRAZOLE 40 MG TABLET,DELAYED RELEASE: ORAL | 12 days supply | Qty: 480 | Fill #7

## 2023-11-14 ENCOUNTER — Encounter: Admit: 2023-11-14 | Discharge: 2023-11-15 | Payer: MEDICARE | Attending: Registered" | Primary: Registered"

## 2023-11-17 DIAGNOSIS — L299 Pruritus, unspecified: Principal | ICD-10-CM

## 2023-11-17 MED ORDER — FAMOTIDINE 40 MG/5 ML (8 MG/ML) ORAL SUSPENSION
Freq: Two times a day (BID) | ORAL | 3 refills | 30.00 days | Status: CP
Start: 2023-11-17 — End: 2023-12-17

## 2023-11-21 DIAGNOSIS — K219 Gastro-esophageal reflux disease without esophagitis: Principal | ICD-10-CM

## 2023-11-21 MED ORDER — PANTOPRAZOLE 2MG/ML SUSPENSION
Freq: Two times a day (BID) | ORAL | 3 refills | 88.00 days | Status: CP
Start: 2023-11-21 — End: 2024-11-05
  Filled 2023-11-22: qty 480, 12d supply, fill #0

## 2023-12-01 ENCOUNTER — Ambulatory Visit (INDEPENDENT_AMBULATORY_CARE_PROVIDER_SITE_OTHER): Payer: Medicare Other | Admitting: Medical Genetics

## 2023-12-01 VITALS — BP 107/64 | HR 73 | Ht 65.0 in | Wt 108.0 lb

## 2023-12-01 DIAGNOSIS — K567 Ileus, unspecified: Secondary | ICD-10-CM

## 2023-12-01 DIAGNOSIS — K5989 Other specified functional intestinal disorders: Secondary | ICD-10-CM

## 2023-12-01 DIAGNOSIS — G40309 Generalized idiopathic epilepsy and epileptic syndromes, not intractable, without status epilepticus: Secondary | ICD-10-CM

## 2023-12-01 DIAGNOSIS — F71 Moderate intellectual disabilities: Secondary | ICD-10-CM | POA: Diagnosis not present

## 2023-12-01 DIAGNOSIS — F840 Autistic disorder: Secondary | ICD-10-CM

## 2023-12-01 NOTE — Progress Notes (Signed)
 MEDICAL GENETICS NEW PATIENT EVALUATION  Patient name: Jonathan Kirby DOB: 1986-06-12 Age: 38 y.o. MRN: [id number]  Referring Provider/Specialty:  Lorenz Coaster, MD Date of Evaluation: 12/01/2023 Chief Complaint/Reason for Referral: Intestinal pseudo obstruction, autism spectrum disorder  Assessment: We discussed with Jonathan Kirby's family that there could be a genetic cause to his various medical and developmental symptoms. We also discussed that genetic testing may be able to determine if a genetic cause is present for which there may be available treatment, but that it may also be where his results do not provide for any management changes. Appropriate testing at this time would include exome sequencing; this would simultaneously evaluate thousands of individual genes for smaller changes, as well as the chromosomes for gains or losses of genetic material. Jonathan Kirby's parents are considering doing this testing. In preparation for them to go through with testing, consent and samples were provided (for them to do at home) for a trio exome sequencing study through [facility]. If performed, the results are expected in 1-2 months once the samples are received by the lab. We will contact his family when they are available. Jonathan Kirby should otherwise continue his medical care per his current provider's recommendations.  Recommendations: Parents to consider a trio exome sequencing study through [facility] - results expected in 1-2 months once samples are received by the lab. Continue follow up with current medical providers per their recommendations.  Follow up will be based on the results of the testing.   HPI: Jonathan Kirby is a 38 y.o. assigned male at birth who presents today for an initial genetics evaluation for intestinal pseudoobstruction and autism spectrum disorder. He is accompanied by his parents, who provided the history. This information, along with a review of pertinent records, labs, and radiology  studies, is summarized below.  At around 6 mo of age (following a fever from his 6 month vaccines), Jonathan Kirby was not showing any signs of his communication. He also had difficulty being around strangers and having sensitivities to certain noises. He was able to sing words before he could say them. He was referred to developmental pediatrics at [facility] around age 53 and eventually diagnosed with autism. He has echolalia but generally does not say many words. He still sings songs. He can communicate with his parents well, and seems to understand what is said to him and follow commands. He can walk well.  Jonathan Kirby developed intestinal pseudoobstruction in 2021, but prior to that he had chronic constipation managed with enemas. He was tried on some IBS meds with no improvement. He had a colectomy and ileostomy procedure in 2022 at [facility]. Prior to the surgery he had episodes with eating that would resolve. As it worsened he would develop abdominal pain and bloating. He has issues with swallowing and achalasia.  Pregnancy/Birth History: Jonathan Kirby was born to a then 38 year old G1 P0->1 mother. The pregnancy was conceived naturally and was complicated by . There were no exposures and labs were normal. Ultrasounds were normal. Amniotic fluid levels were normal. Fetal activity was normal.  Jonathan Kirby was born at full term gestation at [facility] via c-section delivery. There were no complications with the delivery. Birth weight 7 lbs 9 oz,. He did not require a NICU stay. He was discharged home 3 days after birth. He passed the newborn screen and hearing test.  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   Autism spectrum disorder with accompanying language impairment and intellectual disability, requiring very substantial support 07/23/2014   Partial epilepsy with  impairment of consciousness (HCC) 04/02/2013   Moderate intellectual disabilities 04/02/2013   Developmental History: Milestones -- see HPI, walked late Therapies -- several in the past but none currently School -- went to [employer], then public school in an autistic class, he stopped school in 11th grade, he is not in any day program  Medications: Current Outpatient Medications on File Prior to Visit  Medication Sig Dispense Refill   clonazePAM (KLONOPIN) 0.5 MG disintegrating tablet TAKE 1/2 TABLET DISSOLVE IN WATER AND EVERY 8 HOURS AS NEEDED FOR SEIZURES or SLEEP 45 tablet 3   diazePAM, 20 MG Dose, (VALTOCO 20 MG DOSE) 2 x 10 MG/0.1ML LQPK PLACE 20 MG INTO THE NOSE AS NEEDED SEIZUR LONGER THEN 2-3 MIN. 2 each 1   felbamate (FELBATOL) 600 MG/5ML suspension TAKE BY MOUTH TWICE DAILY 600 mL 5   gabapentin (NEURONTIN) 250 MG/5ML solution Take 2ml in morning and afternoon, and 6ml at night. 300 mL 12   pantoprazole sodium (PROTONIX) 40 mg Take 40 mg by mouth daily.     simethicone (MYLICON) 40 MG/0.6ML drops Take 40 mg by mouth every 6 (six) hours as needed.     No current facility-administered medications on file prior to visit.   Allergies:  Allergies  Allergen Reactions   Lactose Intolerance (Gi)     Upset stomach, excessive gas   Vimpat [Lacosamide]     Elevated liver enzymes after starting TPN & Vimpat   Review of Systems: Negative except as noted in the HPI  Family History: Family History  Problem Relation Age of Onset   Cancer Maternal Grandmother        Died at 29   Alzheimer's disease Maternal Grandfather        Died at 45   Stroke Paternal Grandfather        Died at 72  Self-reported ancestry: African-American Consanguinity: Denies Please see the Dentist note for additional information  Social History: Lives with his parents in [place]  Vitals: Head circumference: 57.2 cm  Genetics Physical Exam:  Constitution: The  patient is active and alert  Head: No abnormalities detected in: head, hairline, shape or size    Anterior fontanelle flat: not flat    Anterior fontanelle open: not open    Bitemporal narrowing: forehead not narrow    Frontal bossing: no frontal bossing    Macrocephaly: not macrocephalic    Microcephaly: not microcephalic    Plagiocephaly: not plagiocephalic (comments: Slight prominence of parietal regions)  Face:    Coarse facial features: coarse facies  Ears: (comments: Small lobes, prominent antihelices)  Nose:    Bulbous nasal tip: prominent nasal tip  Mouth: (comments: Wide space between teeth)  Neck: No abnormalities detected in: neck    Cysts: no cysts    Pits: no pits in neck    Redundant nuchal skin: no redundant neck skin    Webbing: no webbed neck  Hair, Nails, and Skin: No abnormalities detected in: integumentary system, hair, nails or skin    Abnormally healed scars: no abnormally healed scars    Birthmarks: no birthmarks    Lesions: no lesions  Hands and Feet: (comments: Long fingers)   Photo of patient available (verbal consent obtained)   Jonathan Haldeman-Englert, MD [facility] Date: 12/01/2023 Time: 1430   Total time spent: 70 minutes Time  spent includes face to face and non-face to face care for the patient on the date of this encounter (history and physical, genetic counseling, coordination of care, data gathering and/or documentation as outlined).  Genetic counselor: Lambert Mody, MS, [facility]

## 2023-12-02 NOTE — Progress Notes (Signed)
 GENETIC COUNSELING NEW PATIENT EVALUATION Patient name: Jonathan Kirby DOB: 1986/08/25 Age: 38 y.o. MRN: [id number]  Referring Provider/Specialty: Lorenz Coaster, MD / Complex Care Date of Evaluation: 12/02/2023 Chief Complaint/Reason for Referral: multiple medical concerns   Brief Summary: Jonathan Kirby is a 38 y.o. male who presents today for an initial genetics evaluation for multiple medical concerns including autism spectrum disorder, intellectual disability, seizure disorder, and GI issues. He is accompanied by his mother and father (legal guardians) at today's visit.  Prior genetic testing has not been performed.   Family History: See pedigree obtained during today's visit under History->Family->Pedigree.  The family history was notable for the following:  Paternal Family History Father, 55 yo, with hypertension, diabetes, and prostate cancer diagnosed at 61 yo. 6 aunts and 5 uncles with hypertension and diabetes. Both grandparents deceased in their 90s/  Maternal Family History Mother, 71 yo, with hypertension and diabetes. Aunt, 79 yo, with Szary syndrome diagnosed at 63 yo. Uncle, 62 yo, with a stroke at 71 yo. 3 other aunts with hypertension and diabetes. Grandfather, deceased at 73 yo, with Alzheimer's disease diagnosed at 80 yo. Grandmother, deceased in her 22s from stage IV cancer of unknown origin.  Mother's ethnicity: African American Father's ethnicity: African American Consangunity: Denies   Prior Genetic testing: None  Genetic Counseling: Jonathan Kirby is a 38 y.o. male with multiple medical concerns including autism spectrum disorder, intellectual disability, seizure disorder, and GI issues.  Jonathan Kirby was first noted to have differences to his development at around 25 months of age when he was not making noise.  He continued to have speech delay throughout his life and currently says a few words spontaneously, though he has frequent echolalia and enjoys  singing.  Jonathan Kirby was evaluated by a developmental pediatrician at [facility] at 38 yo and diagnosed with autism spectrum disorder. Jonathan Kirby has also been diagnosed with focal epilepsy, though his seizures are under control with medication currently.  Jonathan Kirby has had significant GI issues including constipation and repeated obstructions and pseudo-obstructions. His mother reports the longest he has ever been discharged from the hospital due to GI concerns is 4 months before another event requiring hospitalization. In 2022, Jonathan Kirby had an ileostomy and now utilizes NG feeding due to his GI concerns and achalasia.  Genetic considerations were reviewed with the family. They are aware that we have over 20,000 genes, each with an important role in the body. All of the genes are packaged into structures called chromosomes. We have two copies of every chromosome- one that is inherited from each parent- and thus two copies of every gene. Given Jonathan Kirby's features, concern for a genetic cause of his symptoms has arisen. If a specific genetic abnormality can be identified, it may help provide further insight into prognosis, management, and recurrence risk.  At this time, there is no specific genetic diagnosis evident in [place]. Given his complicated medical and developmental history, a broad approach to genetic testing is recommended. Specifically, we recommend exome sequencing (ES).  Whole exome sequencing assesses all of the coding regions (exons) of the genes for any variants that could be associated with an individual's symptoms. Therefore, whole exome sequencing is recommended as a first tier test in those with congenital anomalies or intellectual/learning disabilities by the [employer] of Medical Genetics [facility] et al, 2021. PMID: 91478295).   The family is interested in pursuing this testing today and would not like to know of secondary findings as well. The consent form, possible  results (positive, negative, and variant  of uncertain significance), and expected timeline were reviewed. Parental samples will be submitted for comparison. Jonathan Kirby parents felt that collecting a sample from Jonathan Kirby today would not be successful, so a buccal sample kit was sent home with the family.  They also requested a blood sample kit to be mailed to the home for his home nurse to use if needed.  Parental buccal samples will also be collected at home.  All samples will be mailed to [facility] for [employer].  Results are expected within 1-2 months of sample submission, at which point we will reach out with more information.  We also discussed the GeneConnect research study, Jonathan Kirby will be withdrawn from this study due to concerns regarding eligibility.  Recommendations: [facility] [employer] [employer] Continue follow-up with other healthcare providers as recommended.  Date: 12/02/2023 Total time spent: 75 minutes Genetic Counselor-only time: 30 minutes  Time spent includes face to face and non-face to face care for the patient on the date of this encounter (history, genetic counseling, coordination of care, data gathering and/or documentation as outlined).   Lambert Mody MS [facility] Certified Genetic Counselor [facility] [employer]

## 2023-12-05 MED FILL — WATER FOR IRRIGATION, STERILE SOLUTION, SODIUM BICARBONATE (BULK) POWDER, METHYLCELLULOSE 1500CPS (BULK) 27.5 % TO 31.5 % (USP) POWDER, PANTOPRAZOLE 40 MG TABLET,DELAYED RELEASE: ORAL | 12 days supply | Qty: 480 | Fill #1

## 2023-12-12 ENCOUNTER — Encounter: Admit: 2023-12-12 | Discharge: 2023-12-13 | Payer: MEDICARE | Attending: Registered" | Primary: Registered"

## 2023-12-13 NOTE — Addendum Note (Signed)
 Addended by: HALDEMAN-ENGLERT, [place] on: 12/13/2023 08:52 AM   Modules accepted: Orders

## 2023-12-16 ENCOUNTER — Inpatient Hospital Stay: Admit: 2023-12-16 | Discharge: 2023-12-23 | Disposition: A | Discharge status: Home Health

## 2023-12-16 ENCOUNTER — Ambulatory Visit: Admit: 2023-12-16

## 2023-12-16 ENCOUNTER — Ambulatory Visit: Admit: 2023-12-16 | Discharge: 2023-12-23

## 2023-12-20 MED FILL — WATER FOR IRRIGATION, STERILE SOLUTION, SODIUM BICARBONATE (BULK) POWDER, METHYLCELLULOSE 1500CPS (BULK) 27.5 % TO 31.5 % (USP) POWDER, PANTOPRAZOLE 40 MG TABLET,DELAYED RELEASE: ORAL | 12 days supply | Qty: 480 | Fill #2

## 2023-12-23 MED ORDER — METHOCARBAMOL 500 MG TABLET
ORAL_TABLET | Freq: Four times a day (QID) | GASTROSTOMY | 0 refills | 8 days | Status: CP | PRN
Start: 2023-12-23 — End: ?
  Filled 2023-12-23: qty 30, 8d supply, fill #0

## 2023-12-28 NOTE — Progress Notes (Signed)
 Patient: Jonathan Kirby MRN: [id number] Sex: male DOB: Jun 03, 1986  Provider: Marny Sires, MD Location of Care: Pediatric Specialist- Pediatric Complex Care Note type: Routine return visit   This is a Pediatric Specialist E-Visit follow up consult provided via MyChart Jonathan Kirby and their parent/guardian Jonathan Kirby consented to an E-Visit consult today.  Location of patient: Jonathan Kirby is at home in [place], [place] Location of provider: Marny Sires, MD is at [facility] in [place], [place]  The following participants were involved in this E-Visit:  Marny Sires, MD, Verdia Glad, CMA, Jonathan Kirby, patient, and their parent/guardian Jonathan Kirby.   This visit was done via VIDEO    History of Present Illness: Referral Source: Carliss Chess, MD History from: patient and prior records Chief Complaint: complex care  Jonathan Kirby is a 38 y.o. male with history of autism and focal epilepsy who I am seeing in follow-up for complex care management. Patient was last seen on 10/06/2023 where I continued Felbatol , increased gabapentin , recommended increasing Klonopin  as needed for sleep, recommended discussing daily tylenol  use with GI doctor, referred to genetics. Since that appointment, patient was hospitalized on 12/16/2023 for decreased ostomy output and abdominal pain where they started on robaxin as needed for muscle spasms.   Patient presents today with mother who reports the following:   Symptom management:  Mother reports today that seizures have been well controlled, despite worsening GI motility and concern for lack of absorption.   Still requiring TPN.  Mother reports events of abdominal pain that she thinks are muscle spasms.    Behavior is worsening, mother is worried this is because of pain. Worsening "autistic behaviors". Less able to tolerate noise, social interaction.    Sleep is ok as long as she uses gabapentin , Klonopin , and tylenol  all together. She  tried to stop Klonopin  and he was awake. Also separately tried to stop tylenol .   Care coordination (other providers): Patient saw Dr. Cammie Cellar with [facility] on 10/07/2023 where she recommended supplemental enteral nutrition or oral intake, starting Mestinon or motegrity, and tapering parenteral nutrition. He saw Rodell Citrin, RD on 11/14/2023 and 12/12/2023 where she recommended a trial of 1 carton of Peptamen 1.5.   Patient saw Dr. Gabino Joe with [facility] internal medicine on 10/11/2023 where he started famotidine .   Patient saw Dr. Nelson Bandy with genetics on 12/01/2023 where he recommended parents consider trio exome sequencing.  Case management needs:  At the last appointment, considered referrals for [facility] in-home PCP and home health.   Equipment needs:   Decision making/Advanced care planning:  Diagnostics/Patient history:  He had a mixture of complex partial seizures with secondary generalization, and myoclonic seizures.  These were quite frequent, and caused significant impairment in his function.  Once we were able to bring seizures under control Felbatol , he showed a great deal of anxiety and depression.  This has improved as he has become older.  For the most part his seizures have been well controlled on Felbatol .  Most recurrent seizures have occurred in the setting of forgotten doses of medication.  He also has problems with constipation.    EEG 08/28/20 Impression:  This study is consistent with patient's known history of epilepsy, which could be fragments of generalized epilepsy or multifocal epilepsy.  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   06/26/2021 GD with FLIP: findings suggestive of Type 1 achalasia. 07/20/2021 CT of Abd, Pelvis & Chest:  no intraabdominal  pathology.  RLQ ileostomy, no obstruction, numerous decompressed small bowel loops with trace of mesenteric edema and engorged vasa  recta which can occur with inflammatory/infectious enteritis 07/23/2021 EGD with botox injection An area at the lower esophageal sphincter was injected with botox, 1 cm hiatal hernia was present, cardia and gastric fundus were normal on retroflexion, stomach was normal, examined duodenum was normal. No acute abnormality of the airways, lungs, pleura, or mediastinum. *Mild thickening of the distal esophagus is nonspecific but could be related to gastroesophageal reflux. 10/15/2021 CT of Abd: Mild thickening of the distal esophagus which is nonspecific, however can be seen in the setting of esophagitis. 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 10/16/2021 EGD with Corpak placement: no evidence of GI bleeding to explain melena, h/h has remained stable Gastric oxyntic mucosa with mild chronic inactive gastritis. Negative for Helicobacter organisms on H&E stain. 10/20/2021 U/S Liver Doppler: patent hepatic vasculature with normal flow direction. Cholelithiasis. 10/23/2021 Liver Biopsy and PEG tube placement  Past Medical History Past Medical History:  Diagnosis Date   Autism    GERD (gastroesophageal reflux disease)    Seizures [facility])     Surgical History Past Surgical History:  Procedure Laterality Date   bowel restrucion  04/2023   CIRCUMCISION  1987   ESOPHAGOGASTRODUODENOSCOPY N/A 04/17/2020   Procedure: ESOPHAGOGASTRODUODENOSCOPY (EGD);  Surgeon: Irby Mannan, MD;  [facility] ENDOSCOPY;  Service: Endoscopy;  Laterality: N/A;   ESOPHAGOGASTRODUODENOSCOPY (EGD) WITH PROPOFOL  N/A 08/28/2020   Procedure: ESOPHAGOGASTRODUODENOSCOPY (EGD) WITH PROPOFOL ;  Surgeon: Selena Daily, MD;  Location: [facility] ENDOSCOPY;  Service: Gastroenterology;  Laterality: N/A;   STOMA REVISION  08/2022   VOLVULUS REDUCTION  04/2023    Family History family history  includes Alzheimer's disease (age of onset: 97) in his maternal grandfather; Cancer (age of onset: 74) in his maternal grandmother; Diabetes in his father, maternal aunt, maternal aunt, maternal aunt, mother, paternal aunt, paternal aunt, paternal aunt, paternal aunt, paternal aunt, paternal aunt, paternal uncle, paternal uncle, paternal uncle, paternal uncle, and paternal uncle; Hypertension in his father, maternal aunt, maternal aunt, maternal aunt, mother, paternal aunt, paternal aunt, paternal aunt, paternal aunt, paternal aunt, paternal aunt, paternal uncle, paternal uncle, paternal uncle, paternal uncle, and paternal uncle; Lymphoma (age of onset: 17) in his maternal aunt; Prostate cancer (age of onset: 22) in his father; Stroke in his paternal grandfather; Stroke (age of onset: 41) in his maternal aunt.   Social History Social History   Social History Narrative   Alphonsa does not attend any school or day program at this time.    He lives with his parents.    He enjoys listening to music, playing with remote control cars, and collecting newspapers.    Allergies Allergies  Allergen Reactions   Lactose Intolerance (Gi)     Upset stomach, excessive gas   Vimpat [Lacosamide]     Elevated liver enzymes after starting TPN & Vimpat    Medications Current Outpatient Medications on File Prior to Visit  Medication Sig Dispense Refill   acetaminophen  (TYLENOL ) 160 MG/5ML liquid Place 20 mLs into feeding tube every 8 (eight) hours as needed for pain.     clonazePAM  (KLONOPIN ) 0.5 MG disintegrating tablet TAKE 1/2 TABLET DISSOLVE IN WATER AND EVERY 8 HOURS AS NEEDED FOR SEIZURES or SLEEP 45 tablet 3   famotidine  (PEPCID ) 40 MG/5ML suspension Take 2.5 mLs by mouth 2 (  two) times daily. For itching     felbamate  (FELBATOL ) 600 MG/5ML suspension TAKE 10MLS BY MOUTH TWICE DAILY 600 mL 5   gabapentin  (NEURONTIN ) 250 MG/5ML solution Take 2ml in morning and afternoon, and 6ml at night. 300 mL 12    methocarbamol (ROBAXIN) 500 MG tablet Place 500 mg into feeding tube every 6 (six) hours as needed for muscle spasms.     pantoprazole  (PROTONIX ) 2 mg/mL suspension Take 20 mLs by mouth 2 (two) times daily.     simethicone (MYLICON) 40 MG/0.6ML drops Take 40 mg by mouth every 6 (six) hours as needed.     No current facility-administered medications on file prior to visit.   The medication list was reviewed and reconciled. All changes or newly prescribed medications were explained.  A complete medication list was provided to the patient/caregiver.  Physical Exam Wt 105 lb (47.6 kg) Comment: Last weighted 4.12.2025  BMI 17.47 kg/m  Weight for age: Facility age limit for growth %iles is 20 years.  Length for age: Facility age limit for growth %iles is 20 years. BMI: Body mass index is 17.47 kg/m. No results found. Exam limited by virtual visit.  General: NAD, well nourished.  Holding fingers in ears.   HEENT: normocephalic. Cardiovascular: appears well perfused Lungs: Normal work of breathing Neuro: Awake, alert. Sitting independently on couch.    Diagnosis:  1. Partial epilepsy with impairment of consciousness (HCC)   2. Generalized convulsive epilepsy (HCC)   3. Autism spectrum disorder with accompanying language impairment and intellectual disability, requiring very substantial support   4. Ileus (HCC)   5. Visceral hyperalgesia   6. Insomnia, unspecified type      Assessment and Plan SALAM MICUCCI is a 38 y.o. male with history of autism and focal epilepsy who presents for follow-up in the pediatric complex care clinic. Patient's seizures are well controlled but continuing to have GI intolerance, which may be partially neurologic in origin, including visceral hyperalgesia, and now abdominal muscle spasms.  He is sleeping decently, but I would like him not to be taking daily Klonopin  and tylenol .  Discussed options for medications in depth to try to help spasms, sleep, and possible  motility.  Came to decision with mother that we will try baclofen , which should treat muscle spasms and can help with motility.  This will be a small dose, so mother to contact me if it is not working and we can increase.    Symptom management:  Start Baclofen  5mg  TID for abdominal muscle spasms Continue Klonopin  0.5mg , 1/2 to 1 tablet at night for sleep, for now Continue Felbamate  1200mg  BID for seizures Continue Gabapentin  100mg  in morning and afternoon, and 300mg  at night for visceral hyperalgesia  Care coordination: Discussed and encourage whole exome trio from genetics to better define potential syndrome including GI motility and autism.   Case management needs:  Reviewed community supports and DME providers, see care plan.   Equipment needs:  Due to patient's medical condition, patient is indefinitely incontinent of stool and urine.  It is medically necessary for them to use diapers, underpads, and gloves to assist with hygiene and skin integrity.  They require a frequency of up to 200 a month.  The CARE PLAN for reviewed and revised to represent the changes above.  This is available in Epic under snapshot, and a physical binder provided to the patient, that can be used for anyone providing care for the patient.    I spent 45 minutes on  day of service on this patient including review of chart, discussion with patient and family, coordination with other providers and management of orders and paperwork.    Return in about 3 months (around 04/05/2024).  Marny Sires MD MPH Neurology,  Neurodevelopment and Neuropalliative care [facility] Child [facility]  [address] [place], [place], [place] [address] Phone: [phone]

## 2024-01-02 MED FILL — WATER FOR IRRIGATION, STERILE SOLUTION, SODIUM BICARBONATE (BULK) POWDER, METHYLCELLULOSE 1500CPS (BULK) 27.5 % TO 31.5 % (USP) POWDER, PANTOPRAZOLE 40 MG TABLET,DELAYED RELEASE: ORAL | 12 days supply | Qty: 480 | Fill #3

## 2024-01-05 ENCOUNTER — Telehealth (INDEPENDENT_AMBULATORY_CARE_PROVIDER_SITE_OTHER): Payer: Medicare HMO | Admitting: Pediatrics

## 2024-01-05 ENCOUNTER — Encounter (INDEPENDENT_AMBULATORY_CARE_PROVIDER_SITE_OTHER): Payer: Self-pay | Admitting: Pediatrics

## 2024-01-05 VITALS — Wt 105.0 lb

## 2024-01-05 DIAGNOSIS — G40309 Generalized idiopathic epilepsy and epileptic syndromes, not intractable, without status epilepticus: Secondary | ICD-10-CM | POA: Diagnosis not present

## 2024-01-05 DIAGNOSIS — G40209 Localization-related (focal) (partial) symptomatic epilepsy and epileptic syndromes with complex partial seizures, not intractable, without status epilepticus: Secondary | ICD-10-CM

## 2024-01-05 DIAGNOSIS — K567 Ileus, unspecified: Secondary | ICD-10-CM

## 2024-01-05 DIAGNOSIS — F840 Autistic disorder: Secondary | ICD-10-CM | POA: Diagnosis not present

## 2024-01-05 DIAGNOSIS — R198 Other specified symptoms and signs involving the digestive system and abdomen: Secondary | ICD-10-CM

## 2024-01-05 DIAGNOSIS — G4700 Insomnia, unspecified: Secondary | ICD-10-CM

## 2024-01-05 MED ORDER — BACLOFEN 25 MG/5ML PO SUSP: 5.0000 mg | Freq: Three times a day (TID) | ORAL | 3 refills | Status: DC

## 2024-01-09 ENCOUNTER — Telehealth (INDEPENDENT_AMBULATORY_CARE_PROVIDER_SITE_OTHER): Payer: Self-pay | Admitting: Pharmacy Technician

## 2024-01-09 ENCOUNTER — Other Ambulatory Visit (HOSPITAL_COMMUNITY): Payer: Self-pay

## 2024-01-09 NOTE — Telephone Encounter (Signed)
 Pharmacy Patient Advocate Encounter   Received notification from CoverMyMeds that prior authorization for Baclofen  25MG /5ML suspension is required/requested.   Insurance verification completed.   The patient is insured through [employer] .  Key: [id number]   Prior Authorization form/request asks a question that requires your assistance. Please see the question below and advise accordingly. The PA will not be submitted until the necessary information is received.   **I am unable to find chart notes and the diagnosis code to submit with the PA. Please advise.**

## 2024-01-11 ENCOUNTER — Ambulatory Visit
Admit: 2024-01-11 | Discharge: 2024-01-12 | Payer: MEDICARE | Attending: Vascular & Interventional Radiology | Primary: Vascular & Interventional Radiology

## 2024-01-11 DIAGNOSIS — R222 Localized swelling, mass and lump, trunk: Principal | ICD-10-CM

## 2024-01-12 ENCOUNTER — Ambulatory Visit: Admit: 2024-01-12 | Discharge: 2024-01-13 | Payer: MEDICARE

## 2024-01-12 DIAGNOSIS — K50818 Crohn's disease of both small and large intestine with other complication: Principal | ICD-10-CM

## 2024-01-12 DIAGNOSIS — R197 Diarrhea, unspecified: Principal | ICD-10-CM

## 2024-01-13 ENCOUNTER — Other Ambulatory Visit (HOSPITAL_COMMUNITY): Payer: Self-pay

## 2024-01-13 NOTE — Telephone Encounter (Signed)
 Clinical questions have been answered and PA submitted. PA currently Pending. Please be advised that most companies allow up to 30 days to make a decision. We will advise when a determination has been made, or follow up in 1 week.   Please reach out to our team, Rx Prior Auth Pool, if you haven't heard back in a week.   **I used epileptic spasms as the diagnosis, and made note that the can not swallow pills.**

## 2024-01-13 NOTE — Telephone Encounter (Signed)
 Pharmacy Patient Advocate Encounter  Received notification from [employer] that Prior Authorization for Baclofen  25MG /5ML suspension has been APPROVED from 09/21/2023 to 09/19/2024. Ran test claim, Copay is $0.00. This test claim was processed through [facility]- copay amounts may vary at other pharmacies due to pharmacy/plan contracts, or as the patient moves through the different stages of their insurance plan.   PA #/Case ID/Reference #: [id number]

## 2024-01-16 ENCOUNTER — Other Ambulatory Visit (INDEPENDENT_AMBULATORY_CARE_PROVIDER_SITE_OTHER): Payer: Self-pay | Admitting: Pediatrics

## 2024-01-16 ENCOUNTER — Other Ambulatory Visit (INDEPENDENT_AMBULATORY_CARE_PROVIDER_SITE_OTHER): Payer: Self-pay

## 2024-01-16 ENCOUNTER — Ambulatory Visit: Admit: 2024-01-16 | Discharge: 2024-01-17 | Payer: Medicare (Managed Care)

## 2024-01-16 DIAGNOSIS — G40209 Localization-related (focal) (partial) symptomatic epilepsy and epileptic syndromes with complex partial seizures, not intractable, without status epilepticus: Secondary | ICD-10-CM

## 2024-01-16 DIAGNOSIS — N62 Hypertrophy of breast: Principal | ICD-10-CM

## 2024-01-16 DIAGNOSIS — L299 Pruritus, unspecified: Principal | ICD-10-CM

## 2024-01-16 DIAGNOSIS — G40909 Epilepsy, unspecified, not intractable, without status epilepticus: Principal | ICD-10-CM

## 2024-01-16 DIAGNOSIS — Z95828 Presence of other vascular implants and grafts: Principal | ICD-10-CM

## 2024-01-16 MED ORDER — VALTOCO 20 MG DOSE 2 X 10 MG/0.1ML NA LQPK: NASAL | 5 refills | Status: DC

## 2024-01-16 MED FILL — WATER FOR IRRIGATION, STERILE SOLUTION, SODIUM BICARBONATE (BULK) POWDER, METHYLCELLULOSE 1500CPS (BULK) 27.5 % TO 31.5 % (USP) POWDER, PANTOPRAZOLE 40 MG TABLET,DELAYED RELEASE: ORAL | 12 days supply | Qty: 480 | Fill #4

## 2024-01-18 ENCOUNTER — Encounter
Admit: 2024-01-18 | Discharge: 2024-01-19 | Payer: Medicare (Managed Care) | Attending: Registered" | Primary: Registered"

## 2024-01-27 DIAGNOSIS — K5989 Chronic intestinal pseudo-obstruction: Principal | ICD-10-CM

## 2024-01-27 DIAGNOSIS — K580 Irritable bowel syndrome with diarrhea: Principal | ICD-10-CM

## 2024-01-27 MED ORDER — RIFAXIMIN 20 MG/ML ORAL SUSPENSION WAM
Freq: Three times a day (TID) | GASTROSTOMY | 0 refills | 14.00000 days | Status: CP
Start: 2024-01-27 — End: 2024-02-10

## 2024-01-28 ENCOUNTER — Ambulatory Visit: Admit: 2024-01-28 | Discharge: 2024-02-17 | Payer: Medicare (Managed Care)

## 2024-01-28 ENCOUNTER — Ambulatory Visit: Admit: 2024-01-28 | Payer: Medicare (Managed Care)

## 2024-01-28 ENCOUNTER — Encounter: Admit: 2024-01-28 | Payer: Medicare (Managed Care)

## 2024-01-28 ENCOUNTER — Inpatient Hospital Stay
Admit: 2024-01-28 | Discharge: 2024-02-17 | Disposition: A | Discharge status: Home Health | Payer: Medicare (Managed Care)

## 2024-01-28 ENCOUNTER — Ambulatory Visit: Payer: Medicare (Managed Care)

## 2024-01-30 ENCOUNTER — Telehealth: Payer: Self-pay | Admitting: Genetic Counselor

## 2024-01-30 DIAGNOSIS — K580 Irritable bowel syndrome with diarrhea: Principal | ICD-10-CM

## 2024-01-30 MED FILL — WATER FOR IRRIGATION, STERILE SOLUTION, SODIUM BICARBONATE (BULK) POWDER, METHYLCELLULOSE 1500CPS (BULK) 27.5 % TO 31.5 % (USP) POWDER, PANTOPRAZOLE 40 MG TABLET,DELAYED RELEASE: ORAL | 12 days supply | Qty: 480 | Fill #5

## 2024-01-30 NOTE — Progress Notes (Signed)
 Spoke with Kazumi's mother, Darlene Slavens, regarding the results of Khing's recent genetic testing.      Cotton was seen in the [facility] clinic on 12/01/2023 at 38 yo due to a personal history of intestinal pseudo-obstruction, autism spectrum disorder, intellectual disability, and epilepsy. After evaluation, genetic testing was ordered for [place] including exome sequencing.   The [facility] Exome Sequencing was negative/normal. At this time, we have not identified a genetic cause for Pinchos's symptoms. No changes to medical management or testing of other family members is recommended based on these results. Re-analysis of exome sequencing data may be considered 18-24 months after initial testing. The family is not interested in re-analysis.  Additionally, testing for Fragile X syndrome, a common cause of autism spectrum disorder in males, has not been completed.  Testing for this condition may also be considered by the family. The family is not interested in this additional testing.  Mrs. Calcagni expressed understanding of these results and was encouraged to reach out with any further questions. The test report has been released to the family and is attached to the associated order.      Carolynne Citron, MS [facility]   Certified Genetic Counselor

## 2024-02-10 ENCOUNTER — Ambulatory Visit: Admit: 2024-02-10 | Discharge: 2024-02-11 | Payer: Medicare (Managed Care)

## 2024-02-12 ENCOUNTER — Encounter (INDEPENDENT_AMBULATORY_CARE_PROVIDER_SITE_OTHER): Payer: Self-pay | Admitting: Pediatrics

## 2024-02-16 MED ORDER — FUROSEMIDE 20 MG TABLET
ORAL_TABLET | Freq: Every day | ORAL | 0 refills | 30.00000 days | PRN
Start: 2024-02-16 — End: 2024-03-17

## 2024-02-17 MED ORDER — FLUCONAZOLE 200 MG TABLET
ORAL_TABLET | Freq: Every day | GASTROSTOMY | 0 refills | 5.00000 days | Status: CP
Start: 2024-02-17 — End: 2024-02-22
  Filled 2024-02-17: qty 10, 5d supply, fill #0

## 2024-02-17 MED ORDER — FUROSEMIDE 20 MG TABLET
ORAL_TABLET | Freq: Every day | ORAL | 0 refills | 30.00000 days | Status: CP | PRN
Start: 2024-02-17 — End: 2024-03-18
  Filled 2024-02-17: qty 30, 30d supply, fill #0

## 2024-02-17 MED ORDER — PANTOPRAZOLE 2MG/ML SUSPENSION
Freq: Two times a day (BID) | ORAL | 0 refills | 30.00000 days | Status: CP
Start: 2024-02-17 — End: 2024-03-18
  Filled 2024-02-17: qty 560, 14d supply, fill #0

## 2024-02-23 ENCOUNTER — Emergency Department
Admit: 2024-02-23 | Discharge: 2024-02-23 | Disposition: A | Discharge status: Home / Self Care | Payer: Medicare (Managed Care)

## 2024-02-23 DIAGNOSIS — R509 Fever, unspecified: Principal | ICD-10-CM

## 2024-02-27 ENCOUNTER — Ambulatory Visit: Admit: 2024-02-27 | Discharge: 2024-02-28 | Payer: Medicare (Managed Care)

## 2024-02-27 DIAGNOSIS — Z9189 Other specified personal risk factors, not elsewhere classified: Principal | ICD-10-CM

## 2024-02-27 DIAGNOSIS — I358 Other nonrheumatic aortic valve disorders: Principal | ICD-10-CM

## 2024-02-27 DIAGNOSIS — B958 Unspecified staphylococcus as the cause of diseases classified elsewhere: Principal | ICD-10-CM

## 2024-02-27 DIAGNOSIS — I351 Nonrheumatic aortic (valve) insufficiency: Principal | ICD-10-CM

## 2024-02-27 MED ORDER — FLUCONAZOLE 10 MG/ML ORAL SUSPENSION
Freq: Every day | ORAL | 0 refills | 21.00000 days | Status: CP
Start: 2024-02-27 — End: 2024-03-19

## 2024-02-27 MED ORDER — PROCHLORPERAZINE MALEATE 5 MG TABLET
ORAL_TABLET | Freq: Three times a day (TID) | GASTROSTOMY | 0 refills | 10.00000 days | Status: CP | PRN
Start: 2024-02-27 — End: 2024-03-28

## 2024-02-28 ENCOUNTER — Encounter
Admit: 2024-02-28 | Payer: Medicare (Managed Care) | Attending: Hospice and Palliative Medicine | Primary: Hospice and Palliative Medicine

## 2024-02-29 MED ORDER — PANTOPRAZOLE 2MG/ML SUSPENSION
Freq: Two times a day (BID) | ORAL | 11 refills | 30.00000 days | Status: CP
Start: 2024-02-29 — End: 2025-02-23
  Filled 2024-02-29: qty 480, 12d supply, fill #0

## 2024-03-01 ENCOUNTER — Ambulatory Visit: Admit: 2024-03-01 | Discharge: 2024-03-12 | Payer: Medicare (Managed Care)

## 2024-03-01 ENCOUNTER — Ambulatory Visit: Admit: 2024-03-01 | Payer: Medicare (Managed Care)

## 2024-03-01 ENCOUNTER — Inpatient Hospital Stay
Admit: 2024-03-01 | Discharge: 2024-03-12 | Disposition: A | Discharge status: Home / Self Care | Payer: Medicare (Managed Care)

## 2024-03-11 MED ORDER — ONDANSETRON HCL 4 MG/5 ML ORAL SOLUTION
Freq: Three times a day (TID) | ORAL | 2 refills | 4.00000 days | PRN
Start: 2024-03-11 — End: 2024-06-09

## 2024-03-11 MED ORDER — PANTOPRAZOLE 2MG/ML SUSPENSION
Freq: Two times a day (BID) | ORAL | 2 refills | 30.00000 days
Start: 2024-03-11 — End: 2024-06-09

## 2024-03-11 MED ORDER — FUROSEMIDE 20 MG TABLET
ORAL_TABLET | GASTROSTOMY | 0 refills | 30.00000 days | PRN
Start: 2024-03-11 — End: 2024-04-10

## 2024-03-12 MED ORDER — FUROSEMIDE 20 MG TABLET
ORAL_TABLET | GASTROSTOMY | 0 refills | 30.00000 days | Status: CP | PRN
Start: 2024-03-12 — End: 2024-04-11
  Filled 2024-03-12: qty 30, 15d supply, fill #0

## 2024-03-12 MED ORDER — ONDANSETRON HCL 4 MG/5 ML ORAL SOLUTION
Freq: Three times a day (TID) | ORAL | 2 refills | 4.00000 days | Status: CP | PRN
Start: 2024-03-12 — End: 2024-06-10
  Filled 2024-03-12: qty 50, 4d supply, fill #0

## 2024-03-12 MED ORDER — PANTOPRAZOLE 2MG/ML SUSPENSION
Freq: Two times a day (BID) | ORAL | 2 refills | 30.00000 days | Status: CP
Start: 2024-03-12 — End: 2024-06-10
  Filled 2024-03-12: qty 540, 13d supply, fill #0

## 2024-03-12 MED ORDER — FUROSEMIDE 40 MG TABLET
ORAL_TABLET | GASTROSTOMY | 0 refills | 90.00000 days | Status: CP
Start: 2024-03-12 — End: 2024-06-10
  Filled 2024-03-12: qty 45, 90d supply, fill #0

## 2024-03-14 DIAGNOSIS — I358 Other nonrheumatic aortic valve disorders: Principal | ICD-10-CM

## 2024-03-14 DIAGNOSIS — I351 Nonrheumatic aortic (valve) insufficiency: Principal | ICD-10-CM

## 2024-03-19 ENCOUNTER — Ambulatory Visit (INDEPENDENT_AMBULATORY_CARE_PROVIDER_SITE_OTHER): Admitting: Pediatrics

## 2024-03-19 DIAGNOSIS — B958 Unspecified staphylococcus as the cause of diseases classified elsewhere: Principal | ICD-10-CM

## 2024-03-19 DIAGNOSIS — B3781 Candidal esophagitis: Principal | ICD-10-CM

## 2024-03-19 DIAGNOSIS — I358 Other nonrheumatic aortic valve disorders: Principal | ICD-10-CM

## 2024-03-19 DIAGNOSIS — Z9189 Other specified personal risk factors, not elsewhere classified: Principal | ICD-10-CM

## 2024-03-19 MED ORDER — CEPHALEXIN 250 MG/5 ML ORAL SUSPENSION
Freq: Three times a day (TID) | ORAL | 6 refills | 20.00000 days | Status: CP
Start: 2024-03-19 — End: ?

## 2024-03-22 DIAGNOSIS — J690 Pneumonitis due to inhalation of food and vomit: Principal | ICD-10-CM

## 2024-03-22 DIAGNOSIS — R7989 Other specified abnormal findings of blood chemistry: Principal | ICD-10-CM

## 2024-03-22 DIAGNOSIS — Z789 Other specified health status: Principal | ICD-10-CM

## 2024-03-22 MED FILL — WATER FOR IRRIGATION, STERILE SOLUTION, SODIUM BICARBONATE (BULK) POWDER, METHYLCELLULOSE 1500CPS (BULK) 27.5 % TO 31.5 % (USP) POWDER, PANTOPRAZOLE 40 MG TABLET,DELAYED RELEASE: ORAL | 10 days supply | Qty: 400 | Fill #1

## 2024-03-23 ENCOUNTER — Emergency Department: Admit: 2024-03-23 | Discharge: 2024-03-23 | Disposition: A | Discharge status: Home / Self Care | Payer: MEDICARE

## 2024-03-23 ENCOUNTER — Ambulatory Visit: Admit: 2024-03-23 | Discharge: 2024-03-23 | Disposition: A | Discharge status: Home / Self Care | Payer: MEDICARE

## 2024-03-23 ENCOUNTER — Emergency Department
Admit: 2024-03-23 | Discharge: 2024-03-23 | Disposition: A | Discharge status: Home / Self Care | Payer: Medicare (Managed Care)

## 2024-03-24 DIAGNOSIS — R7989 Other specified abnormal findings of blood chemistry: Principal | ICD-10-CM

## 2024-03-26 ENCOUNTER — Ambulatory Visit: Admit: 2024-03-26 | Discharge: 2024-03-27 | Payer: Medicare (Managed Care)

## 2024-03-29 DIAGNOSIS — B958 Unspecified staphylococcus as the cause of diseases classified elsewhere: Principal | ICD-10-CM

## 2024-03-29 MED ORDER — CEPHALEXIN 500 MG CAPSULE
ORAL_CAPSULE | Freq: Three times a day (TID) | GASTROSTOMY | 11 refills | 30.00000 days | Status: CP
Start: 2024-03-29 — End: 2025-03-24

## 2024-03-29 MED ORDER — CEPHALEXIN 500 MG TABLET
ORAL_TABLET | Freq: Three times a day (TID) | GASTROSTOMY | 11 refills | 30.00000 days | Status: CP
Start: 2024-03-29 — End: 2024-03-29

## 2024-04-02 MED FILL — WATER FOR IRRIGATION, STERILE SOLUTION, SODIUM BICARBONATE (BULK) POWDER, METHYLCELLULOSE 1500CPS (BULK) 27.5 % TO 31.5 % (USP) POWDER, PANTOPRAZOLE 40 MG TABLET,DELAYED RELEASE: ORAL | 12 days supply | Qty: 480 | Fill #2

## 2024-04-16 MED FILL — WATER FOR IRRIGATION, STERILE SOLUTION, SODIUM BICARBONATE (BULK) POWDER, METHYLCELLULOSE 1500CPS (BULK) 27.5 % TO 31.5 % (USP) POWDER, PANTOPRAZOLE 40 MG TABLET,DELAYED RELEASE: ORAL | 12 days supply | Qty: 480 | Fill #3

## 2024-04-19 ENCOUNTER — Ambulatory Visit: Admit: 2024-04-19 | Discharge: 2024-04-20 | Payer: Medicare (Managed Care)

## 2024-04-24 DIAGNOSIS — Z789 Other specified health status: Principal | ICD-10-CM

## 2024-04-24 DIAGNOSIS — I358 Other nonrheumatic aortic valve disorders: Principal | ICD-10-CM

## 2024-04-24 DIAGNOSIS — B958 Unspecified staphylococcus as the cause of diseases classified elsewhere: Principal | ICD-10-CM

## 2024-04-24 DIAGNOSIS — B3781 Candidal esophagitis: Principal | ICD-10-CM

## 2024-04-24 DIAGNOSIS — Z9189 Other specified personal risk factors, not elsewhere classified: Principal | ICD-10-CM

## 2024-04-24 MED ORDER — BIOTENE ORALBALANCE (GLYCERIN) MUCOSAL GEL
Freq: Every evening | 2 refills | 84.00000 days | Status: CP | PRN
Start: 2024-04-24 — End: ?

## 2024-04-24 MED ORDER — CEPHALEXIN 500 MG CAPSULE
ORAL_CAPSULE | Freq: Two times a day (BID) | GASTROSTOMY | 11 refills | 30.00000 days | Status: CP
Start: 2024-04-24 — End: 2025-04-19

## 2024-04-24 MED ORDER — CLOTRIMAZOLE 10 MG TROCHE
ORAL_TABLET | Freq: Every day | ORAL | 1 refills | 28.00000 days | Status: CP | PRN
Start: 2024-04-24 — End: ?

## 2024-04-24 MED ORDER — DICLOXACILLIN 500 MG CAPSULE
ORAL_CAPSULE | Freq: Two times a day (BID) | ORAL | 30.00000 days
Start: 2024-04-24 — End: ?

## 2024-04-25 ENCOUNTER — Ambulatory Visit: Admit: 2024-04-25 | Discharge: 2024-04-30 | Payer: Medicare (Managed Care)

## 2024-04-25 ENCOUNTER — Encounter
Admit: 2024-04-25 | Discharge: 2024-04-30 | Disposition: A | Discharge status: Hospice | Payer: Medicare (Managed Care) | Admitting: Allergy

## 2024-04-25 ENCOUNTER — Inpatient Hospital Stay
Admit: 2024-04-25 | Discharge: 2024-04-30 | Disposition: A | Discharge status: Hospice | Payer: Medicare (Managed Care) | Admitting: Allergy

## 2024-04-25 MED ORDER — ONDANSETRON 4 MG DISINTEGRATING TABLET
ORAL_TABLET | Freq: Two times a day (BID) | 1 refills | 15.00000 days | Status: CP | PRN
Start: 2024-04-25 — End: ?

## 2024-04-27 MED ORDER — FUROSEMIDE 40 MG TABLET
ORAL_TABLET | GASTROSTOMY | 0 refills | 90.00000 days
Start: 2024-04-27 — End: 2024-07-26

## 2024-04-30 ENCOUNTER — Ambulatory Visit (INDEPENDENT_AMBULATORY_CARE_PROVIDER_SITE_OTHER): Admitting: Pediatrics

## 2024-04-30 MED ORDER — FUROSEMIDE 40 MG TABLET
ORAL_TABLET | GASTROSTOMY | 0 refills | 90.00000 days | Status: CP
Start: 2024-04-30 — End: 2024-07-29

## 2024-04-30 MED FILL — WATER FOR IRRIGATION, STERILE SOLUTION, SODIUM BICARBONATE (BULK) POWDER, METHYLCELLULOSE 1500CPS (BULK) 27.5 % TO 31.5 % (USP) POWDER, PANTOPRAZOLE 40 MG TABLET,DELAYED RELEASE: ORAL | 12 days supply | Qty: 480 | Fill #4

## 2024-05-21 DEATH — deceased

## 2024-06-04 ENCOUNTER — Ambulatory Visit (INDEPENDENT_AMBULATORY_CARE_PROVIDER_SITE_OTHER): Admitting: Pediatrics
